# Patient Record
Sex: Male | Born: 1950 | Race: White | Hispanic: No | Marital: Married | Smoking: Never smoker
Health system: Southern US, Community
[De-identification: ages and names within clinical notes are randomized; demographics above are authoritative.]

## PROBLEM LIST (undated history)

## (undated) DIAGNOSIS — M109 Gout, unspecified: Secondary | ICD-10-CM

## (undated) DIAGNOSIS — G4730 Sleep apnea, unspecified: Secondary | ICD-10-CM

## (undated) DIAGNOSIS — M1990 Unspecified osteoarthritis, unspecified site: Secondary | ICD-10-CM

## (undated) DIAGNOSIS — I10 Essential (primary) hypertension: Secondary | ICD-10-CM

## (undated) DIAGNOSIS — J45909 Unspecified asthma, uncomplicated: Secondary | ICD-10-CM

## (undated) DIAGNOSIS — I499 Cardiac arrhythmia, unspecified: Secondary | ICD-10-CM

## (undated) DIAGNOSIS — E7800 Pure hypercholesterolemia, unspecified: Secondary | ICD-10-CM

## (undated) DIAGNOSIS — K5730 Diverticulosis of large intestine without perforation or abscess without bleeding: Secondary | ICD-10-CM

## (undated) DIAGNOSIS — E119 Type 2 diabetes mellitus without complications: Secondary | ICD-10-CM

## (undated) DIAGNOSIS — A809 Acute poliomyelitis, unspecified: Secondary | ICD-10-CM

## (undated) DIAGNOSIS — Z87442 Personal history of urinary calculi: Secondary | ICD-10-CM

## (undated) DIAGNOSIS — I4891 Unspecified atrial fibrillation: Secondary | ICD-10-CM

## (undated) DIAGNOSIS — R569 Unspecified convulsions: Secondary | ICD-10-CM

## (undated) DIAGNOSIS — G4733 Obstructive sleep apnea (adult) (pediatric): Secondary | ICD-10-CM

## (undated) HISTORY — DX: Cardiac arrhythmia, unspecified: I49.9

## (undated) HISTORY — DX: Unspecified atrial fibrillation: I48.91

## (undated) HISTORY — PX: OTHER SURGICAL HISTORY: SHX169

## (undated) HISTORY — DX: Type 2 diabetes mellitus without complications: E11.9

## (undated) HISTORY — DX: Essential (primary) hypertension: I10

## (undated) HISTORY — DX: Obstructive sleep apnea (adult) (pediatric): G47.33

---

## 2006-09-10 ENCOUNTER — Ambulatory Visit (HOSPITAL_COMMUNITY): Admission: RE | Admit: 2006-09-10 | Discharge: 2006-09-11 | Payer: Self-pay | Admitting: Specialist

## 2009-01-29 HISTORY — PX: SHOULDER ARTHROSCOPY: SHX128

## 2009-01-29 HISTORY — PX: TOTAL KNEE ARTHROPLASTY: SHX125

## 2009-09-12 ENCOUNTER — Ambulatory Visit
Admission: RE | Admit: 2009-09-12 | Discharge: 2009-09-12 | Payer: Self-pay | Source: Home / Self Care | Admitting: Specialist

## 2010-02-20 LAB — URINALYSIS, ROUTINE W REFLEX MICROSCOPIC
Bilirubin Urine: NEGATIVE
Hgb urine dipstick: NEGATIVE
Ketones, ur: NEGATIVE mg/dL
Nitrite: NEGATIVE
Specific Gravity, Urine: 1.018 (ref 1.005–1.030)
Urobilinogen, UA: 1 mg/dL (ref 0.0–1.0)
pH: 5.5 (ref 5.0–8.0)

## 2010-02-20 LAB — CBC
HCT: 40.4 % (ref 39.0–52.0)
Hemoglobin: 14.3 g/dL (ref 13.0–17.0)
MCH: 31.3 pg (ref 26.0–34.0)
MCV: 88.4 fL (ref 78.0–100.0)
RBC: 4.57 MIL/uL (ref 4.22–5.81)
WBC: 7.3 10*3/uL (ref 4.0–10.5)

## 2010-02-20 LAB — PROTIME-INR: INR: 1.02 (ref 0.00–1.49)

## 2010-02-20 LAB — COMPREHENSIVE METABOLIC PANEL
Albumin: 4 g/dL (ref 3.5–5.2)
Alkaline Phosphatase: 57 U/L (ref 39–117)
BUN: 20 mg/dL (ref 6–23)
CO2: 28 mEq/L (ref 19–32)
Chloride: 101 mEq/L (ref 96–112)
Creatinine, Ser: 1.02 mg/dL (ref 0.4–1.5)
GFR calc non Af Amer: >60 mL/min (ref >60)
Potassium: 3.9 mEq/L (ref 3.5–5.1)
Total Bilirubin: 0.3 mg/dL (ref 0.3–1.2)

## 2010-02-20 LAB — DIFFERENTIAL
Basophils Relative: 0 % (ref 0–1)
Eosinophils Relative: 3 % (ref 0–5)
Lymphocytes Relative: 30 % (ref 12–46)
Monocytes Relative: 6 % (ref 3–12)
Neutro Abs: 4.5 10*3/uL (ref 1.7–7.7)
Neutrophils Relative %: 61 % (ref 43–77)

## 2010-02-20 LAB — SURGICAL PCR SCREEN: MRSA, PCR: NEGATIVE

## 2010-02-20 LAB — APTT: aPTT: 24 seconds (ref 24–37)

## 2010-02-23 ENCOUNTER — Inpatient Hospital Stay (HOSPITAL_COMMUNITY)
Admission: RE | Admit: 2010-02-23 | Discharge: 2010-02-26 | Payer: Self-pay | Source: Home / Self Care | Attending: Specialist | Admitting: Specialist

## 2010-02-23 LAB — TYPE AND SCREEN: ABO/RH(D): A NEG

## 2010-02-23 LAB — GLUCOSE, CAPILLARY
Glucose-Capillary: 165 mg/dL — ABNORMAL HIGH (ref 70–99)
Glucose-Capillary: 156 mg/dL — ABNORMAL HIGH (ref 70–99)
Glucose-Capillary: 180 mg/dL — ABNORMAL HIGH (ref 70–99)

## 2010-02-23 LAB — ABO/RH: ABO/RH(D): A NEG

## 2010-02-24 LAB — GLUCOSE, CAPILLARY
Glucose-Capillary: 169 mg/dL — ABNORMAL HIGH (ref 70–99)
Glucose-Capillary: 187 mg/dL — ABNORMAL HIGH (ref 70–99)
Glucose-Capillary: 160 mg/dL — ABNORMAL HIGH (ref 70–99)

## 2010-02-24 LAB — BASIC METABOLIC PANEL
CO2: 29 mEq/L (ref 19–32)
Chloride: 97 mEq/L (ref 96–112)
GFR calc Af Amer: >60 mL/min (ref >60)
Glucose, Bld: 186 mg/dL — ABNORMAL HIGH (ref 70–99)
Potassium: 3.8 mEq/L (ref 3.5–5.1)
Sodium: 135 mEq/L (ref 135–145)

## 2010-02-24 LAB — CBC
HCT: 38 % — ABNORMAL LOW (ref 39.0–52.0)
Hemoglobin: 13.1 g/dL (ref 13.0–17.0)
RBC: 4.27 MIL/uL (ref 4.22–5.81)

## 2010-02-24 LAB — PROTIME-INR: INR: 1.1 (ref 0.00–1.49)

## 2010-02-25 LAB — CBC
HCT: 36.4 % — ABNORMAL LOW (ref 39.0–52.0)
Hemoglobin: 12.8 g/dL — ABNORMAL LOW (ref 13.0–17.0)
MCHC: 35.2 g/dL (ref 30.0–36.0)
MCV: 88.3 fL (ref 78.0–100.0)

## 2010-02-25 LAB — GLUCOSE, CAPILLARY
Glucose-Capillary: 353 mg/dL — ABNORMAL HIGH (ref 70–99)
Glucose-Capillary: 164 mg/dL — ABNORMAL HIGH (ref 70–99)
Glucose-Capillary: 152 mg/dL — ABNORMAL HIGH (ref 70–99)

## 2010-02-25 LAB — BASIC METABOLIC PANEL
GFR calc Af Amer: >60 mL/min (ref >60)
GFR calc non Af Amer: >60 mL/min (ref >60)
Glucose, Bld: 178 mg/dL — ABNORMAL HIGH (ref 70–99)
Potassium: 3.3 mEq/L — ABNORMAL LOW (ref 3.5–5.1)
Sodium: 132 mEq/L — ABNORMAL LOW (ref 135–145)

## 2010-02-25 LAB — PROTIME-INR: Prothrombin Time: 17.1 seconds — ABNORMAL HIGH (ref 11.6–15.2)

## 2010-02-26 LAB — CBC
MCH: 30.7 pg (ref 26.0–34.0)
MCHC: 35.1 g/dL (ref 30.0–36.0)
Platelets: 152 10*3/uL (ref 150–400)

## 2010-02-26 LAB — PROTIME-INR: Prothrombin Time: 18.4 seconds — ABNORMAL HIGH (ref 11.6–15.2)

## 2010-02-26 LAB — BASIC METABOLIC PANEL
BUN: 17 mg/dL (ref 6–23)
CO2: 30 mEq/L (ref 19–32)
Calcium: 8.5 mg/dL (ref 8.4–10.5)
GFR calc non Af Amer: 53 mL/min — ABNORMAL LOW (ref >60)
Glucose, Bld: 170 mg/dL — ABNORMAL HIGH (ref 70–99)

## 2010-03-06 NOTE — Op Note (Signed)
NAME:  Miguel Cox, Miguel Cox NO.:  [id number]  MEDICAL RECORD NO.:  [id number]          PATIENT TYPE:  INP  LOCATION:  0003                         FACILITY:  [facility]  PHYSICIAN:  Jene Every, M.D.    DATE OF BIRTH:  12/16/1950  DATE OF PROCEDURE:  02/23/2010 DATE OF DISCHARGE:                              OPERATIVE REPORT   PREOPERATIVE DIAGNOSIS:  Degenerative disk disease, left knee.  POSTOPERATIVE DIAGNOSIS:  Degenerative disk disease, left knee.  PROCEDURE PERFORMED:  Left total knee arthroplasty.  ANESTHESIA:  General.  ASSISTANT:  Roma Schanz, P.A.  COMPONENTS:  Dupuy rotating platform for femur 5 tibia revision long stem tray, 10-mm insert, 38 patella.  BRIEF HISTORY:  DJD end-stage, varus deformity at 59, failing conservative treatment, indicated for total knee.  Risk and benefits were discussed including bleeding, infection, DVT, PE, suboptimal range of motion, need for revision, anesthetic complications, etc.  DESCRIPTION OF PROCEDURE:  The patient in supine position after induction of adequate anesthesia, 1 g vancomycin.  He had received 2 g of Kefzol.  Left lower extremity prepped and draped and exsanguinated in usual sterile fashion.  Thigh tourniquet inflated to 325 mmHg.  Knee was flexed.  Midline incision made, full thickness flaps, medial parapatellar arthrotomy.  Patella everted.  Elevated soft tissues medially preserving the medial collateral ligament attachment.  Flexed the knee.  Tricompartmental osteoarthrosis is noted.  Medial lateral menisci remnants removed as was the ACL.  Step drill utilized to enter the femoral canal, irrigated, 5-degree left was placed, 10 mm off the distal femur.  Oscillating saw utilized to perform a cut.  Soft tissue protected at all times.  Then measured off the anterior cortex to a 4. This was then marked, and the distal femoral cutting block was then applied.  Anterior-posterior chamfer cuts  performed with soft tissues well protected.  He is a large individual, although we protected the soft tissues at all times.  Attention turned towards the tibia.  Tibia was flexed.  Mchale was utilized.  External alignment guide was utilized bisecting the ankle 0-degree slope, 4 off the medial tibial plateau which was the defect on the medial third of tibial tubercle.  Pan oscillating saw utilized, performed a tibial cut.  We checked flexion/extension gaps, they were equivalent at 10 mm.  Checked posteriorly and removed the remnants of medial lateral meniscus cauterizing the geniculates.  Attention turned towards completing the tibia, it was subluxed.  Knee was flexed.  Placed a 4 and then a 5, which optimally covered the tibia just medial to the tibial tubercle. Center drill was utilized, punch guide applied as well for the long stem.  Next, attention turned towards completing the femur.  Box cut on the distal femur bisecting the notch and the condyles, pinned, box cut performed, rasped.  Used a trial femur and then a 10 mm insert.  We had full extension, full flexion, good stability to varus and valgus stress at 0 and 30 degrees.  Good patellofemoral tracking.  Then turned our attention towards the patella, 25 mm in thickness, selected a 38, removed 9, 15 mm remaining.  Utilizing  a planer saw, the peg holes were drilled.  Trial patella placed.  Good patellofemoral tracking.  All instrumentation was then removed.  Pulsatile lavage in the joint.  Knee was then flexed, dried all surfaces.  Cement mixed in the back table. It was injected under pressure in the tibial canal and digitally pressurized, 5 tray long stem impacted in the tibia, 4 of femur.  Cement impacted on the femur, 10 mm trial insert.  Knee was held in extension. Axial load applied.  Redundant cement removed.  The patella was cemented.  Held the axial load throughout the curing of the cement. After curing of the cement, good  flexion, good extension, good stability to varus and valgus stress at 0 and 30 degrees.  Selected a 10 insert. We flexed it with the trial removed and meticulously removed all redundant cement.  The patient was fairly large, somewhat tight, and this was performed.  Wound copiously irrigated with antibiotic irrigation and regular irrigation, low-pressure pulsatile lavage. Inserted a 10-mm permanent insert.  Good flexion, good extension and good stability to varus valgus stressing at 0 and 30 degrees.  Negative anterior drawer.  Stable device.  Next, Hemovac was placed and brought out through a lateral stab wound in the skin.  Marcaine with epinephrine was placed in the wound, injected in the periosteum.  Repaired the patellar arthrotomy with #1 Vicryl interrupted figure-of-8 sutures, subcu with 2-0 Vicryl simple sutures.  Skin was reapproximated with staples.  Working without difficulty, good flexion to 90 degrees against gravity.  Wound was dressed sterilely.  Tourniquet was deflated at 2 hours on left lower extremity.  Good revascularization noted.  The patient tolerated procedure well with no complications.  Tourniquet time was 2 hours.     Jene Every, M.D.     Cordelia Pen  D:  02/23/2010  T:  02/23/2010  Job:  [id number]  Electronically Signed by Jene Every M.D. on 03/06/2010 02:15:18 PM

## 2010-03-06 NOTE — H&P (Signed)
NAME:  Miguel Cox, Miguel Cox NO.:  [id number]  MEDICAL RECORD NO.:  [id number]          PATIENT TYPE:  INP  LOCATION:  NA                           FACILITY:  [facility]  PHYSICIAN:  Jene Every, M.D.    DATE OF BIRTH:  Aug 10, 1950  DATE OF ADMISSION:  02/23/2010 DATE OF DISCHARGE:                             HISTORY & PHYSICAL   CHIEF COMPLAINT:  Left knee pain.  HISTORY:  Mr. Reeder is a pleasant 60 year old gentleman who has had fairly symptomatic knee pain since early in the fall.  He underwent a knee arthroscopy which showed grade 3-4 changes most notable along the medial compartment as well as patella.  The patient noted persistent pain following the arthroscopy and actually underwent EFLEX series with no relief of the symptoms.  He describes his pain pattern as disabling, again with grade 3-4 changes noted on arthroscopy, so this time the patient would benefit from total knee arthroplasty.  The risks and benefits of this were discussed with the patient, and he would like to proceed.  PAST MEDICAL HISTORY:  Significant for: 1. Hypertension. 2. Hypercholesteremia. 3. Asthma. 4. Diet-controlled diabetes. 5. Osteoarthritis. 6. Gout.  CURRENT MEDICATIONS:  Include: 1. Norco 7.5. 2. Amlodipine 10 mg daily. 3. Colchicine p.r.n. 4. Losartan and HCTZ 100/25 daily. 5. Metoprolol 100 mg b.i.d. 6. Diclofenac p.r.n. 7. Simvastatin 80 mg daily. 8. Diovan HCT 160/25 daily. 9. Aspirin.  ALLERGIES:  PENICILLIN.  The patient gets no relief with morphine.  PAST SURGICAL HISTORY: 1. Kidney stone ablation in 2000. 2. Right rotator cuff repair in 2008. 3. Left knee arthroscopy.  SOCIAL HISTORY:  The patient is married.  History of no tobacco or alcohol.  Wife will be caregiver following surgery.  Dr. Abigail Miyamoto is his primary care.  He did give medical clearance.  FAMILY HISTORY:  Father and brother deceased in an MVA.  Mother with history of cancer.  Daughter has  MS.  REVIEW OF SYSTEMS:  GENERAL:  The patient denies any fever, chills, night sweats or bleeding tendencies.  CNS:  No blurred or double vision, seizure, headache or paralysis.  RESPIRATORY:  No shortness of breath, productive cough or hemoptysis.  CARDIOVASCULAR:  Chest pain.  No history orthopnea.  GU:  No dysuria, hematuria or discharge.  GI:  No nausea, vomiting or diarrhea except with taking colchicine.  No melena. MUSCULOSKELETAL:  As per HPI.  PHYSICAL EXAMINATION:  VITAL SIGNS:  Pulse 66, respiratory rate 10, BP 146/86. GENERAL:  This is a an overweight gentleman sitting upright in no acute distress.  He does walk with slight antalgic gait utilizing a cane. HEENT:  Atraumatic, normocephalic.  Pupils equal, round and reactive to light.  EOMs intact. NECK:  Supple.  No lymphadenopathy. CHEST:  Clear to auscultation bilaterally.  No rhonchi, wheezes or rales. HEART:  Regular rate and rhythm without murmurs, gallops or rubs. ABDOMEN:  Protuberant, soft and nontender.  Bowel sounds x4. SKIN:  No rashes or lesions noted. EXTREMITIES:  In regard to the knee, the patient does have varus deformity.  He does note loss of range of motion, especially flexion. He is  tender along the medial joint line, there is mild diffusion.  IMPRESSION:  Degenerative joint disease, left knee.  PLAN:  The patient will be admitted to undergo a left total knee arthroplasty.     Roma Schanz, P.A.   ______________________________ Jene Every, M.D.    CS/MEDQ  D:  02/17/2010  T:  02/17/2010  Job:  [id number]  Electronically Signed by Roma Schanz P.A. on 02/27/2010 12:41:10 PM Electronically Signed by Jene Every M.D. on 03/06/2010 02:15:16 PM

## 2010-03-20 NOTE — Discharge Summary (Signed)
NAMESTRUMMER, CANIPE NO.:  [id number]  MEDICAL RECORD NO.:  [id number]          PATIENT TYPE:  INP  LOCATION:  1615                         FACILITY:  [facility]  PHYSICIAN:  Jene Every, M.D.    DATE OF BIRTH:  [birth year]  DATE OF ADMISSION:  02/23/2010 DATE OF DISCHARGE:  02/26/2010                              DISCHARGE SUMMARY   ADMISSION DIAGNOSES: 1. Degenerative joint disease, left knee. 2. Hypertension. 3. Hypercholesterolemia. 4. Asthma. 5. Diet-controlled diabetes. 6. Osteoarthritis. 7. Gout.  DISCHARGE DIAGNOSES: 1. Degenerative joint disease, left knee, status post left total knee     arthroplasty. 2. Hypertension. 3. Hypercholesterolemia. 4. Asthma. 5. Diet-controlled diabetes. 6. Osteoarthritis. 7. Gout. 8. Resolved pain control issues.  HISTORY:  Mr. Miguel Cox is a pleasant 60 year old gentleman who is well known to our practice.  Unfortunately, he noted onset of pain in early fall, status post fall, underwent knee arthroscopy which showed grade 3- 4 changes, most notably along the medial compartment.  He had persistent pain and he underwent EFLEX series without significant relief of symptoms.  Due to the fact that his pain pattern is disabling and failure to progress with conservative treatment, it was recommend that he proceed with a total knee arthroplasty.  The risks and benefits of the surgery were discussed with the patient.  He does wish to proceed.  PROCEDURE NOTE:  The patient was taken to the OR, underwent left total knee arthroplasty.  SURGEON:  Jene Every, MD  ASSISTANT:  Roma Schanz, PA-C  ANESTHESIA:  General.  COMPLICATIONS:  None.  LABORATORY DATA:  Preoperative CBC showed white cell count of 7.3, hemoglobin 14.3, hematocrit 40.4.  This was monitored throughout the hospital course.  He did have a slight spike in his white cell count to 11.4; however, this decreased at the time of discharge to  10.6. Hemoglobin remained stable at discharge to 11.8 with hematocrit of 33.6. Routine coagulation studies done preoperatively showed PT of 13.6, INR of 1.02.  At the time of discharge, he had PT of 18.4 with INR of 1.51. The patient was bridged with Lovenox.  Platelet function assay was 87. Routine chemistries showed sodium 138, potassium 3.9, with a normal BUN and creatinine, slightly elevated glucose at 113 throughout hospital course.  These labs were followed.  At the time of discharge, sodium 133, potassium 3.2, with a glucose of 170, BUN and creatinine within normal range.  GFR is greater than 60 with slight decrease to 53 at the time of discharge.  Routine liver function tests were stable. Preoperative urinalysis was negative.  Blood type is A negative.  MRSA screen was negative.  However, staphylococcus screen showed positive. Preop chest x-ray showed no active disease.  HOSPITAL COURSE:  The patient was admitted, taken to the OR and underwent the above-stated procedure without difficulty.  He was then transferred to PACU and then to the orthopedic floor for continued postoperative care.  One Hemovac drain was placed intraoperatively.  The patient was placed on PCA analgesics for postoperative pain relief.  On postoperative day #1, the patient was having significant amount of pain, minimal relief with  PCA as well as p.o. medication without any chest pain.  At time of rounds, found the knee to be below the level of the heart with fairly significant swelling.  Vital signs were stable. Slight elevation in his blood pressure to 174/103 with pulse of 97. Incision was clean and dry.  Hemovac was discontinued later that day. Motor and neurovascular function was intact.  At that point, we did adjust his medication and placed him on a long-acting OxyContin as well as OxyIR 10 mg, also Ativan p.r.n.  We discussed strict elevation. Coumadin per Pharmacy.  Discharge planning was initiated as  well as PT/OT was started.  On postoperative day #2, the patient was doing significantly better as far as pain control.  Vital signs were stable. His labs were within normal range.  Incision was clean, dry and intact. No evidence of infection, decreased edema.  Calves soft, nontender without evidence of DVT.  The patient was continued on Coumadin and started on Lovenox to bridge.  The patient did well with physical therapy.  It was felt on postoperative day #3, he was stable to go home. Pain was well controlled with p.o. medications.  He unfortunately was having flare-up of his gout.  He was afebrile, vital signs were stable. Per the discharge note done by DO, the patient did have some erythema surrounding the incision as well as warmth around the knee.  Therefore, he was discharged on doxycycline for preventative measures and resumed on his gout medication.  DISPOSITION:  The patient is stable to be discharged to home with home health PT/OT.  He is to follow up with Dr. Shelle Iron in approximately 10-14 days for suture removal and x-ray.  ACTIVITY:  Strict elevation above the heart level 6 times a day for 20 minutes at a time. DISCHARGE MEDICATIONS:  As per medication reconciliation sheet.  In addition to doxycycline as well as Lovenox, colchicine for his gouty flare.  DIET:  As tolerated.  CONDITION ON DISCHARGE:  Stable.  FINAL DIAGNOSIS:  Status post left total knee arthroplasty.     Roma Schanz, P.A.   ______________________________ Jene Every, M.D.    CS/MEDQ  D:  03/10/2010  T:  03/10/2010  Job:  [id number]  Electronically Signed by Roma Schanz P.A. on 03/17/2010 11:20:49 AM Electronically Signed by Jene Every M.D. on 03/20/2010 07:52:51 AM

## 2010-04-14 LAB — POCT I-STAT 4, (NA,K, GLUC, HGB,HCT)
HCT: 43 % (ref 39.0–52.0)
Hemoglobin: 14.6 g/dL (ref 13.0–17.0)
Potassium: 4.2 mEq/L (ref 3.5–5.1)

## 2010-06-13 NOTE — Op Note (Signed)
NAMEANTWAUN, BUTH NO.:  [id number]   MEDICAL RECORD NO.:  [id number]          PATIENT TYPE:  OIB   LOCATION:  1614                         FACILITY:  [facility]   PHYSICIAN:  Jene Every, M.D.    DATE OF BIRTH:  03-01-1950   DATE OF PROCEDURE:  09/10/2006  DATE OF DISCHARGE:  09/11/2006                               OPERATIVE REPORT   PREOPERATIVE DIAGNOSIS:  Rotator cuff tear.   POSTOPERATIVE DIAGNOSIS:  Rotator cuff tear.   PROCEDURE PERFORMED:  1. Open acromioplasty.  2. Subacromial decompression and bursectomy.  3. Rotator cuff repair utilizing two Mitek suture anchors.   SURGEON:  Jene Every, M.D.   ASSISTANT:  Roma Schanz, P.A.-C.   FINDINGS:  Retracted tear of the supraspinatus, 1 cm.   BRIEF HISTORY AND INDICATIONS:  The patient is a 60 year old who  presented with pain, initially nontraumatic, though on the day of  surgical presentation he had recalled a fall three months ago that might  have hurt his shoulder.  Radiographs showed a large anterior acromial  spur and MRI indicating a tear of the supraspinatus, full thickness  retracted 1 cm, it is indicated for repair and subacromial  decompression.  The risks and benefits were discussed including  bleeding, infection, suboptimal range of motion, need for revision,  anesthetic complications, persistent pain, prolonged postoperative  immobilization, etc.   TECHNIQUE:  With the patient in the beach chair supine position, after  induction of adequate anesthesia and 1 gram of vancomycin, the right  shoulder and upper extremity was prepped and draped in the usual sterile  fashion.  An incision was made over the anterior aspect of the shoulder.  The subcutaneous tissue was dissected.  Electrocautery was utilized to  achieve hemostasis. The raphe between the anterolateral heads was  identified and divided approximately 3 cm on the anterolateral aspect of  the acromion up over the acromion,  subperiosteally elevated the  anterolateral aspect and the anteromedial aspect of the cuff of the  tendinous portion subperiosteally.  A large anterior acromial spur was  noted.  With the elements with the deltoid well protected, using an  oscillating saw and a rongeur to perform a acromioplasty to a type 1  acromion, excising the CA ligament.  The bursa was excised.  I digitally  lysed multiple adhesions in the subacromial space and the subdeltoid  space.  This was contributing to his pathology.  Inspection revealed a  full thickness tear of the supraspinatus retracted 1 cm.  I used the  [place] rongeur to perform a trough lateral to the articular surface near  the greater tuberosity and removed part of the greater tuberosity which  was felt to be impinging.  I mobilized the cuff digitally and advanced  it laterally 1 cm into the trough and used two Mitek suture anchors to  bone over approximately 2 cm away from each other.  Good excellent  purchase was noted.  Pullout then advanced it and held it and secured it  with the Ethibond sutures to the trough with a good surgical knot.  Full  coverage was  noted.  I ranged the shoulder without retraction on the  tear.  I then oversewed the lateral leaflet of the tendon to the  periosteum and residual cuff laterally with 0 Vicryl interrupted  sutures.  The wound was then copiously irrigated.  Inspection revealed  no impingement, good range, full coverage.  I then repaired the raphe  with #1 Vicryl figure-of-eight sutures, the subcutaneous tissue was  reapproximated with 2-0 simple sutures, the skin was reapproximated with  4-0 subcuticular Prolene.  The wound was reinforced with Steri-Strips.  A sterile dressing was applied.  The patient was placed supine on the  hospital bed, extubated without difficulty, and transported to the  recovery room in satisfactory condition.  He was placed in an abduction  pillow prior to that.  The patient tolerated the  procedure well with no  complications.  Minimal blood loss.      Jene Every, M.D.  Electronically Signed     JB/MEDQ  D:  09/10/2006  T:  09/11/2006  Job:  [id number]

## 2010-11-13 LAB — PROTIME-INR
INR: 0.9
Prothrombin Time: 11.8

## 2010-11-13 LAB — BASIC METABOLIC PANEL
BUN: 19
CO2: 25
GFR calc non Af Amer: >60
Glucose, Bld: 178 — ABNORMAL HIGH
Potassium: 3.7

## 2010-11-13 LAB — HEMOGLOBIN AND HEMATOCRIT, BLOOD: Hemoglobin: 14.6

## 2011-08-12 IMAGING — CR DG KNEE 1-2V PORT*L*
2 series · 2 of 2 positions shown · non-contrast
Comparison: 02/16/2010.

CLINICAL DATA: 59-year-old male status post total knee
arthroplasty.

PORTABLE LEFT KNEE - 1-2 VIEW

[view not recorded (1 of 2)]
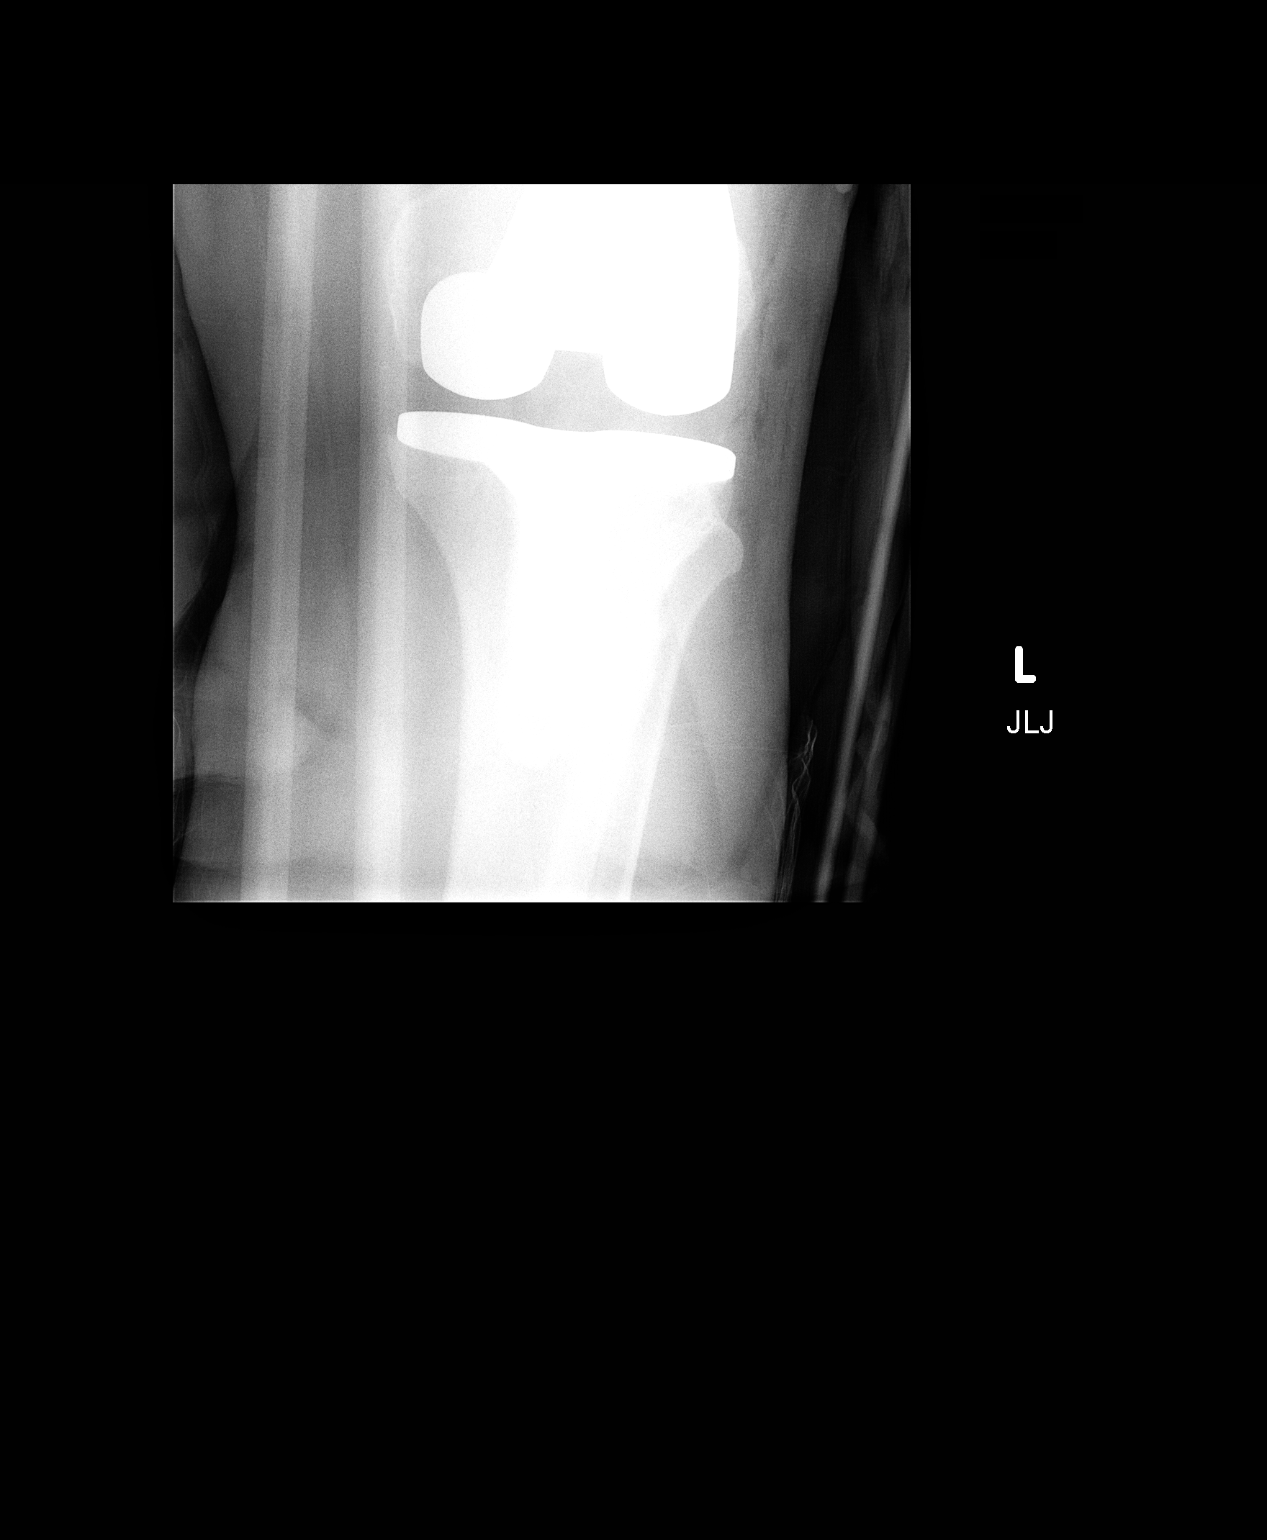

[view not recorded (2 of 2)]
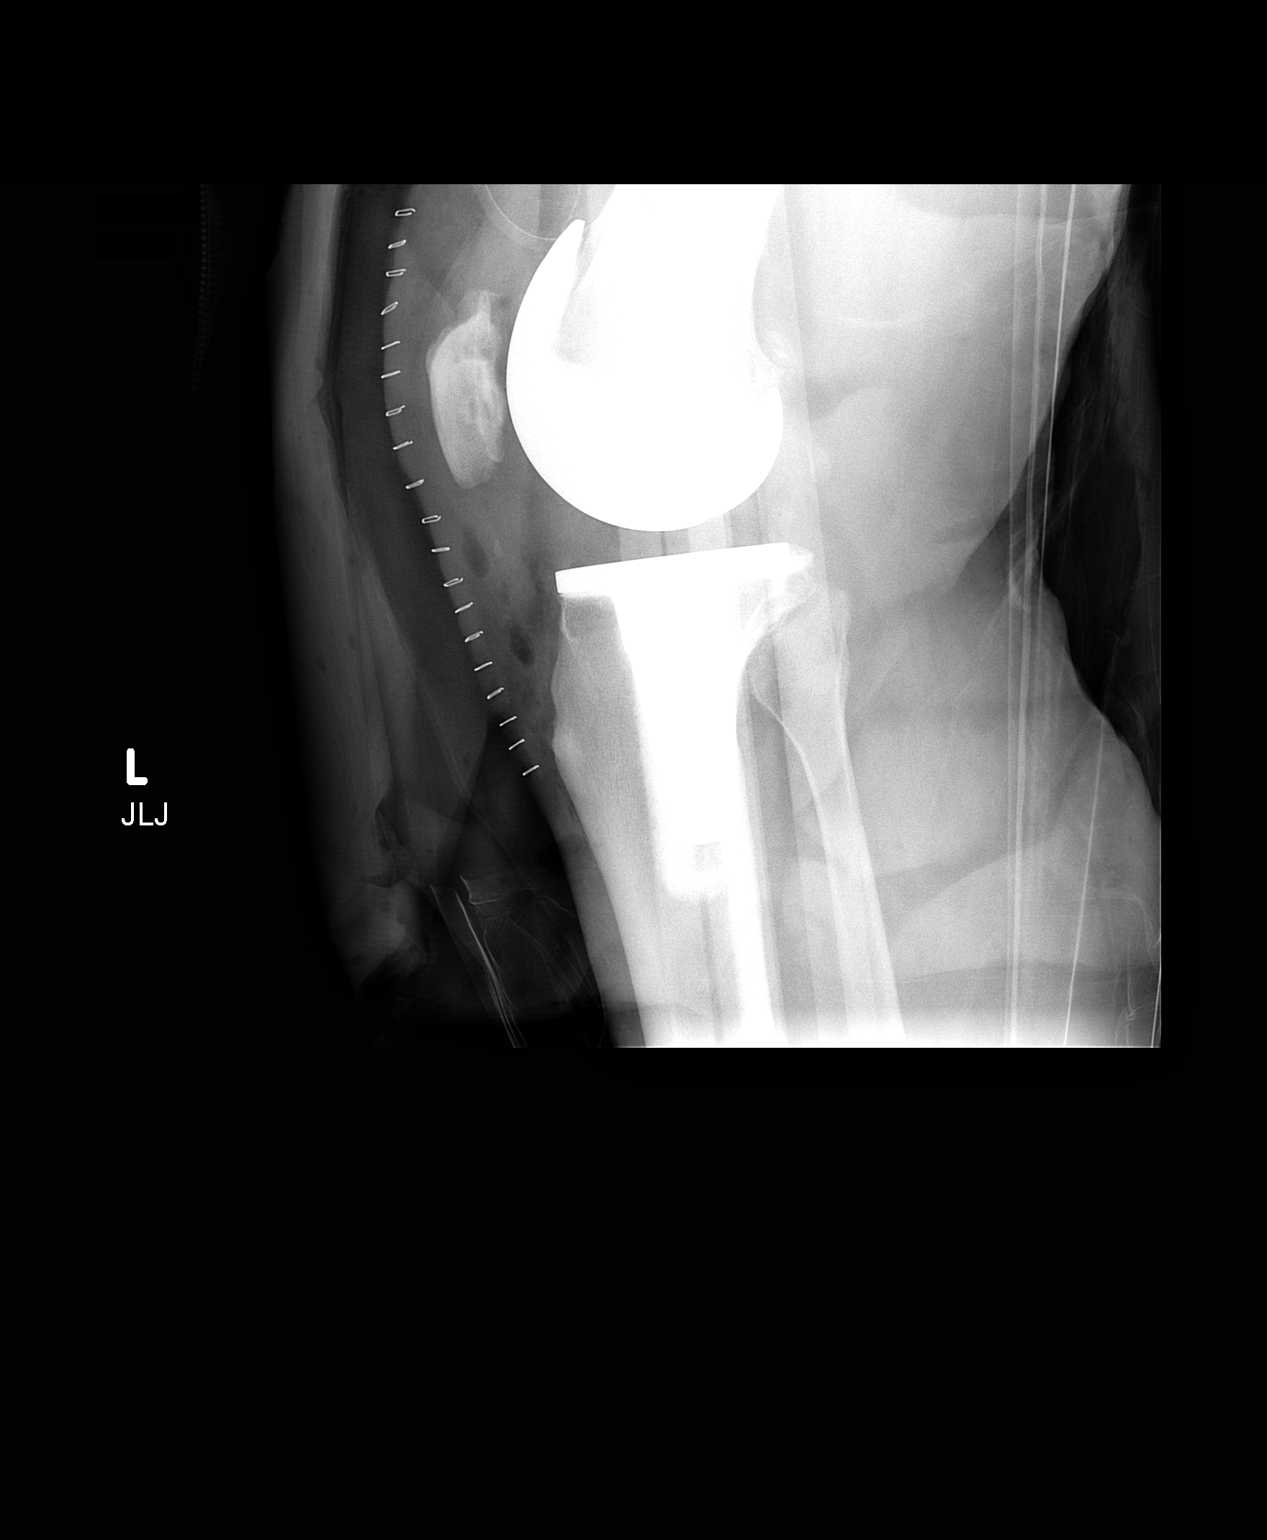

[2 of 2 positions shown; findings below may reference images not displayed]

FINDINGS: Portable AP and cross-table lateral views at 6678 hours.
Sequelae of total knee arthroplasty.  Midline anterior skin
staples.  Percutaneous postoperative drain in the suprapatellar
region.  Hardware appears intact and normally aligned.
Subcutaneous gas.
IMPRESSION: No adverse features status post left total knee arthroplasty.

## 2014-04-26 ENCOUNTER — Emergency Department (HOSPITAL_COMMUNITY)
Admission: EM | Admit: 2014-04-26 | Discharge: 2014-04-26 | Disposition: A | Discharge status: Home / Self Care | Payer: Medicare Other | Attending: Emergency Medicine | Admitting: Emergency Medicine

## 2014-04-26 ENCOUNTER — Encounter (HOSPITAL_COMMUNITY): Payer: Self-pay | Admitting: Emergency Medicine

## 2014-04-26 DIAGNOSIS — Z880 Allergy status to penicillin: Secondary | ICD-10-CM | POA: Diagnosis not present

## 2014-04-26 DIAGNOSIS — X58XXXA Exposure to other specified factors, initial encounter: Secondary | ICD-10-CM | POA: Insufficient documentation

## 2014-04-26 DIAGNOSIS — M109 Gout, unspecified: Secondary | ICD-10-CM | POA: Diagnosis not present

## 2014-04-26 DIAGNOSIS — E119 Type 2 diabetes mellitus without complications: Secondary | ICD-10-CM | POA: Diagnosis not present

## 2014-04-26 DIAGNOSIS — Z7982 Long term (current) use of aspirin: Secondary | ICD-10-CM | POA: Insufficient documentation

## 2014-04-26 DIAGNOSIS — T781XXA Other adverse food reactions, not elsewhere classified, initial encounter: Secondary | ICD-10-CM | POA: Insufficient documentation

## 2014-04-26 DIAGNOSIS — Z8669 Personal history of other diseases of the nervous system and sense organs: Secondary | ICD-10-CM | POA: Diagnosis not present

## 2014-04-26 DIAGNOSIS — R05 Cough: Secondary | ICD-10-CM | POA: Diagnosis not present

## 2014-04-26 DIAGNOSIS — E780 Pure hypercholesterolemia: Secondary | ICD-10-CM | POA: Insufficient documentation

## 2014-04-26 DIAGNOSIS — T7840XA Allergy, unspecified, initial encounter: Secondary | ICD-10-CM

## 2014-04-26 DIAGNOSIS — M1990 Unspecified osteoarthritis, unspecified site: Secondary | ICD-10-CM | POA: Diagnosis not present

## 2014-04-26 DIAGNOSIS — Z8612 Personal history of poliomyelitis: Secondary | ICD-10-CM | POA: Diagnosis not present

## 2014-04-26 DIAGNOSIS — Z8719 Personal history of other diseases of the digestive system: Secondary | ICD-10-CM | POA: Diagnosis not present

## 2014-04-26 DIAGNOSIS — Z79899 Other long term (current) drug therapy: Secondary | ICD-10-CM | POA: Diagnosis not present

## 2014-04-26 DIAGNOSIS — J45901 Unspecified asthma with (acute) exacerbation: Secondary | ICD-10-CM | POA: Insufficient documentation

## 2014-04-26 DIAGNOSIS — Y998 Other external cause status: Secondary | ICD-10-CM | POA: Insufficient documentation

## 2014-04-26 DIAGNOSIS — R21 Rash and other nonspecific skin eruption: Secondary | ICD-10-CM | POA: Diagnosis present

## 2014-04-26 DIAGNOSIS — I10 Essential (primary) hypertension: Secondary | ICD-10-CM | POA: Insufficient documentation

## 2014-04-26 DIAGNOSIS — Y9389 Activity, other specified: Secondary | ICD-10-CM | POA: Diagnosis not present

## 2014-04-26 DIAGNOSIS — Y9289 Other specified places as the place of occurrence of the external cause: Secondary | ICD-10-CM | POA: Insufficient documentation

## 2014-04-26 HISTORY — DX: Sleep apnea, unspecified: G47.30

## 2014-04-26 HISTORY — DX: Type 2 diabetes mellitus without complications: E11.9

## 2014-04-26 HISTORY — DX: Unspecified osteoarthritis, unspecified site: M19.90

## 2014-04-26 HISTORY — DX: Unspecified asthma, uncomplicated: J45.909

## 2014-04-26 HISTORY — DX: Unspecified convulsions: R56.9

## 2014-04-26 HISTORY — DX: Gout, unspecified: M10.9

## 2014-04-26 HISTORY — DX: Essential (primary) hypertension: I10

## 2014-04-26 HISTORY — DX: Pure hypercholesterolemia, unspecified: E78.00

## 2014-04-26 HISTORY — DX: Diverticulosis of large intestine without perforation or abscess without bleeding: K57.30

## 2014-04-26 HISTORY — DX: Acute poliomyelitis, unspecified: A80.9

## 2014-04-26 MED ORDER — DIPHENHYDRAMINE HCL 50 MG/ML IJ SOLN
50.0000 mg | Freq: Once | INTRAMUSCULAR | Status: AC
  Administered 2014-04-26: 50 mg via INTRAVENOUS

## 2014-04-26 MED ORDER — DIPHENHYDRAMINE HCL 50 MG/ML IJ SOLN
INTRAMUSCULAR | Status: AC
  Filled 2014-04-26: qty 1

## 2014-04-26 MED ORDER — FAMOTIDINE IN NACL 20-0.9 MG/50ML-% IV SOLN
20.0000 mg | Freq: Once | INTRAVENOUS | Status: AC
  Administered 2014-04-26: 20 mg via INTRAVENOUS

## 2014-04-26 MED ORDER — EPINEPHRINE 0.3 MG/0.3ML IJ SOAJ
INTRAMUSCULAR | Status: AC
  Filled 2014-04-26: qty 0.3

## 2014-04-26 MED ORDER — IPRATROPIUM-ALBUTEROL 0.5-2.5 (3) MG/3ML IN SOLN
3.0000 mL | Freq: Once | RESPIRATORY_TRACT | Status: AC
  Administered 2014-04-26: 3 mL via RESPIRATORY_TRACT

## 2014-04-26 MED ORDER — METHYLPREDNISOLONE SODIUM SUCC 125 MG IJ SOLR
INTRAMUSCULAR | Status: AC
  Filled 2014-04-26: qty 2

## 2014-04-26 MED ORDER — METHYLPREDNISOLONE SODIUM SUCC 125 MG IJ SOLR
125.0000 mg | Freq: Once | INTRAMUSCULAR | Status: AC
  Administered 2014-04-26: 125 mg via INTRAVENOUS

## 2014-04-26 MED ORDER — PREDNISONE 20 MG PO TABS: 40.0000 mg | ORAL_TABLET | Freq: Every day | ORAL | Status: DC

## 2014-04-26 MED ORDER — ONDANSETRON HCL 4 MG/2ML IJ SOLN
INTRAMUSCULAR | Status: AC
  Administered 2014-04-26: 4 mg
  Filled 2014-04-26: qty 2

## 2014-04-26 MED ORDER — IPRATROPIUM BROMIDE 0.02 % IN SOLN
RESPIRATORY_TRACT | Status: AC
  Administered 2014-04-26: 0.5 mg
  Filled 2014-04-26: qty 2.5

## 2014-04-26 MED ORDER — FAMOTIDINE IN NACL 20-0.9 MG/50ML-% IV SOLN
INTRAVENOUS | Status: AC
  Filled 2014-04-26: qty 50

## 2014-04-26 MED ORDER — ALBUTEROL SULFATE (2.5 MG/3ML) 0.083% IN NEBU
INHALATION_SOLUTION | RESPIRATORY_TRACT | Status: AC
  Administered 2014-04-26: 2.5 mg
  Filled 2014-04-26: qty 3

## 2014-04-26 MED ORDER — EPINEPHRINE 0.3 MG/0.3ML IJ SOAJ: 0.3000 mg | Freq: Once | INTRAMUSCULAR | Status: AC

## 2014-04-26 NOTE — ED Notes (Signed)
Pt asleep and desats periodically in the 80's while he is sleeping.  His wife states he has undiagnosed sleep apnea.

## 2014-04-26 NOTE — ED Notes (Signed)
Unable to obtain temperature. Patient in respiratory distress and vomiting.

## 2014-04-26 NOTE — ED Notes (Signed)
Resting quietly with eyes shut.

## 2014-04-26 NOTE — ED Notes (Signed)
Pt states he feels better.  Resting with eyes shut.  No respiratory distress.   Lungs clear.

## 2014-04-26 NOTE — Discharge Instructions (Signed)
°Emergency Department Resource Guide °1) Find a Doctor and Pay Out of Pocket °Although you won't have to find out who is covered by your insurance plan, it is a good idea to ask around and get recommendations. You will then need to call the office and see if the doctor you have chosen will accept you as a new patient and what types of options they offer for patients who are self-pay. Some doctors offer discounts or will set up payment plans for their patients who do not have insurance, but you will need to ask so you aren't surprised when you get to your appointment. ° °2) Contact Your Local Health Department °Not all health departments have doctors that can see patients for sick visits, but many do, so it is worth a call to see if yours does. If you don't know where your local health department is, you can check in your phone book. The CDC also has a tool to help you locate your state's health department, and many state websites also have listings of all of their local health departments. ° °3) Find a Walk-in Clinic °If your illness is not likely to be very severe or complicated, you may want to try a walk in clinic. These are popping up all over the country in pharmacies, drugstores, and shopping centers. They're usually staffed by nurse practitioners or physician assistants that have been trained to treat common illnesses and complaints. They're usually fairly quick and inexpensive. However, if you have serious medical issues or chronic medical problems, these are probably not your best option. ° °No Primary Care Doctor: °- Call [facility] at  832-8000 - they can help you locate a primary care doctor that  accepts your insurance, provides certain services, etc. °- Physician Referral Service- 1-800-533-3463 ° °Chronic Pain Problems: °Organization         Address  Phone   Notes  °[facility]  (336) 297-2271 Patients need to be referred by their primary care doctor.  ° °Medication  Assistance: °Organization         Address  Phone   Notes  °[place] [facility] [address] °[place], [place] [address] (336) 641-8030 --Must be a resident of [place] °-- Must have NO insurance coverage whatsoever (no Medicaid/ Medicare, etc.) °-- The pt. MUST have a primary care doctor that directs their care regularly and follows them in the community °  °[facility]  (866) 331-1348   °[employer]  (888) 892-1162   ° °Agencies that provide inexpensive medical care: °Organization         Address  Phone   Notes  °[facility]  (336) 832-8035   °[facility]    (336) 832-7272   °[facility] [address] °[place], [place] [address] (336) 832-4777   °[facility] [address], °[place] (336) 271-4999   °[facility]    (336) 373-0678   °[facility]    (336) 272-1050   °[facility] ° [address], [place] Phone:  (336) 832-4444, Fax:  (336) 832-4440 Hours of Operation:  9 am - 6 pm, M-F.  Also accepts Medicaid/Medicare and self-pay.  °[facility] ° [address], [place] Phone: (336) 832-3150, Fax: (336) 832-3151. Hours of Operation:  8:30 am - 5:30 pm, M-F.  Also accepts Medicaid and self-pay.  °[facility] [place] [address], [place] Phone: (336) 878-6027   °[facility] [address], [place], [place] (336)723-1848, Ext. 123 Mondays & Thursdays: 7-9 AM.  First 15 patients are seen on a first come, first serve basis. °  ° °Medicaid-accepting [place] Providers: ° °Organization         Address  Phone   Notes  °[facility] [address], [place] (336) 641-2100 Also accepts self-pay patients.  °[facility] [address], [place] ° (336) 856-9996   °[facility] [address], [place]  (336) 288-8857   °[facility] [address] [place] [address], [place] (336) 299-7000   °Veita Bland [address], [place]  ° (336) 373-1557 Only accepts [place] Access Medicaid patients after they have their name applied to their card.  ° °Self-Pay (no insurance) in [place]: ° °Organization         Address  Phone   Notes  °Sickle Cell Patients, [facility] [address], [place] (336) 832-1970   °[facility] [address], [place] (336) 832-4400   °[facility] ° [address], [place] (336) 992-4800   °[facility]/Dr. Osei-Bonsu ° [address] [place] [address], [place] or [address], [place] (336) 841-8500 Phone number for both [place] and [place] locations is the same.  °[facility] [address], [place] (336) 299-0000   °[facility] [address] [place] [address], [place] or [address] (336) 852-7530 °(336) 878-2260   °[facility] [address], [place] (336) 350-1642, phone; (336) 294-5005, fax Sees patients 1st and 3rd Saturday of every month.  Must not qualify for public or private insurance (i.e. Medicaid, Medicare, [place] Health Choice, Veterans' Benefits) • Household income should be no more than 200% of the poverty level •The clinic cannot treat you if you are pregnant or think you are pregnant • Sexually transmitted diseases are not treated at the clinic.  ° ° °Dental Care: °Organization         Address  Phone  Notes  °[place] [facility] [address], [place] (336) 641-6152 Accepts children up to age 21 who are enrolled in Medicaid or [place] Health Choice; pregnant women with a Medicaid card; and children who have applied for Medicaid or [place] Health Choice, but were declined, whose parents can pay a reduced fee at time of service.  °[place]  [facility] [place]  [address], [place] (336) 641-7733 Accepts children up to age 21 who are enrolled in Medicaid or [place] Health Choice; pregnant women with a Medicaid card; and children who have applied for Medicaid or [place] Health Choice, but were declined, whose parents can pay a reduced fee at time of service.  °[facility] ° [address], [place] (336) 641-4533 Patients are seen by appointment only. Walk-ins are not accepted. [facility] will see patients 18 years of age and older. °Monday - Tuesday (8am-5pm) °Most Wednesdays (8:30-5pm) °$30 per visit, cash only  °[facility] ° [address], [place] (336) 641-4533 Patients are seen by appointment only. Walk-ins are not accepted. [facility] will see patients 18 years of age and older. °One   Wednesday Evening (Monthly: Volunteer Based).  $30 per visit, cash only  °[facility]  (919) 537-3737 for adults; Children under age 4, call Graduate Pediatric Dentistry at ([phone]) [phone]. Children aged 4-14, please call ([phone]) [phone] to request a pediatric application. ° Dental services are provided in all areas of dental care including fillings, crowns and bridges, complete and partial dentures, implants, gum treatment, root canals, and extractions. Preventive care is also provided. Treatment is provided to both adults and children. °Patients are selected via a lottery and there is often a waiting list. °  °[facility] [address], °[place] ° (336) 763-8833 [email or web address] °  °[facility] [address], [place], [place] (336)723-1848, Ext. 123 Second and Fourth Thursday of each month, opens at 6:30 AM; Clinic ends at 9 AM.  Patients are seen on a first-come first-served basis, and a limited number are seen during each clinic.  ° °[facility] ° [address], [place], [place] (336) 723-7904    Eligibility Requirements °You must have lived in [place], [place], or [place] counties for at least the last three months. °  You cannot be eligible for state or federal sponsored healthcare insurance, including Veterans Administration, Medicaid, or Medicare. °  You generally cannot be eligible for healthcare insurance through your employer.  °  How to apply: °Eligibility screenings are held every Tuesday and Wednesday afternoon from 1:00 pm until 4:00 pm. You do not need an appointment for the interview!  °[facility] [address], [place], [place] 336-631-2330   °[facility]  336-342-8273   °[facility]  336-703-3100   °[facility]  336-570-6415   ° °Behavioral Health Resources in the Community: °Intensive Outpatient Programs °Organization         Address  Phone  Notes  °[facility] [address], [place], [place] 336-878-6098   °[facility] [address], [place], [place] 336-832-9800   °[facility] [address], [place], [place] ° 336-882-2125   °[facility] [address],  °[place], [place] 1-800-853-5163 or 336-641-4981   °Substance Abuse Resources °Organization         Address  Phone  Notes  °[facility]  336-882-2125   °[facility]  336-784-9470   °[facility]  336-285-9073   °[facility]  336-845-3988   °Residential & Outpatient Substance Abuse Program  1-800-659-3381   °Psychological Services °Organization         Address  Phone  Notes  °[facility]  336- 832-9600   °[facility]  336- 378-7881   °[facility] [address], [place] 1-800-853-5163 or 336-641-4981   ° °Mobile Crisis Teams °Organization         Address  Phone  Notes  °[facility], Mobile Crisis Care Unit  1-877-626-1772   °Assertive °[facility] ° [address]  [place], [place] 336-834-9664   °Sharon DeEsch [address] °[place] 336-554-5454   ° °Self-Help/Support Groups °Organization         Address  Phone             Notes  °[facility]  - variety of support groups  336- 373-1402 Call for more information  °Narcotics Anonymous (NA), [facility] [address], °[place]  2 meetings at this location  ° °  Residential Treatment Programs Organization         Address  Phone  Notes  [facility] [address],    [place]  [phone]   [facility]  [address], [place] [phone], [place], [place] [phone]   [facility] [address], [place] [phone] Admissions: 8am-3pm M-F  [facility] [address]    [place], [place] [phone]   [facility] [address], [place], [place] [phone]   [facility] [address]  [place], [place] [phone]   [facility] [address], [place], [place] [phone]   [facility] [address]  [place], [place] [phone])[phone] or (980) [phone]   [facility] [address] [place], [place] [phone] Accepts Medicaid  [facility] [address]  [place] [phone] Substance Abuse/Addiction Treatment   [facility] Resources Organization         Address  Phone  Notes  [facility]  740-033-9682(888) [phone]   Angie FavaJulie Brannon, PhD [address], [address] [place], [place]   [phone](336) ([phone]) [phone] or ([phone](336) [phone]   [facility]   [address] [place], [place] [phone](336) [phone]   [facility] [address], [place], [place] [phone](336) [phone] Insurance/Medicaid/sponsorship through [facility] [address]                                    [place], [place] [phone](336) [phone] Therapy/tele-psych/case    [facility] [address]   [place], [place] [phone](336) [phone]    Dr. Lolly MustacheArfeen  [phone](336) [phone]   [facility] [place]  [employer] [facility] 1) [address], [place] 2) [address], [place] 3)  [address], [place] [phone](336) [phone](336) [phone]  [phone](336) [phone]   [facility] [place] Child Abuse Hotline [phone](336) [phone] or 7121566441(336) [phone] (After Hours)      Take the prescriptions as directed.  Take over the counter benadryl, as directed on packaging, as needed for itching.  If the benadryl is too sedating, take an over the counter non-sedating antihistamine such as claritin, allegra or zyrtec, as directed on packaging.  Call your regular medical doctor tomorrow to schedule a follow up appointment within the next 2 days. Call the Dermatologist tomorrow to schedule a follow up appointment within the next week.  Return to the Emergency Department immediately sooner if worsening.

## 2014-04-26 NOTE — ED Notes (Signed)
Was eating at [employer], starting coughing.  Was eating hush puppies and small amount of chicken, green beans and potatoes and corn.  This is the three allergic reaction following eating within last 4 months.
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
## 2014-04-26 NOTE — ED Notes (Signed)
Pt sitting up in bed ,  Drinking water.  No distress.

## 2014-04-26 NOTE — ED Notes (Signed)
Pt ambulated around ER without difficulty.  sats stayed 96-98% while ambulating.

## 2018-11-24 ENCOUNTER — Ambulatory Visit: Payer: Self-pay | Admitting: Orthopedic Surgery

## 2018-11-25 ENCOUNTER — Ambulatory Visit: Payer: Medicare Other | Admitting: Cardiovascular Disease
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
## 2018-11-26 ENCOUNTER — Encounter: Payer: Self-pay | Admitting: Cardiovascular Disease

## 2018-11-26 ENCOUNTER — Ambulatory Visit (INDEPENDENT_AMBULATORY_CARE_PROVIDER_SITE_OTHER): Payer: Medicare Other | Admitting: Cardiovascular Disease

## 2018-11-26 ENCOUNTER — Other Ambulatory Visit: Payer: Self-pay

## 2018-11-26 VITALS — BP 136/72 | HR 65 | Ht 72.0 in | Wt 283.2 lb

## 2018-11-26 DIAGNOSIS — I10 Essential (primary) hypertension: Secondary | ICD-10-CM | POA: Diagnosis not present

## 2018-11-26 DIAGNOSIS — E782 Mixed hyperlipidemia: Secondary | ICD-10-CM

## 2018-11-26 DIAGNOSIS — Z01810 Encounter for preprocedural cardiovascular examination: Secondary | ICD-10-CM | POA: Diagnosis not present

## 2018-11-26 DIAGNOSIS — I4819 Other persistent atrial fibrillation: Secondary | ICD-10-CM

## 2018-11-26 LAB — TSH: TSH: 1.44 u[IU]/mL (ref 0.450–4.500)

## 2018-11-26 NOTE — Patient Instructions (Signed)
Medication Instructions:  Your physician recommends that you continue on your current medications as directed. Please refer to the Current Medication list given to you today.   *If you need a refill on your cardiac medications before your next appointment, please call your pharmacy*  Lab Work: Your physician recommends that you return for lab work today: TSH  If you have labs (blood work) drawn today and your tests are completely normal, you will receive your results only by: Marland Kitchen MyChart Message (if you have MyChart) OR . A paper copy in the mail If you have any lab test that is abnormal or we need to change your treatment, we will call you to review the results.  Testing/Procedures: Your physician has requested that you have an echocardiogram. Echocardiography is a painless test that uses sound waves to create images of your heart. It provides your doctor with information about the size and shape of your heart and how well your heart's chambers and valves are working. This procedure takes approximately one hour. There are no restrictions for this procedure.  Follow-Up: At [facility], you and your health needs are our priority.  As part of our continuing mission to provide you with exceptional heart care, we have created designated Provider Care Teams.  These Care Teams include your primary Cardiologist (physician) and Advanced Practice Providers (APPs -  Physician Assistants and Nurse Practitioners) who all work together to provide you with the care you need, when you need it.  Your next appointment:   1 month  The format for your next appointment:   In Person  Provider:   You may see Evalina Field, MD or one of the following Advanced Practice Providers on your designated Care Team:    Almyra Deforest, PA-C  Fabian Sharp, PA-C or   Roby Lofts, [place]

## 2018-11-28 ENCOUNTER — Ambulatory Visit: Payer: Self-pay | Admitting: Orthopedic Surgery
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
## 2018-11-28 NOTE — Pre-Procedure Instructions (Signed)
[id number] [place], [place] [address] [place] [address] Phone: [phone] Fax: [phone]      Your procedure is scheduled on Wednesday, November 4th.  Report to [facility] Main Entrance "A" at 8:00 A.M., and check in at the Admitting office.  Call this number if you have problems the morning of surgery:  [phone]  Call [phone] if you have any questions prior to your surgery date Monday-Friday 8am-4pm    Remember:  Do not eat or drink after midnight the night before your surgery    Take these medicines the morning of surgery with A SIP OF WATER  amLODipine (NORVASC)  Colchicine gabapentin (NEURONTIN) metoprolol (LOPRESSOR) predniSONE (DELTASONE)  Follow your surgeon's instructions on when to stop Aspirin.  If no instructions were given by your surgeon then you will need to call the office to get those instructions.    As of today, STOP taking any Aspirin (unless otherwise instructed by your surgeon), diclofenac sodium (VOLTAREN) 1 % GEL, Aleve, Naproxen, Ibuprofen, Motrin, Advil, Goody's, BC's, all herbal medications, fish oil, and all vitamins.   WHAT DO I DO ABOUT MY DIABETES MEDICATION?   Marland Kitchen Do not take metFORMIN (GLUCOPHAGE-XR) or glipiZIDE (GLUCOTROL XL) the morning of surgery.   How to Manage Your Diabetes Before and After Surgery  Why is it important to control my blood sugar before and after surgery? . Improving blood sugar levels before and after surgery helps healing and can limit problems. . A way of improving blood sugar control is eating a healthy diet by: o  Eating less sugar and carbohydrates o  Increasing activity/exercise o  Talking with your doctor about reaching your blood sugar goals . High blood sugars (greater than 180 mg/dL) can raise your risk of infections and slow your recovery, so you will need to focus on controlling your diabetes during the weeks before  surgery. . Make sure that the doctor who takes care of your diabetes knows about your planned surgery including the date and location.  How do I manage my blood sugar before surgery? . Check your blood sugar at least 4 times a day, starting 2 days before surgery, to make sure that the level is not too high or low. o Check your blood sugar the morning of your surgery when you wake up and every 2 hours until you get to the Short Stay unit. . If your blood sugar is less than 70 mg/dL, you will need to treat for low blood sugar: o Do not take insulin. o Treat a low blood sugar (less than 70 mg/dL) with  cup of clear juice (cranberry or apple), 4 glucose tablets, OR glucose gel. o Recheck blood sugar in 15 minutes after treatment (to make sure it is greater than 70 mg/dL). If your blood sugar is not greater than 70 mg/dL on recheck, call [phone] for further instructions. . Report your blood sugar to the short stay nurse when you get to Short Stay.  . If you are admitted to the hospital after surgery: o Your blood sugar will be checked by the staff and you will probably be given insulin after surgery (instead of oral diabetes medicines) to make sure you have good blood sugar levels. o The goal for blood sugar control after surgery is 80-180 mg/dL.     The Morning of Surgery  Do not wear jewelry.  Do not wear lotions, powders, or colognes,  or deodorant  Men may shave face and neck.  Do not bring valuables to the hospital.  [facility] is not responsible for any belongings or valuables.  If you are a smoker, DO NOT Smoke 24 hours prior to surgery IF you wear a CPAP at night please bring your mask, tubing, and machine the morning of surgery   Remember that you must have someone to transport you home after your surgery, and remain with you for 24 hours if you are discharged the same day.   Contacts, glasses, hearing aids, dentures or bridgework may not be worn into surgery.    Leave  your suitcase in the car.  After surgery it may be brought to your room.  For patients admitted to the hospital, discharge time will be determined by your treatment team.  Patients discharged the day of surgery will not be allowed to drive home.    Special instructions:   [facility]- Preparing For Surgery  Before surgery, you can play an important role. Because skin is not sterile, your skin needs to be as free of germs as possible. You can reduce the number of germs on your skin by washing with CHG (chlorahexidine gluconate) Soap before surgery.  CHG is an antiseptic cleaner which kills germs and bonds with the skin to continue killing germs even after washing.    Oral Hygiene is also important to reduce your risk of infection.  Remember - BRUSH YOUR TEETH THE MORNING OF SURGERY WITH YOUR REGULAR TOOTHPASTE  Please do not use if you have an allergy to CHG or antibacterial soaps. If your skin becomes reddened/irritated stop using the CHG.  Do not shave (including legs and underarms) for at least 48 hours prior to first CHG shower. It is OK to shave your face.  Please follow these instructions carefully.   1. Shower the NIGHT BEFORE SURGERY and the MORNING OF SURGERY with CHG Soap.   2. If you chose to wash your hair, wash your hair first as usual with your normal shampoo.  3. After you shampoo, rinse your hair and body thoroughly to remove the shampoo.  4. Use CHG as you would any other liquid soap. You can apply CHG directly to the skin and wash gently with a scrungie or a clean washcloth.   5. Apply the CHG Soap to your body ONLY FROM THE NECK DOWN.  Do not use on open wounds or open sores. Avoid contact with your eyes, ears, mouth and genitals (private parts). Wash Face and genitals (private parts)  with your normal soap.   6. Wash thoroughly, paying special attention to the area where your surgery will be performed.  7. Thoroughly rinse your body with warm water from the neck  down.  8. DO NOT shower/wash with your normal soap after using and rinsing off the CHG Soap.  9. Pat yourself dry with a CLEAN TOWEL.  10. Wear CLEAN PAJAMAS to bed the night before surgery, wear comfortable clothes the morning of surgery  11. Place CLEAN SHEETS on your bed the night of your first shower and DO NOT SLEEP WITH PETS.    Day of Surgery:  Do not apply any deodorants/lotions. Please shower the morning of surgery with the CHG soap  Please wear clean clothes to the hospital/surgery center.   Remember to brush your teeth WITH YOUR REGULAR TOOTHPASTE.   Please read over the following fact sheets that you were given.

## 2018-12-01 ENCOUNTER — Other Ambulatory Visit: Payer: Self-pay

## 2018-12-01 ENCOUNTER — Encounter (HOSPITAL_COMMUNITY)
Admission: RE | Admit: 2018-12-01 | Discharge: 2018-12-01 | Disposition: A | Discharge status: Home / Self Care | Payer: Medicare Other | Source: Ambulatory Visit | Attending: Orthopedic Surgery | Admitting: Orthopedic Surgery

## 2018-12-01 ENCOUNTER — Encounter (HOSPITAL_COMMUNITY): Payer: Self-pay

## 2018-12-01 ENCOUNTER — Ambulatory Visit (HOSPITAL_COMMUNITY)
Admission: RE | Admit: 2018-12-01 | Discharge: 2018-12-01 | Disposition: A | Discharge status: Home / Self Care | Payer: Medicare Other | Source: Ambulatory Visit | Attending: Orthopedic Surgery | Admitting: Orthopedic Surgery

## 2018-12-01 ENCOUNTER — Other Ambulatory Visit (HOSPITAL_COMMUNITY)
Admission: RE | Admit: 2018-12-01 | Discharge: 2018-12-01 | Disposition: A | Discharge status: Home / Self Care | Payer: Medicare Other | Source: Ambulatory Visit | Attending: Orthopedic Surgery | Admitting: Orthopedic Surgery

## 2018-12-01 DIAGNOSIS — Z20828 Contact with and (suspected) exposure to other viral communicable diseases: Secondary | ICD-10-CM | POA: Insufficient documentation

## 2018-12-01 DIAGNOSIS — Z01818 Encounter for other preprocedural examination: Secondary | ICD-10-CM | POA: Diagnosis not present

## 2018-12-01 DIAGNOSIS — I4891 Unspecified atrial fibrillation: Secondary | ICD-10-CM | POA: Diagnosis not present

## 2018-12-01 LAB — BASIC METABOLIC PANEL
Anion gap: 11 (ref 5–15)
BUN: 17 mg/dL (ref 8–23)
CO2: 25 mmol/L (ref 22–32)
Calcium: 9.3 mg/dL (ref 8.9–10.3)
Chloride: 103 mmol/L (ref 98–111)
Creatinine, Ser: 1.07 mg/dL (ref 0.61–1.24)
GFR calc Af Amer: >60 mL/min (ref >60)
GFR calc non Af Amer: >60 mL/min (ref >60)
Glucose, Bld: 198 mg/dL — ABNORMAL HIGH (ref 70–99)
Potassium: 3.8 mmol/L (ref 3.5–5.1)
Sodium: 139 mmol/L (ref 135–145)

## 2018-12-01 LAB — CBC
HCT: 44.9 % (ref 39.0–52.0)
Hemoglobin: 15 g/dL (ref 13.0–17.0)
MCH: 30.5 pg (ref 26.0–34.0)
MCHC: 33.4 g/dL (ref 30.0–36.0)
MCV: 91.4 fL (ref 80.0–100.0)
Platelets: 173 10*3/uL (ref 150–400)
RBC: 4.91 MIL/uL (ref 4.22–5.81)
RDW: 13.6 % (ref 11.5–15.5)
WBC: 9.9 10*3/uL (ref 4.0–10.5)
nRBC: 0 % (ref 0.0–0.2)

## 2018-12-01 LAB — URINALYSIS, ROUTINE W REFLEX MICROSCOPIC
Bilirubin Urine: NEGATIVE
Glucose, UA: NEGATIVE mg/dL
Hgb urine dipstick: NEGATIVE
Ketones, ur: NEGATIVE mg/dL
Leukocytes,Ua: NEGATIVE
Nitrite: NEGATIVE
Protein, ur: NEGATIVE mg/dL
Specific Gravity, Urine: 1.023 (ref 1.005–1.030)
pH: 5 (ref 5.0–8.0)

## 2018-12-01 LAB — GLUCOSE, CAPILLARY: Glucose-Capillary: 173 mg/dL — ABNORMAL HIGH (ref 70–99)

## 2018-12-01 LAB — PROTIME-INR
INR: 0.9 (ref 0.8–1.2)
Prothrombin Time: 12.4 seconds (ref 11.4–15.2)

## 2018-12-01 LAB — SARS CORONAVIRUS 2 (TAT 6-24 HRS): SARS Coronavirus 2: NEGATIVE

## 2018-12-01 LAB — SURGICAL PCR SCREEN
MRSA, PCR: NEGATIVE
Staphylococcus aureus: NEGATIVE

## 2018-12-01 LAB — APTT: aPTT: 24 seconds (ref 24–36)

## 2018-12-01 NOTE — Progress Notes (Signed)
PCP - DR Cherly Beach Cardiologist - DR ONEAL-----CLEARANCE DONE  s -    Chest x-ray - TODAY EKG - 10/20 Stress Test - NA ECHO - NA Cardiac Cath - NA  Sleep Study - YES CPAP - NO  Fasting Blood Sugar - 178 Checks Blood Sugar _1____ times a day   Aspirin Instructions:STOP   urCOVID TEST- TODAY   Anesthesia review: HEART HX,   MD REQ.  Patient denies shortness of breath, fever, cough and chest pain at PAT appointment   All instructions explained to the patient, with a verbal understanding of the material. Patient agrees to go over the instructions while at home for a better understanding. Patient also instructed to self quarantine after being tested for COVID-19. The opportunity to ask questions was provided.

## 2018-12-02 ENCOUNTER — Encounter (HOSPITAL_COMMUNITY): Payer: Self-pay | Admitting: Physician Assistant

## 2018-12-02 MED ORDER — VANCOMYCIN HCL 10 G IV SOLR
1500.0000 mg | INTRAVENOUS | Status: DC
  Filled 2018-12-02 (×2): qty 1500

## 2018-12-02 NOTE — Progress Notes (Signed)
Anesthesia Chart Review: Recent diagnosis of afib. Seen by cardiologist Dr. Audie Box for preop clearance. Per OV note 11/26/18 the pt is at low risk of periop CV complications but needs echo prior to surgery due to new afib. Unfortunately this has not been completed yet. I asked Dr. Audie Box if okay to proceed without, he advised he would prefer pt have echo prior to surgery. Dr. Rolena Infante' office made aware pt will need echo.  Miguel Cox [facility] Short Stay Center/Anesthesiology Phone [phone] 12/02/2018 12:35 PM

## 2018-12-03 ENCOUNTER — Encounter (HOSPITAL_COMMUNITY): Admission: RE | Payer: Self-pay | Source: Home / Self Care

## 2018-12-03 ENCOUNTER — Ambulatory Visit (HOSPITAL_COMMUNITY): Admission: RE | Admit: 2018-12-03 | Payer: Medicare Other | Source: Home / Self Care | Admitting: Orthopedic Surgery

## 2018-12-03 SURGERY — LUMBAR LAMINECTOMY/DECOMPRESSION MICRODISCECTOMY 1 LEVEL
Anesthesia: General | Laterality: Left

## 2018-12-15 ENCOUNTER — Other Ambulatory Visit (HOSPITAL_COMMUNITY): Payer: PRIVATE HEALTH INSURANCE
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
## 2019-01-09 ENCOUNTER — Ambulatory Visit: Payer: PRIVATE HEALTH INSURANCE | Admitting: Cardiovascular Disease

## 2019-02-26 ENCOUNTER — Other Ambulatory Visit: Payer: Self-pay

## 2019-02-26 DIAGNOSIS — I4819 Other persistent atrial fibrillation: Secondary | ICD-10-CM
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
## 2019-08-25 ENCOUNTER — Ambulatory Visit (INDEPENDENT_AMBULATORY_CARE_PROVIDER_SITE_OTHER): Payer: Medicare Other | Admitting: Cardiovascular Disease

## 2019-08-25 ENCOUNTER — Other Ambulatory Visit: Payer: Self-pay

## 2019-08-25 ENCOUNTER — Encounter: Payer: Self-pay | Admitting: Cardiovascular Disease

## 2019-08-25 VITALS — BP 128/70 | HR 61 | Ht 72.0 in | Wt 279.4 lb

## 2019-08-25 DIAGNOSIS — I10 Essential (primary) hypertension: Secondary | ICD-10-CM | POA: Diagnosis not present

## 2019-08-25 DIAGNOSIS — E782 Mixed hyperlipidemia: Secondary | ICD-10-CM

## 2019-08-25 DIAGNOSIS — I4819 Other persistent atrial fibrillation: Secondary | ICD-10-CM | POA: Diagnosis not present

## 2019-08-25 MED ORDER — APIXABAN 5 MG PO TABS: 5.0000 mg | ORAL_TABLET | Freq: Two times a day (BID) | ORAL | 3 refills | Status: DC

## 2019-08-25 MED ORDER — RIVAROXABAN 20 MG PO TABS: 20.0000 mg | ORAL_TABLET | Freq: Every day | ORAL | 3 refills | Status: DC

## 2019-08-25 NOTE — Patient Instructions (Addendum)
Medication Instructions:  Start Xarelto 20 mg daily   *If you need a refill on your cardiac medications before your next appointment, please call your pharmacy*  Testing/Procedures:  Echocardiogram - Your physician has requested that you have an echocardiogram. Echocardiography is a painless test that uses sound waves to create images of your heart. It provides your doctor with information about the size and shape of your heart and how well your hearts chambers and valves are working. This procedure takes approximately one hour. There are no restrictions for this procedure. This will be performed at [facility] in [place], [place]     Follow-Up: At [facility], you and your health needs are our priority.  As part of our continuing mission to provide you with exceptional heart care, we have created designated Provider Care Teams.  These Care Teams include your primary Cardiologist (physician) and Advanced Practice Providers (APPs -  Physician Assistants and Nurse Practitioners) who all work together to provide you with the care you need, when you need it.  We recommend signing up for the patient portal called "MyChart".  Sign up information is provided on this After Visit Summary.  MyChart is used to connect with patients for Virtual Visits (Telemedicine).  Patients are able to view lab/test results, encounter notes, upcoming appointments, etc.  Non-urgent messages can be sent to your provider as well.   To learn more about what you can do with MyChart, go to [email or web address].    Your next appointment:   6 month(s)  The format for your next appointment:   In Person  Provider:   Lennie Odor, MD

## 2019-08-28 ENCOUNTER — Telehealth: Payer: Self-pay | Admitting: Cardiovascular Disease

## 2019-08-28 NOTE — Telephone Encounter (Signed)
Spoke with patient and advised he contact [employer] patient assistance program for Xarelto to see if he may qualify. He was advised to call back if so, so we can help coordinate getting him an application  [email or web address]

## 2019-08-28 NOTE — Telephone Encounter (Signed)
Pt c/o medication issue:  1. Name of Medication: rivaroxaban (XARELTO) 20 MG TABS tablet  2. How are you currently taking this medication (dosage and times per day)? As directed  3. Are you having a reaction (difficulty breathing--STAT)? No  4. What is your medication issue? Patient states he is unable to afford the medication and he would like to discuss whether or not he is eligible for assistance with purchasing or an alternative. Please call to discuss.

## 2019-08-31 ENCOUNTER — Ambulatory Visit (HOSPITAL_COMMUNITY)
Admission: RE | Admit: 2019-08-31 | Discharge: 2019-08-31 | Disposition: A | Discharge status: Home / Self Care | Payer: Medicare Other | Source: Ambulatory Visit | Attending: Cardiovascular Disease | Admitting: Cardiovascular Disease

## 2019-08-31 ENCOUNTER — Other Ambulatory Visit: Payer: Self-pay

## 2019-08-31 DIAGNOSIS — I4819 Other persistent atrial fibrillation: Secondary | ICD-10-CM | POA: Diagnosis present

## 2019-08-31 LAB — ECHOCARDIOGRAM COMPLETE
AR max vel: 2.33 cm2
AV Area VTI: 2.5 cm2
AV Area mean vel: 2.57 cm2
AV Mean grad: 5.7 mmHg
AV Peak grad: 13 mmHg
Ao pk vel: 1.8 m/s
Area-P 1/2: 4.39 cm2
S' Lateral: 3.57 cm

## 2019-08-31 NOTE — Progress Notes (Signed)
*  PRELIMINARY RESULTS* Echocardiogram 2D Echocardiogram has been performed.  Stacey Drain 08/31/2019, 11:06 AM

## 2019-09-09 NOTE — Telephone Encounter (Signed)
Patient is bringing his paperwork for me to sign and fax back.

## 2019-11-18 ENCOUNTER — Other Ambulatory Visit: Payer: Self-pay | Admitting: Cardiovascular Disease
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
## 2020-02-23 ENCOUNTER — Ambulatory Visit: Payer: Medicare Other | Admitting: Cardiovascular Disease

## 2020-02-23 DIAGNOSIS — I10 Essential (primary) hypertension: Secondary | ICD-10-CM

## 2020-02-23 DIAGNOSIS — E782 Mixed hyperlipidemia: Secondary | ICD-10-CM

## 2020-02-23 DIAGNOSIS — I4819 Other persistent atrial fibrillation: Secondary | ICD-10-CM

## 2021-05-18 NOTE — Patient Instructions (Signed)
2  VISITORS  ARE ALLOWED TO COME WITH YOU AND STAY IN THE WAITING ROOM ONLY DURING PRE OP AND PROCEDURE.   ? ?**NO VISITORS ARE ALLOWED IN THE SHORT STAY AREA OR RECOVERY ROOM!!** ? ?IF YOU WILL BE ADMITTED INTO THE HOSPITAL YOU ARE ALLOWED ONLY 4 SUPPORT PEOPLE DURING VISITATION HOURS ONLY (7 AM -8PM)   ?The support person(s) must pass our screening, gel in and out, and wear a mask at all times, including in the patient?s room. ?Patients must also wear a mask when staff or their support person are in the room. ?Visitors GUEST BADGE MUST BE WORN VISIBLY  ?One adult visitor may remain with you overnight and MUST be in the room by 8 P.M. ?  ? ? Your procedure is scheduled on: 06/01/21 ? ? [facility] ? ?  Report to admitting at 7:15 AM ? ? Call this number if you have problems the morning of surgery [phone] ? ? Do not eat food :After Midnight. ? ? After Midnight you may have the following liquids until _7:00_____ AM/ DAY OF SURGERY ? ?Water ?Black Coffee (sugar ok, NO MILK/CREAM OR CREAMERS)  ?Tea (sugar ok, NO MILK/CREAM OR CREAMERS) regular and decaf                             ?Plain Jell-O (NO RED)                                           ?Fruit ices (not with fruit pulp, NO RED)                                     ?Popsicles (NO RED)                                                                  ?Juice: apple, WHITE grape, WHITE cranberry ?Sports drinks like Gatorade (NO RED) ?Clear broth(vegetable,chicken,beef) ? ?             ?  ?  ?The day of surgery:  ?Drink ONE (1)  G2 at 6:45 AM the morning of surgery. Drink in one sitting. Do not sip.  ?This drink was given to you during your hospital  ?pre-op appointment visit. ?Nothing else to drink after completing the  ? G2.at 7:00 ?  ?       If you have questions, please contact your surgeon?s office. ? ? ?  ?  ?Oral Hygiene is also important to reduce your risk of infection.                                    ?Remember - BRUSH  YOUR TEETH THE MORNING OF SURGERY WITH YOUR REGULAR TOOTHPASTE ? ? Do NOT smoke after Midnight ? ? Take these medicines the morning of surgery with A SIP OF WATER: Amlodipine, Metoprolol ? ?DO NOT TAKE ANY ORAL DIABETIC MEDICATIONS DAY OF YOUR SURGERY ? ?  Bring CPAP mask and tubing day of surgery. ?                  ?           You may not have any metal on your body including  jewelry, and body piercing ? ?           Do not wear  lotions, powders, perfumes/cologne, or deodorant ? ? ?            Men may shave face and neck. ? ? Do not bring valuables to the hospital. [facility] IS NOT ?            RESPONSIBLE   FOR VALUABLES. ? ? Contacts, dentures or bridgework may not be worn into surgery. ? ? Bring small overnight bag day of surgery. ?  ? Patients discharged on the day of surgery will not be allowed to drive home.  Someone NEEDS to stay with you for the first 24 hours after anesthesia. ? ? Special Instructions: Bring a copy of your healthcare power of attorney and living will documents  the day of surgery if you haven't scanned them before. ? ?            Please read over the following fact sheets you were given: IF YOU HAVE QUESTIONS ABOUT YOUR PRE-OP INSTRUCTIONS PLEASE CALL [phone] ? ?   [facility] - Preparing for Surgery ?Before surgery, you can play an important role.  Because skin is not sterile, your skin needs to be as free of germs as possible.  You can reduce the number of germs on your skin by washing with CHG (chlorahexidine gluconate) soap before surgery.  CHG is an antiseptic cleaner which kills germs and bonds with the skin to continue killing germs even after washing. ?Please DO NOT use if you have an allergy to CHG or antibacterial soaps.  If your skin becomes reddened/irritated stop using the CHG and inform your nurse when you arrive at Short Stay. You may shave your face/neck. ?Please follow these instructions carefully: ? 1.  Shower with CHG Soap the night before surgery and the  morning  of Surgery. ? 2.  If you choose to wash your hair, wash your hair first as usual with your  normal  shampoo. ? 3.  After you shampoo, rinse your hair and body thoroughly to remove the  shampoo.                        ?    4.  Use CHG as you would any other liquid soap.  You can apply chg directly  to the skin and wash  ?                     Gently with a scrungie or clean washcloth. ? 5.  Apply the CHG Soap to your body ONLY FROM THE NECK DOWN.   Do not use on face/ open      ?                     Wound or open sores. Avoid contact with eyes, ears mouth and genitals (private parts).  ?                     Engineering geologist,  Genitals (private parts) with your normal soap. ?  6.  Wash thoroughly, paying special attention to the area where your surgery  will be performed. ? 7.  Thoroughly rinse your body with warm water from the neck down. ? 8.  DO NOT shower/wash with your normal soap after using and rinsing off  the CHG Soap. ?               9.  Pat yourself dry with a clean towel. ?           10.  Wear clean pajamas. ?           11.  Place clean sheets on your bed the night of your first shower and do not  sleep with pets. ?Day of Surgery : ?Do not apply any lotions/deodorants the morning of surgery.  Please wear clean clothes to the hospital/surgery center. ? ?FAILURE TO FOLLOW THESE INSTRUCTIONS MAY RESULT IN THE CANCELLATION OF YOUR SURGERY ?PATIENT SIGNATURE_________________________________ ? ?NURSE SIGNATURE__________________________________ ? ?________________________________________________________________________  ? ?Incentive Spirometer ? ?An incentive spirometer is a tool that can help keep your lungs clear and active. This tool measures how well you are filling your lungs with each breath. Taking long deep breaths may help reverse or decrease the chance of developing breathing (pulmonary) problems (especially infection) following: ?A long period of time when you are unable to move or be active. ?BEFORE  THE PROCEDURE  ?If the spirometer includes an indicator to show your best effort, your nurse or respiratory therapist will set it to a desired goal. ?If possible, sit up straight or lean slightly forward. Try not to slouch. ?Hold the incentive spirometer in an upright position. ?INSTRUCTIONS FOR USE  ?Sit on the edge of your bed if possible, or sit up as far as you can in bed or on a chair. ?Hold the incentive spirometer in an upright position. ?Breathe out normally. ?Place the mouthpiece in your mouth and seal your lips tightly around it. ?Breathe in slowly and as deeply as possible, raising the piston or the ball toward the top of the column. ?Hold your breath for 3-5 seconds or for as long as possible. Allow the piston or ball to fall to the bottom of the column. ?Remove the mouthpiece from your mouth and breathe out normally. ?Rest for a few seconds and repeat Steps 1 through 7 at least 10 times every 1-2 hours when you are awake. Take your time and take a few normal breaths between deep breaths. ?The spirometer may include an indicator to show your best effort. Use the indicator as a goal to work toward during each repetition. ?After each set of 10 deep breaths, practice coughing to be sure your lungs are clear. If you have an incision (the cut made at the time of surgery), support your incision when coughing by placing a pillow or rolled up towels firmly against it. ?Once you are able to get out of bed, walk around indoors and cough well. You may stop using the incentive spirometer when instructed by your caregiver.  ?RISKS AND COMPLICATIONS ?Take your time so you do not get dizzy or light-headed. ?If you are in pain, you may need to take or ask for pain medication before doing incentive spirometry. It is harder to take a deep breath if you are having pain. ?AFTER USE ?Rest and breathe slowly and easily. ?It can be helpful to keep track of a log of your progress. Your caregiver can provide you with a simple  table to help with this. ?If you are  using the spirometer at home, follow these instructions: ?SEEK MEDICAL CARE IF:  ?You are having difficultly using the spirometer. ?You have trouble using the spiro

## 2021-05-19 ENCOUNTER — Other Ambulatory Visit: Payer: Self-pay

## 2021-05-19 ENCOUNTER — Encounter (HOSPITAL_COMMUNITY)
Admission: RE | Admit: 2021-05-19 | Discharge: 2021-05-19 | Disposition: A | Discharge status: Home / Self Care | Payer: Medicare Other | Source: Ambulatory Visit | Attending: Orthopedic Surgery | Admitting: Orthopedic Surgery

## 2021-05-19 ENCOUNTER — Encounter (HOSPITAL_COMMUNITY): Payer: Self-pay

## 2021-05-19 VITALS — BP 161/80 | HR 65 | Temp 97.7°F | Resp 18 | Ht 72.0 in | Wt 262.2 lb

## 2021-05-19 DIAGNOSIS — M1611 Unilateral primary osteoarthritis, right hip: Secondary | ICD-10-CM

## 2021-05-19 DIAGNOSIS — Z01818 Encounter for other preprocedural examination: Secondary | ICD-10-CM

## 2021-05-19 DIAGNOSIS — Z131 Encounter for screening for diabetes mellitus: Secondary | ICD-10-CM | POA: Diagnosis not present

## 2021-05-19 LAB — COMPREHENSIVE METABOLIC PANEL
ALT: 19 U/L (ref 0–44)
AST: 19 U/L (ref 15–41)
Albumin: 3.7 g/dL (ref 3.5–5.0)
Alkaline Phosphatase: 67 U/L (ref 38–126)
Anion gap: 10 (ref 5–15)
BUN: 28 mg/dL — ABNORMAL HIGH (ref 8–23)
CO2: 27 mmol/L (ref 22–32)
Calcium: 9.3 mg/dL (ref 8.9–10.3)
Chloride: 103 mmol/L (ref 98–111)
Creatinine, Ser: 1.14 mg/dL (ref 0.61–1.24)
GFR, Estimated: >60 mL/min (ref >60)
Glucose, Bld: 175 mg/dL — ABNORMAL HIGH (ref 70–99)
Potassium: 4 mmol/L (ref 3.5–5.1)
Sodium: 140 mmol/L (ref 135–145)
Total Bilirubin: 1 mg/dL (ref 0.3–1.2)
Total Protein: 7.3 g/dL (ref 6.5–8.1)

## 2021-05-19 LAB — TYPE AND SCREEN
ABO/RH(D): A NEG
Antibody Screen: NEGATIVE

## 2021-05-19 LAB — CBC
HCT: 45.2 % (ref 39.0–52.0)
Hemoglobin: 15 g/dL (ref 13.0–17.0)
MCH: 30.2 pg (ref 26.0–34.0)
MCHC: 33.2 g/dL (ref 30.0–36.0)
MCV: 90.9 fL (ref 80.0–100.0)
Platelets: 236 10*3/uL (ref 150–400)
RBC: 4.97 MIL/uL (ref 4.22–5.81)
RDW: 15 % (ref 11.5–15.5)
WBC: 9.3 10*3/uL (ref 4.0–10.5)
nRBC: 0 % (ref 0.0–0.2)

## 2021-05-19 LAB — SURGICAL PCR SCREEN
MRSA, PCR: NEGATIVE
Staphylococcus aureus: NEGATIVE

## 2021-05-19 LAB — GLUCOSE, CAPILLARY: Glucose-Capillary: 180 mg/dL — ABNORMAL HIGH (ref 70–99)

## 2021-05-19 NOTE — Progress Notes (Signed)
Anesthesia note: ? ?Bowel prep reminder:NA ? ?PCP - Dr. Samuella Cota ?Cardiologist -Dr. Cleophus Molt ?Other-  ? ?Chest x-ray - 2020-epic ?EKG - 05/19/21-chart ?Stress Test - no ?ECHO - 08/31/19-epic ?Cardiac Cath - no ? ?Pacemaker/ICD device last checked:NA ? ?Sleep Study - Yes ?CPAP - no ? ? ?Fasting Blood Sugar - 112-116 ?Checks Blood Sugar  weekly____ ? ?Blood Thinner:ASA 81 ?Blood Thinner Instructions: ?Aspirin Instructions:none ?Last Dose: ? ?Anesthesia review: yes ? ?Patient denies shortness of breath, fever, cough and chest pain at PAT appointment ?No SOB with any activities. Pt is using a cane. Asthma is seasonal and not a problem. ? ?Patient verbalized understanding of instructions that were given to them at the PAT appointment. Patient was also instructed that they will need to review over the PAT instructions again at home before surgery. yes ?

## 2021-05-24 ENCOUNTER — Encounter (HOSPITAL_COMMUNITY): Payer: Self-pay | Admitting: Physician Assistant

## 2021-05-31 ENCOUNTER — Ambulatory Visit (INDEPENDENT_AMBULATORY_CARE_PROVIDER_SITE_OTHER): Payer: Medicare Other | Admitting: Cardiology

## 2021-05-31 ENCOUNTER — Encounter: Payer: Self-pay | Admitting: Cardiology

## 2021-05-31 VITALS — BP 138/80 | HR 58 | Ht 72.0 in | Wt 268.0 lb

## 2021-05-31 DIAGNOSIS — I4819 Other persistent atrial fibrillation: Secondary | ICD-10-CM | POA: Diagnosis not present

## 2021-05-31 DIAGNOSIS — I10 Essential (primary) hypertension: Secondary | ICD-10-CM

## 2021-05-31 DIAGNOSIS — G4733 Obstructive sleep apnea (adult) (pediatric): Secondary | ICD-10-CM

## 2021-05-31 DIAGNOSIS — Z01810 Encounter for preprocedural cardiovascular examination: Secondary | ICD-10-CM

## 2021-05-31 MED ORDER — METOPROLOL TARTRATE 50 MG PO TABS: 50.0000 mg | ORAL_TABLET | Freq: Two times a day (BID) | ORAL | 1 refills | Status: DC

## 2021-05-31 NOTE — Progress Notes (Signed)
? ? ?Cardiology Office Note ? ?Date: 05/31/2021  ? ?IDROHNAN Cox, DOB 05-25-1950, MRN [id number] ? ?PCP:  Leeanne Rio, MD  ?Cardiologist:  Evalina Field, MD ?Electrophysiologist:  None  ? ?Chief Complaint  ?Patient presents with  ? Preoperative cardiac evaluation  ? ? ?History of Present Illness: ?Miguel Cox is a 71 y.o. male patient of Dr. Audie Box who walked in to the office today in need of a preoperative cardiac evaluation, was referred by Dr. Huel Cote.  He was placed on my schedule.  I reviewed his records and updated the chart.  He was last seen in July 2021 with diagnosis of persistent atrial fibrillation and CHA2DS2-VASc score of 3.  He was on beta-blocker for heart rate control at that time and it was recommended that he start Xarelto for stroke prophylaxis. Chart indicates that Dr. Huel Cote evaluated the patient on April 25 for preoperative assessment and was referred to cardiology.  Her note indicates that he was not taking any anticoagulation.  He was scheduled to undergo right total hip arthroplasty with Dr. Alvan Dame tomorrow under general anesthesia, however appears that this was canceled. ? ?He reports chronic right hip and knee pain, using a cane.  He still describes activities meeting or exceeding 4 METS without unusual breathlessness, no chest pain, no palpitations.  He has had no sudden dizziness or syncope. ? ?I personally reviewed his ECG today which shows atrial fibrillation at 66 bpm, rightward axis. ? ?History includes untreated OSA, he states that he cannot tolerate CPAP.  We discussed his medications, he states that he tried Xarelto initially but felt "terrible" reporting coldness, shakiness, and fatigue.  He does not want to take anticoagulation at this point and has been on aspirin instead.  He is on Lopressor 100 mg twice daily. ? ?Past Medical History:  ?Diagnosis Date  ? Atrial fibrillation ([employer] [facility])   ? Diagnosed 2020  ? Diverticula of intestine   ? DJD (degenerative joint  disease)   ? Essential hypertension   ? Gout   ? OSA (obstructive sleep apnea)   ? Intolerant of CPAP  ? Osteoarthritis   ? Polio   ? Type 2 diabetes mellitus ([place])   ? ? ?Past Surgical History:  ?Procedure Laterality Date  ? removal of toe nail    ? age 29 for fungus  ? SHOULDER ARTHROSCOPY Right 2011  ? right  ? TOTAL KNEE ARTHROPLASTY Left 01/29/2009  ? Dr. Tonita Cong  ? ? ?Current Outpatient Medications  ?Medication Sig Dispense Refill  ? amLODipine (NORVASC) 10 MG tablet Take 10 mg by mouth daily.  3  ? aspirin EC 81 MG tablet Take 81 mg by mouth daily.    ? atorvastatin (LIPITOR) 40 MG tablet Take 40 mg by mouth at bedtime.  3  ? Cholecalciferol (VITAMIN D3 PO) Take 1 tablet by mouth daily.    ? colchicine 0.6 MG tablet Take 0.6 mg by mouth daily as needed (gout).     ? Cyanocobalamin (B-12 PO) Take 1 tablet by mouth daily.    ? EPINEPHrine (EPIPEN 2-PAK) 0.3 mg/0.3 mL IJ SOAJ injection Inject 0.3 mLs (0.3 mg total) into the muscle once. 1 Device 1  ? glipiZIDE (GLUCOTROL XL) 10 MG 24 hr tablet Take 10 mg by mouth daily.    ? ibuprofen (ADVIL) 800 MG tablet Take 800 mg by mouth daily as needed for moderate pain.    ? Krill Oil 1000 MG CAPS Take 1,000 mg by mouth daily.     ?  losartan (COZAAR) 25 MG tablet Take 25 mg by mouth daily.  3  ? metFORMIN (GLUCOPHAGE-XR) 500 MG 24 hr tablet Take 1,000 mg by mouth 2 (two) times daily.    ? metoprolol tartrate (LOPRESSOR) 50 MG tablet Take 1 tablet (50 mg total) by mouth 2 (two) times daily. 180 tablet 1  ? Multiple Vitamin (MULTIVITAMIN WITH MINERALS) TABS tablet Take 1 tablet by mouth daily.    ? ?No current facility-administered medications for this visit.  ? ?Allergies:  Penicillins and Cefazolin  ? ?Social History: The patient  reports that he has never smoked. He has never used smokeless tobacco. He reports that he does not currently use alcohol. He reports that he does not use drugs.  ? ?Family History: The patient's family history includes Cancer in his mother; Heart  attack (age of onset: 47) in his brother.  ? ?ROS: No orthopnea or PND. ? ?Physical Exam: ?VS:  BP 138/80   Pulse (!) 58   Ht 6' (1.829 m)   Wt 268 lb (121.6 kg)   SpO2 94%   BMI 36.35 kg/m? , BMI Body mass index is 36.35 kg/m?. ? ?Wt Readings from Last 3 Encounters:  ?05/31/21 268 lb (121.6 kg)  ?05/19/21 262 lb 4 oz (119 kg)  ?08/25/19 (!) 279 lb 6.4 oz (126.7 kg)  ?  ?General: Patient appears comfortable at rest.  Using a cane. ?HEENT: Conjunctiva and lids normal, oropharynx clear. ?Neck: Supple, no elevated JVP or carotid bruits, no thyromegaly. ?Lungs: Clear to auscultation, nonlabored breathing at rest. ?Cardiac: Irregularly irregular, no S3 or significant systolic murmur, no pericardial rub. ?Abdomen: Soft, nontender, bowel sounds present. ?Extremities: Trace ankle edema, distal pulses 2+. ?Skin: Warm and dry. ?Musculoskeletal: No kyphosis. ?Neuropsychiatric: Alert and oriented x3, affect grossly appropriate. ? ?ECG:  An ECG dated 05/19/2021 was personally reviewed today and demonstrated:  Atrial fibrillation at 57 bpm. ? ?Recent Labwork: ?05/19/2021: ALT 19; AST 19; BUN 28; Creatinine, Ser 1.14; Hemoglobin 15.0; Platelets 236; Potassium 4.0; Sodium 140  ?March 2023: Hemoglobin A1c 6.5% ?September 2022: Cholesterol 171, glycerides 135, HDL 50, LDL 97 ? ?Other Studies Reviewed Today: ? ?Echocardiogram 08/31/2019: ? 1. Left ventricular ejection fraction, by estimation, is 60 to 65%. The  ?left ventricle has normal function. The left ventricle has no regional  ?wall motion abnormalities. There is moderate left ventricular hypertrophy.  ?Left ventricular diastolic  ?parameters are indeterminate.  ? 2. Right ventricular systolic function is normal. The right ventricular  ?size is normal.  ? 3. Left atrial size was severely dilated.  ? 4. Right atrial size was mild to moderately dilated.  ? 5. The mitral valve is normal in structure. Trivial mitral valve  ?regurgitation. No evidence of mitral stenosis.  ? 6. The  aortic valve is tricuspid. Aortic valve regurgitation is not  ?visualized. No aortic stenosis is present.  ? 7. Aortic dilatation noted. 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  ? 8. The inferior vena cava is normal in size with greater than 50%  ?respiratory variability, suggesting right atrial pressure of 3 mmHg.  ? ?Assessment and Plan: ? ?1.  Preoperative cardiac evaluation in a 71 year old male with permanent atrial fibrillation and CHA2DS2-VASc score of 3, untreated OSA due to intolerance of CPAP, essential hypertension, and type 2 diabetes mellitus.  Blood pressure control is reasonable today.  Recent hemoglobin A1c was 6.5%.  He does have relatively slow atrial fibrillation on high-dose beta-blocker, I did talk with him about  reducing Lopressor to 50 mg twice daily for now.  LVEF was 60 to 65% by evaluation in 2021.  RCRI perioperative cardiac risk index indicates class I low risk, 0.4% chance of major adverse cardiac event.  He describes activities meeting or exceeding 4 METS as well.  No further cardiac testing is indicated prior to planned right hip total arthroplasty under spinal anesthesia.  Forwarding note to Dr. Alvan Dame so that procedure can be rescheduled. ? ?2.  Permanent atrial fibrillation with CHA2DS2-VASc score of 3.  Left atrium severely dilated by last echocardiogram so would focus on heart rate control strategy instead of rhythm control.  He has declined anticoagulation as discussed above.  Remains at risk for stroke, I did talk with him about this again today.  Uncertain that he would be a good candidate for Watchman device as this would still require limited course of anticoagulation, and he is very hesitant to take any further anticoagulation going forward.  I will arrange follow-up for him with Dr. Audie Box for further discussion.  Lopressor is being cut back to 50 mg twice daily. ? ?3.  Untreated OSA, possibly also contributes to bradycardia and makes likelihood of  maintaining sinus rhythm very low. ? ?4.  Essential hypertension, also on Cozaar and Norvasc. ? ?5.  Mixed hyperlipidemia, on Lipitor with last LDL 50. ? ?Medication Adjustments/Labs and Tests Ordered: ?Current medi

## 2021-05-31 NOTE — Patient Instructions (Addendum)
Medication Instructions:  ?Your physician has recommended you make the following change in your medication:  ?Decrease metoprolol tartrate to 50 mg twice daily ?Continue other medications the same ? ?Labwork: ?none ? ?Testing/Procedures: ?none ? ?Follow-Up: ?Your physician recommends that you schedule a follow-up appointment in: 3 months with Dr. Flora Lipps ? ?Any Other Special Instructions Will Be Listed Below (If Applicable). ? ?If you need a refill on your cardiac medications before your next appointment, please call your pharmacy. ?

## 2021-06-01 ENCOUNTER — Ambulatory Visit (HOSPITAL_COMMUNITY): Admission: RE | Admit: 2021-06-01 | Payer: Medicare Other | Source: Ambulatory Visit | Admitting: Orthopedic Surgery

## 2021-06-01 ENCOUNTER — Encounter (HOSPITAL_COMMUNITY): Admission: RE | Payer: Self-pay | Source: Ambulatory Visit

## 2021-06-01 DIAGNOSIS — M1611 Unilateral primary osteoarthritis, right hip: Secondary | ICD-10-CM

## 2021-06-01 SURGERY — ARTHROPLASTY, HIP, TOTAL, ANTERIOR APPROACH
Anesthesia: Spinal | Site: Hip | Laterality: Right

## 2021-07-10 ENCOUNTER — Encounter (HOSPITAL_COMMUNITY): Admission: RE | Admit: 2021-07-10 | Payer: Medicare Other | Source: Ambulatory Visit

## 2021-07-19 NOTE — Patient Instructions (Addendum)
DUE TO COVID-19 ONLY TWO VISITORS  (aged 73 and older)  ARE ALLOWED TO COME WITH YOU AND STAY IN THE WAITING ROOM ONLY DURING PRE OP AND PROCEDURE.   **NO VISITORS ARE ALLOWED IN THE SHORT STAY AREA OR RECOVERY ROOM!!**  IF YOU WILL BE ADMITTED INTO THE HOSPITAL YOU ARE ALLOWED ONLY FOUR SUPPORT PEOPLE DURING VISITATION HOURS ONLY (7 AM -8PM)   The support person(s) must pass our screening, gel in and out, and wear a mask at all times, including in the patient's room. Patients must also wear a mask when staff or their support person are in the room. Visitors GUEST BADGE MUST BE WORN VISIBLY  One adult visitor may remain with you overnight and MUST be in the room by 8 P.M.     Your procedure is scheduled on: 07/07/21   Report to [facility] Main Entrance    Report to admitting at : 8:45 AM   Call this number if you have problems the morning of surgery [phone]   Do not eat food :After Midnight.   After Midnight you may have the following liquids until : 8:00 AM DAY OF SURGERY  Water Black Coffee (sugar ok, NO MILK/CREAM OR CREAMERS)  Tea (sugar ok, NO MILK/CREAM OR CREAMERS) regular and decaf                             Plain Jell-O (NO RED)                                           Fruit ices (not with fruit pulp, NO RED)                                     Popsicles (NO RED)                                                                  Juice: apple, WHITE grape, WHITE cranberry Sports drinks like Gatorade (NO RED) Clear broth(vegetable,chicken,beef)       Oral Hygiene is also important to reduce your risk of infection.                                    Remember - BRUSH YOUR TEETH THE MORNING OF SURGERY WITH YOUR REGULAR TOOTHPASTE   Do NOT smoke after Midnight   Take these medicines the morning of surgery with A SIP OF WATER: metoprolol,amlodipine.  DO NOT TAKE ANY ORAL DIABETIC MEDICATIONS DAY OF YOUR SURGERY  Bring CPAP mask and tubing day of surgery.                               You may not have any metal on your body including hair pins, jewelry, and body piercing             Do not wear lotions, powders, perfumes/cologne, or deodorant  Men may shave face and neck.   Do not bring valuables to the hospital. [place].   Contacts, dentures or bridgework may not be worn into surgery.   Bring small overnight bag day of surgery.   DO NOT [place]. PHARMACY WILL DISPENSE MEDICATIONS LISTED ON YOUR MEDICATION LIST TO YOU DURING YOUR ADMISSION [place]!    Patients discharged on the day of surgery will not be allowed to drive home.  Someone NEEDS to stay with you for the first 24 hours after anesthesia.   Special Instructions: Bring a copy of your healthcare power of attorney and living will documents         the day of surgery if you haven't scanned them before.              Please read over the following fact sheets you were given: IF YOU HAVE QUESTIONS ABOUT YOUR PRE-OP INSTRUCTIONS PLEASE CALL [phone]     [facility] - Preparing for Surgery Before surgery, you can play an important role.  Because skin is not sterile, your skin needs to be as free of germs as possible.  You can reduce the number of germs on your skin by washing with CHG (chlorahexidine gluconate) soap before surgery.  CHG is an antiseptic cleaner which kills germs and bonds with the skin to continue killing germs even after washing. Please DO NOT use if you have an allergy to CHG or antibacterial soaps.  If your skin becomes reddened/irritated stop using the CHG and inform your nurse when you arrive at Short Stay. Do not shave (including legs and underarms) for at least 48 hours prior to the first CHG shower.  You may shave your face/neck. Please follow these instructions carefully:  1.  Shower with CHG Soap the night before surgery and the  morning of Surgery.  2.  If  you choose to wash your hair, wash your hair first as usual with your  normal  shampoo.  3.  After you shampoo, rinse your hair and body thoroughly to remove the  shampoo.                           4.  Use CHG as you would any other liquid soap.  You can apply chg directly  to the skin and wash                       Gently with a scrungie or clean washcloth.  5.  Apply the CHG Soap to your body ONLY FROM THE NECK DOWN.   Do not use on face/ open                           Wound or open sores. Avoid contact with eyes, ears mouth and genitals (private parts).                       Wash face,  Genitals (private parts) with your normal soap.             6.  Wash thoroughly, paying special attention to the area where your surgery  will be performed.  7.  Thoroughly rinse your body with warm water from the neck down.  8.  DO NOT shower/wash with your normal soap after using and rinsing off  the CHG Soap.                9.  Pat yourself dry with a clean towel.            10.  Wear clean pajamas.            11.  Place clean sheets on your bed the night of your first shower and do not  sleep with pets. Day of Surgery : Do not apply any lotions/deodorants the morning of surgery.  Please wear clean clothes to the hospital/surgery center.  FAILURE TO FOLLOW THESE INSTRUCTIONS MAY RESULT IN THE CANCELLATION OF YOUR SURGERY PATIENT SIGNATURE_________________________________  NURSE SIGNATURE__________________________________  ________________________________________________________________________

## 2021-07-19 NOTE — Progress Notes (Signed)
Pt. Needs orders for upcoming surgery. ?

## 2021-07-20 ENCOUNTER — Encounter (HOSPITAL_COMMUNITY): Payer: Self-pay

## 2021-07-20 ENCOUNTER — Other Ambulatory Visit: Payer: Self-pay

## 2021-07-20 ENCOUNTER — Encounter (HOSPITAL_COMMUNITY)
Admission: RE | Admit: 2021-07-20 | Discharge: 2021-07-20 | Disposition: A | Discharge status: Home / Self Care | Payer: Medicare Other | Source: Ambulatory Visit | Attending: Orthopedic Surgery | Admitting: Orthopedic Surgery

## 2021-07-20 VITALS — BP 143/72 | HR 65 | Temp 97.8°F | Resp 18 | Ht 72.0 in | Wt 267.1 lb

## 2021-07-20 DIAGNOSIS — I2510 Atherosclerotic heart disease of native coronary artery without angina pectoris: Secondary | ICD-10-CM | POA: Insufficient documentation

## 2021-07-20 DIAGNOSIS — Z01818 Encounter for other preprocedural examination: Secondary | ICD-10-CM

## 2021-07-20 DIAGNOSIS — E119 Type 2 diabetes mellitus without complications: Secondary | ICD-10-CM | POA: Diagnosis not present

## 2021-07-20 DIAGNOSIS — Z01812 Encounter for preprocedural laboratory examination: Secondary | ICD-10-CM | POA: Insufficient documentation

## 2021-07-20 HISTORY — DX: Cardiac arrhythmia, unspecified: I49.9

## 2021-07-20 LAB — BASIC METABOLIC PANEL
Anion gap: 7 (ref 5–15)
BUN: 18 mg/dL (ref 8–23)
CO2: 26 mmol/L (ref 22–32)
Calcium: 8.8 mg/dL — ABNORMAL LOW (ref 8.9–10.3)
Chloride: 105 mmol/L (ref 98–111)
Creatinine, Ser: 1.01 mg/dL (ref 0.61–1.24)
GFR, Estimated: >60 mL/min (ref >60)
Glucose, Bld: 254 mg/dL — ABNORMAL HIGH (ref 70–99)
Potassium: 3.4 mmol/L — ABNORMAL LOW (ref 3.5–5.1)
Sodium: 138 mmol/L (ref 135–145)

## 2021-07-20 LAB — SURGICAL PCR SCREEN
MRSA, PCR: NEGATIVE
Staphylococcus aureus: NEGATIVE

## 2021-07-20 LAB — CBC
HCT: 42.9 % (ref 39.0–52.0)
Hemoglobin: 14.3 g/dL (ref 13.0–17.0)
MCH: 30.6 pg (ref 26.0–34.0)
MCHC: 33.3 g/dL (ref 30.0–36.0)
MCV: 91.9 fL (ref 80.0–100.0)
Platelets: 201 10*3/uL (ref 150–400)
RBC: 4.67 MIL/uL (ref 4.22–5.81)
RDW: 15.2 % (ref 11.5–15.5)
WBC: 9.1 10*3/uL (ref 4.0–10.5)
nRBC: 0 % (ref 0.0–0.2)

## 2021-07-20 LAB — HEMOGLOBIN A1C
Hgb A1c MFr Bld: 6.4 % — ABNORMAL HIGH (ref 4.8–5.6)
Mean Plasma Glucose: 136.98 mg/dL

## 2021-07-20 LAB — GLUCOSE, CAPILLARY: Glucose-Capillary: 238 mg/dL — ABNORMAL HIGH (ref 70–99)

## 2021-07-27 ENCOUNTER — Ambulatory Visit (HOSPITAL_COMMUNITY): Payer: Medicare Other

## 2021-07-27 ENCOUNTER — Observation Stay (HOSPITAL_COMMUNITY): Payer: Medicare Other

## 2021-07-27 ENCOUNTER — Ambulatory Visit (HOSPITAL_BASED_OUTPATIENT_CLINIC_OR_DEPARTMENT_OTHER): Payer: Medicare Other | Admitting: Certified Registered Nurse Anesthetist

## 2021-07-27 ENCOUNTER — Ambulatory Visit (HOSPITAL_COMMUNITY): Payer: Medicare Other | Admitting: Physician Assistant

## 2021-07-27 ENCOUNTER — Other Ambulatory Visit: Payer: Self-pay

## 2021-07-27 ENCOUNTER — Encounter (HOSPITAL_COMMUNITY): Payer: Self-pay | Admitting: Orthopedic Surgery

## 2021-07-27 ENCOUNTER — Encounter (HOSPITAL_COMMUNITY)
Admission: RE | Disposition: A | Discharge status: Home / Self Care | Payer: Self-pay | Source: Home / Self Care | Attending: Orthopedic Surgery

## 2021-07-27 ENCOUNTER — Observation Stay (HOSPITAL_COMMUNITY)
Admission: RE | Admit: 2021-07-27 | Discharge: 2021-07-28 | Disposition: A | Discharge status: Home / Self Care | Payer: Medicare Other | Attending: Orthopedic Surgery | Admitting: Orthopedic Surgery

## 2021-07-27 DIAGNOSIS — E119 Type 2 diabetes mellitus without complications: Secondary | ICD-10-CM | POA: Insufficient documentation

## 2021-07-27 DIAGNOSIS — Z79899 Other long term (current) drug therapy: Secondary | ICD-10-CM | POA: Insufficient documentation

## 2021-07-27 DIAGNOSIS — M1611 Unilateral primary osteoarthritis, right hip: Secondary | ICD-10-CM

## 2021-07-27 DIAGNOSIS — Z96652 Presence of left artificial knee joint: Secondary | ICD-10-CM | POA: Diagnosis not present

## 2021-07-27 DIAGNOSIS — Z7984 Long term (current) use of oral hypoglycemic drugs: Secondary | ICD-10-CM | POA: Diagnosis not present

## 2021-07-27 DIAGNOSIS — I10 Essential (primary) hypertension: Secondary | ICD-10-CM | POA: Diagnosis not present

## 2021-07-27 DIAGNOSIS — Z96641 Presence of right artificial hip joint: Secondary | ICD-10-CM

## 2021-07-27 DIAGNOSIS — Z7982 Long term (current) use of aspirin: Secondary | ICD-10-CM | POA: Insufficient documentation

## 2021-07-27 DIAGNOSIS — Z96611 Presence of right artificial shoulder joint: Secondary | ICD-10-CM | POA: Insufficient documentation

## 2021-07-27 HISTORY — PX: TOTAL HIP ARTHROPLASTY: SHX124

## 2021-07-27 LAB — TYPE AND SCREEN
ABO/RH(D): A NEG
Antibody Screen: NEGATIVE

## 2021-07-27 LAB — GLUCOSE, CAPILLARY
Glucose-Capillary: 136 mg/dL — ABNORMAL HIGH (ref 70–99)
Glucose-Capillary: 124 mg/dL — ABNORMAL HIGH (ref 70–99)
Glucose-Capillary: 232 mg/dL — ABNORMAL HIGH (ref 70–99)
Glucose-Capillary: 281 mg/dL — ABNORMAL HIGH (ref 70–99)

## 2021-07-27 SURGERY — ARTHROPLASTY, HIP, TOTAL, ANTERIOR APPROACH
Anesthesia: Monitor Anesthesia Care | Site: Hip | Laterality: Right

## 2021-07-27 MED ORDER — SODIUM CHLORIDE 0.9 % IV SOLN
INTRAVENOUS | Status: DC
Start: 2021-07-27 — End: 2021-07-28

## 2021-07-27 MED ORDER — ONDANSETRON HCL 4 MG/2ML IJ SOLN
INTRAMUSCULAR | Status: AC
  Filled 2021-07-27: qty 2

## 2021-07-27 MED ORDER — PHENYLEPHRINE HCL-NACL 20-0.9 MG/250ML-% IV SOLN
INTRAVENOUS | Status: DC | PRN
  Administered 2021-07-27: 20 ug/min via INTRAVENOUS

## 2021-07-27 MED ORDER — METHOCARBAMOL 500 MG PO TABS
500.0000 mg | ORAL_TABLET | Freq: Four times a day (QID) | ORAL | Status: DC | PRN
  Administered 2021-07-27 – 2021-07-28 (×4): 500 mg via ORAL
  Filled 2021-07-27 (×4): qty 1

## 2021-07-27 MED ORDER — POVIDONE-IODINE 10 % EX SWAB
2.0000 | Freq: Once | CUTANEOUS | Status: AC
  Administered 2021-07-27: 2 via TOPICAL

## 2021-07-27 MED ORDER — CHLORHEXIDINE GLUCONATE 0.12 % MT SOLN
15.0000 mL | Freq: Once | OROMUCOSAL | Status: AC
  Administered 2021-07-27: 15 mL via OROMUCOSAL

## 2021-07-27 MED ORDER — METOPROLOL TARTRATE 50 MG PO TABS
50.0000 mg | ORAL_TABLET | Freq: Two times a day (BID) | ORAL | Status: DC
  Administered 2021-07-27 – 2021-07-28 (×2): 50 mg via ORAL
  Filled 2021-07-27 (×2): qty 1

## 2021-07-27 MED ORDER — MORPHINE SULFATE (PF) 2 MG/ML IV SOLN
0.5000 mg | INTRAVENOUS | Status: DC | PRN
  Administered 2021-07-27 (×2): 1 mg via INTRAVENOUS
  Filled 2021-07-27 (×2): qty 1

## 2021-07-27 MED ORDER — VANCOMYCIN HCL 1500 MG/300ML IV SOLN
1500.0000 mg | INTRAVENOUS | Status: DC
  Filled 2021-07-27: qty 300

## 2021-07-27 MED ORDER — GLIPIZIDE ER 5 MG PO TB24
10.0000 mg | ORAL_TABLET | Freq: Every day | ORAL | Status: DC
  Administered 2021-07-28: 10 mg via ORAL
  Filled 2021-07-27: qty 2

## 2021-07-27 MED ORDER — LACTATED RINGERS IV SOLN: INTRAVENOUS | Status: DC

## 2021-07-27 MED ORDER — ONDANSETRON HCL 4 MG/2ML IJ SOLN
INTRAMUSCULAR | Status: DC | PRN
  Administered 2021-07-27: 4 mg via INTRAVENOUS

## 2021-07-27 MED ORDER — ORAL CARE MOUTH RINSE: 15.0000 mL | Freq: Once | OROMUCOSAL | Status: AC

## 2021-07-27 MED ORDER — POLYETHYLENE GLYCOL 3350 17 G PO PACK: 17.0000 g | PACK | Freq: Every day | ORAL | Status: DC | PRN

## 2021-07-27 MED ORDER — METOCLOPRAMIDE HCL 5 MG/ML IJ SOLN: 5.0000 mg | Freq: Three times a day (TID) | INTRAMUSCULAR | Status: DC | PRN

## 2021-07-27 MED ORDER — HYDROCODONE-ACETAMINOPHEN 5-325 MG PO TABS
1.0000 | ORAL_TABLET | ORAL | Status: DC | PRN
  Administered 2021-07-28: 2 via ORAL
  Filled 2021-07-27: qty 2

## 2021-07-27 MED ORDER — ASPIRIN 81 MG PO CHEW
81.0000 mg | CHEWABLE_TABLET | Freq: Two times a day (BID) | ORAL | Status: DC
  Administered 2021-07-27 – 2021-07-28 (×2): 81 mg via ORAL
  Filled 2021-07-27 (×2): qty 1

## 2021-07-27 MED ORDER — INSULIN ASPART 100 UNIT/ML IJ SOLN
0.0000 [IU] | Freq: Three times a day (TID) | INTRAMUSCULAR | Status: DC
  Administered 2021-07-27: 5 [IU] via SUBCUTANEOUS
  Administered 2021-07-28: 2 [IU] via SUBCUTANEOUS
  Administered 2021-07-28: 5 [IU] via SUBCUTANEOUS

## 2021-07-27 MED ORDER — PROPOFOL 10 MG/ML IV BOLUS
INTRAVENOUS | Status: DC | PRN
  Administered 2021-07-27: 30 mg via INTRAVENOUS

## 2021-07-27 MED ORDER — OXYCODONE HCL 5 MG/5ML PO SOLN: 5.0000 mg | Freq: Once | ORAL | Status: DC | PRN

## 2021-07-27 MED ORDER — DEXAMETHASONE SODIUM PHOSPHATE 10 MG/ML IJ SOLN
INTRAMUSCULAR | Status: AC
  Filled 2021-07-27: qty 1

## 2021-07-27 MED ORDER — DIPHENHYDRAMINE HCL 12.5 MG/5ML PO ELIX: 12.5000 mg | ORAL_SOLUTION | ORAL | Status: DC | PRN

## 2021-07-27 MED ORDER — ONDANSETRON HCL 4 MG PO TABS: 4.0000 mg | ORAL_TABLET | Freq: Four times a day (QID) | ORAL | Status: DC | PRN

## 2021-07-27 MED ORDER — METHOCARBAMOL 500 MG IVPB - SIMPLE MED
500.0000 mg | Freq: Four times a day (QID) | INTRAVENOUS | Status: DC | PRN
  Filled 2021-07-27: qty 55

## 2021-07-27 MED ORDER — TRANEXAMIC ACID-NACL 1000-0.7 MG/100ML-% IV SOLN
1000.0000 mg | Freq: Once | INTRAVENOUS | Status: AC
  Administered 2021-07-27: 1000 mg via INTRAVENOUS
  Filled 2021-07-27: qty 100

## 2021-07-27 MED ORDER — DOCUSATE SODIUM 100 MG PO CAPS
100.0000 mg | ORAL_CAPSULE | Freq: Two times a day (BID) | ORAL | Status: DC
  Administered 2021-07-27 – 2021-07-28 (×2): 100 mg via ORAL
  Filled 2021-07-27 (×2): qty 1

## 2021-07-27 MED ORDER — PROPOFOL 500 MG/50ML IV EMUL
INTRAVENOUS | Status: DC | PRN
  Administered 2021-07-27: 100 ug/kg/min via INTRAVENOUS

## 2021-07-27 MED ORDER — AMLODIPINE BESYLATE 10 MG PO TABS
10.0000 mg | ORAL_TABLET | Freq: Every day | ORAL | Status: DC
  Administered 2021-07-28: 10 mg via ORAL
  Filled 2021-07-27: qty 1

## 2021-07-27 MED ORDER — METFORMIN HCL ER 500 MG PO TB24
1000.0000 mg | ORAL_TABLET | Freq: Two times a day (BID) | ORAL | Status: DC
  Administered 2021-07-27 – 2021-07-28 (×2): 1000 mg via ORAL
  Filled 2021-07-27 (×2): qty 2

## 2021-07-27 MED ORDER — CEFAZOLIN SODIUM-DEXTROSE 2-4 GM/100ML-% IV SOLN
INTRAVENOUS | Status: AC
  Filled 2021-07-27: qty 100

## 2021-07-27 MED ORDER — BISACODYL 10 MG RE SUPP: 10.0000 mg | Freq: Every day | RECTAL | Status: DC | PRN

## 2021-07-27 MED ORDER — LOSARTAN POTASSIUM 25 MG PO TABS
25.0000 mg | ORAL_TABLET | Freq: Every day | ORAL | Status: DC
  Administered 2021-07-28: 25 mg via ORAL
  Filled 2021-07-27: qty 1

## 2021-07-27 MED ORDER — TRANEXAMIC ACID-NACL 1000-0.7 MG/100ML-% IV SOLN
1000.0000 mg | INTRAVENOUS | Status: AC
  Administered 2021-07-27: 1000 mg via INTRAVENOUS
  Filled 2021-07-27: qty 100

## 2021-07-27 MED ORDER — ATORVASTATIN CALCIUM 40 MG PO TABS
40.0000 mg | ORAL_TABLET | Freq: Every day | ORAL | Status: DC
  Administered 2021-07-27: 40 mg via ORAL
  Filled 2021-07-27: qty 1

## 2021-07-27 MED ORDER — CEFAZOLIN SODIUM-DEXTROSE 2-3 GM-%(50ML) IV SOLR
INTRAVENOUS | Status: DC | PRN
  Administered 2021-07-27: 2 g via INTRAVENOUS

## 2021-07-27 MED ORDER — ACETAMINOPHEN 325 MG PO TABS: 325.0000 mg | ORAL_TABLET | Freq: Four times a day (QID) | ORAL | Status: DC | PRN

## 2021-07-27 MED ORDER — DEXAMETHASONE SODIUM PHOSPHATE 10 MG/ML IJ SOLN
10.0000 mg | Freq: Once | INTRAMUSCULAR | Status: AC
  Administered 2021-07-28: 10 mg via INTRAVENOUS
  Filled 2021-07-27: qty 1

## 2021-07-27 MED ORDER — AMISULPRIDE (ANTIEMETIC) 5 MG/2ML IV SOLN: 10.0000 mg | Freq: Once | INTRAVENOUS | Status: DC | PRN

## 2021-07-27 MED ORDER — MENTHOL 3 MG MT LOZG
1.0000 | LOZENGE | OROMUCOSAL | Status: DC | PRN
Start: 2021-07-27 — End: 2021-07-28

## 2021-07-27 MED ORDER — FERROUS SULFATE 325 (65 FE) MG PO TABS
325.0000 mg | ORAL_TABLET | Freq: Three times a day (TID) | ORAL | Status: DC
  Administered 2021-07-27 – 2021-07-28 (×2): 325 mg via ORAL
  Filled 2021-07-27 (×2): qty 1

## 2021-07-27 MED ORDER — PHENOL 1.4 % MT LIQD: 1.0000 | OROMUCOSAL | Status: DC | PRN

## 2021-07-27 MED ORDER — PROPOFOL 1000 MG/100ML IV EMUL
INTRAVENOUS | Status: AC
  Filled 2021-07-27: qty 100

## 2021-07-27 MED ORDER — CEFAZOLIN SODIUM-DEXTROSE 2-4 GM/100ML-% IV SOLN
2.0000 g | Freq: Four times a day (QID) | INTRAVENOUS | Status: AC
  Administered 2021-07-27 (×2): 2 g via INTRAVENOUS
  Filled 2021-07-27 (×2): qty 100

## 2021-07-27 MED ORDER — SODIUM CHLORIDE 0.9 % IR SOLN
Status: DC | PRN
  Administered 2021-07-27: 1000 mL

## 2021-07-27 MED ORDER — DEXAMETHASONE SODIUM PHOSPHATE 10 MG/ML IJ SOLN
8.0000 mg | Freq: Once | INTRAMUSCULAR | Status: AC
  Administered 2021-07-27: 8 mg via INTRAVENOUS

## 2021-07-27 MED ORDER — ONDANSETRON HCL 4 MG/2ML IJ SOLN
4.0000 mg | Freq: Four times a day (QID) | INTRAMUSCULAR | Status: DC | PRN
  Administered 2021-07-27: 4 mg via INTRAVENOUS
  Filled 2021-07-27: qty 2

## 2021-07-27 MED ORDER — FENTANYL CITRATE PF 50 MCG/ML IJ SOSY: 25.0000 ug | PREFILLED_SYRINGE | INTRAMUSCULAR | Status: DC | PRN

## 2021-07-27 MED ORDER — OXYCODONE HCL 5 MG PO TABS: 5.0000 mg | ORAL_TABLET | Freq: Once | ORAL | Status: DC | PRN

## 2021-07-27 MED ORDER — METOCLOPRAMIDE HCL 5 MG PO TABS: 5.0000 mg | ORAL_TABLET | Freq: Three times a day (TID) | ORAL | Status: DC | PRN

## 2021-07-27 MED ORDER — PROPOFOL 500 MG/50ML IV EMUL
INTRAVENOUS | Status: AC
  Filled 2021-07-27: qty 50

## 2021-07-27 MED ORDER — ACETAMINOPHEN 500 MG PO TABS
1000.0000 mg | ORAL_TABLET | Freq: Once | ORAL | Status: AC
Start: 2021-07-27 — End: 2021-07-27
  Administered 2021-07-27: 1000 mg via ORAL
  Filled 2021-07-27: qty 2

## 2021-07-27 MED ORDER — BUPIVACAINE IN DEXTROSE 0.75-8.25 % IT SOLN
INTRATHECAL | Status: DC | PRN
  Administered 2021-07-27: 2 mL via INTRATHECAL

## 2021-07-27 MED ORDER — ONDANSETRON HCL 4 MG/2ML IJ SOLN: 4.0000 mg | Freq: Once | INTRAMUSCULAR | Status: DC | PRN

## 2021-07-27 MED ORDER — STERILE WATER FOR IRRIGATION IR SOLN
Status: DC | PRN
  Administered 2021-07-27: 2000 mL

## 2021-07-27 MED ORDER — HYDROCODONE-ACETAMINOPHEN 7.5-325 MG PO TABS
1.0000 | ORAL_TABLET | ORAL | Status: DC | PRN
  Administered 2021-07-27 – 2021-07-28 (×5): 2 via ORAL
  Filled 2021-07-27 (×5): qty 2

## 2021-07-27 SURGICAL SUPPLY — 42 items
BAG COUNTER SPONGE SURGICOUNT (BAG) IMPLANT
BAG DECANTER FOR FLEXI CONT (MISCELLANEOUS) IMPLANT
BAG ZIPLOCK 12X15 (MISCELLANEOUS) IMPLANT
BLADE SAG 18X100X1.27 (BLADE) ×2 IMPLANT
COVER PERINEAL POST (MISCELLANEOUS) ×2 IMPLANT
COVER SURGICAL LIGHT HANDLE (MISCELLANEOUS) ×2 IMPLANT
CUP ACETBLR 54 OD PINNACLE (Hips) ×1 IMPLANT
DERMABOND ADVANCED (GAUZE/BANDAGES/DRESSINGS) ×1
DERMABOND ADVANCED .7 DNX12 (GAUZE/BANDAGES/DRESSINGS) ×1 IMPLANT
DRAPE FOOT SWITCH (DRAPES) ×2 IMPLANT
DRAPE STERI IOBAN 125X83 (DRAPES) ×2 IMPLANT
DRAPE U-SHAPE 47X51 STRL (DRAPES) ×4 IMPLANT
DRESSING AQUACEL AG SP 3.5X10 (GAUZE/BANDAGES/DRESSINGS) ×1 IMPLANT
DRSG AQUACEL AG SP 3.5X10 (GAUZE/BANDAGES/DRESSINGS) ×2
DURAPREP 26ML APPLICATOR (WOUND CARE) ×2 IMPLANT
ELECT REM PT RETURN 15FT ADLT (MISCELLANEOUS) ×2 IMPLANT
ELIMINATOR HOLE APEX DEPUY (Hips) ×1 IMPLANT
GLOVE BIO SURGEON STRL SZ 6 (GLOVE) ×2 IMPLANT
GLOVE BIOGEL PI IND STRL 6.5 (GLOVE) ×1 IMPLANT
GLOVE BIOGEL PI IND STRL 7.5 (GLOVE) ×1 IMPLANT
GLOVE BIOGEL PI INDICATOR 6.5 (GLOVE) ×1
GLOVE BIOGEL PI INDICATOR 7.5 (GLOVE) ×1
GLOVE ORTHO TXT STRL SZ7.5 (GLOVE) ×4 IMPLANT
GOWN STRL REUS W/ TWL LRG LVL3 (GOWN DISPOSABLE) ×3 IMPLANT
GOWN STRL REUS W/TWL LRG LVL3 (GOWN DISPOSABLE) ×3
HEAD CERAMIC 36 PLUS5 (Hips) ×1 IMPLANT
HOLDER FOLEY CATH W/STRAP (MISCELLANEOUS) ×2 IMPLANT
KIT TURNOVER KIT A (KITS) IMPLANT
LINER NEUTRAL 54X36MM PLUS 4 (Hips) ×1 IMPLANT
MAT HALF PREVALON HALF STRYKER (MISCELLANEOUS) ×1 IMPLANT
PACK ANTERIOR HIP CUSTOM (KITS) ×2 IMPLANT
SCREW 6.5MMX30MM (Screw) ×1 IMPLANT
STEM FEMORAL SZ6 HIGH ACTIS (Stem) ×1 IMPLANT
SUT MNCRL AB 4-0 PS2 18 (SUTURE) ×2 IMPLANT
SUT STRATAFIX 0 PDS 27 VIOLET (SUTURE) ×2
SUT VIC AB 1 CT1 36 (SUTURE) ×6 IMPLANT
SUT VIC AB 2-0 CT1 27 (SUTURE) ×3
SUT VIC AB 2-0 CT1 TAPERPNT 27 (SUTURE) ×2 IMPLANT
SUTURE STRATFX 0 PDS 27 VIOLET (SUTURE) ×1 IMPLANT
TRAY FOLEY MTR SLVR 16FR STAT (SET/KITS/TRAYS/PACK) ×1 IMPLANT
TUBE SUCTION HIGH CAP CLEAR NV (SUCTIONS) ×2 IMPLANT
WATER STERILE IRR 1000ML POUR (IV SOLUTION) ×2 IMPLANT

## 2021-07-27 NOTE — Anesthesia Postprocedure Evaluation (Signed)
Anesthesia Post Note  Patient: Miguel Cox  Procedure(s) Performed: TOTAL HIP ARTHROPLASTY ANTERIOR APPROACH (Right: Hip)     Patient location during evaluation: PACU Anesthesia Type: MAC Level of consciousness: awake and alert Pain management: pain level controlled Vital Signs Assessment: post-procedure vital signs reviewed and stable Respiratory status: spontaneous breathing and respiratory function stable Cardiovascular status: blood pressure returned to baseline and stable Postop Assessment: spinal receding Anesthetic complications: no   No notable events documented.  Last Vitals:  Vitals:   07/27/21 1330 07/27/21 1355  BP: (!) 156/85 139/78  Pulse: 67 61  Resp: 11   Temp:  36.9 C  SpO2: 98% 95%    Last Pain:  Vitals:   07/27/21 1413  TempSrc:   PainSc: 7                  Aundre Hietala DANIEL

## 2021-07-27 NOTE — Anesthesia Procedure Notes (Signed)
Spinal  Patient location during procedure: OR Start time: 07/27/2021 10:12 AM End time: 07/27/2021 10:15 AM Reason for block: surgical anesthesia Staffing Performed: resident/CRNA  Resident/CRNA: Claudia Desanctis, CRNA Performed by: Claudia Desanctis, CRNA Authorized by: Merlinda Frederick, MD   Preanesthetic Checklist Completed: patient identified, IV checked, site marked, risks and benefits discussed, surgical consent, monitors and equipment checked, pre-op evaluation and timeout performed Spinal Block Patient position: sitting Prep: DuraPrep Patient monitoring: heart rate, cardiac monitor, continuous pulse ox and blood pressure Approach: midline Location: L3-4 Injection technique: single-shot Needle Needle type: Pencan  Needle gauge: 24 G Needle length: 10 cm Needle insertion depth: 9 cm Assessment Sensory level: T4 Events: CSF return Additional Notes IV functioning, monitors applied to pt. Expiration date of kit checked and confirmed to be in date. Sterile prep and drape, hand hygiene and sterile gloved used. Pt was positioned and spine was prepped in sterile fashion. Skin was anesthetized with lidocaine. Free flow of clear CSF obtained prior to injecting local anesthetic into CSF x 1 attempt. Spinal needle aspirated freely following injection. Needle was carefully withdrawn, and pt tolerated procedure well. Loss of motor and sensory on exam post injection.

## 2021-07-27 NOTE — Plan of Care (Signed)
  Problem: Skin Integrity: Goal: Risk for impaired skin integrity will decrease Outcome: Progressing   Problem: Nutritional: Goal: Maintenance of adequate nutrition will improve Outcome: Progressing   Problem: Education: Goal: Ability to describe self-care measures that may prevent or decrease complications (Diabetes Survival Skills Education) will improve Outcome: Progressing

## 2021-07-27 NOTE — Interval H&P Note (Signed)
History and Physical Interval Note:  07/27/2021 8:42 AM  Miguel Cox  has presented today for surgery, with the diagnosis of Right hip osteoarthritis.  The various methods of treatment have been discussed with the patient and family. After consideration of risks, benefits and other options for treatment, the patient has consented to  Procedure(s): TOTAL HIP ARTHROPLASTY ANTERIOR APPROACH (Right) as a surgical intervention.  The patient's history has been reviewed, patient examined, no change in status, stable for surgery.  I have reviewed the patient's chart and labs.  Questions were answered to the patient's satisfaction.     Shelda Pal

## 2021-07-27 NOTE — Transfer of Care (Signed)
Immediate Anesthesia Transfer of Care Note  Patient: Miguel Cox  Procedure(s) Performed: TOTAL HIP ARTHROPLASTY ANTERIOR APPROACH (Right: Hip)  Patient Location: PACU  Anesthesia Type:Spinal  Level of Consciousness: drowsy  Airway & Oxygen Therapy: Patient Spontanous Breathing and Patient connected to face mask  Post-op Assessment: Report given to RN and Post -op Vital signs reviewed and stable  Post vital signs: Reviewed and stable  Last Vitals:  Vitals Value Taken Time  BP 157/79 07/27/21 1238  Temp 36.4 C 07/27/21 1239  Pulse 62 07/27/21 1240  Resp 15 07/27/21 1240  SpO2 95 % 07/27/21 1240  Vitals shown include unvalidated device data.  Last Pain:  Vitals:   07/27/21 0751  PainSc: 7       Patients Stated Pain Goal: 5 (07/27/21 0751)  Complications: No notable events documented.

## 2021-07-27 NOTE — Discharge Instructions (Signed)

## 2021-07-27 NOTE — Op Note (Signed)
NAME:  Miguel Cox                ACCOUNT NO.: [id number]      MEDICAL RECORD NO.: [id number]      FACILITY:  [facility]      PHYSICIAN:  Shelda Pal  DATE OF BIRTH:  March 10, 1950     DATE OF PROCEDURE:  07/27/2021                                 OPERATIVE REPORT         PREOPERATIVE DIAGNOSIS: Right  hip osteoarthritis.      POSTOPERATIVE DIAGNOSIS:  Right hip osteoarthritis.      PROCEDURE:  Right total hip replacement through an anterior approach   utilizing DePuy THR system, component size 54 mm pinnacle cup, a size 36+4 neutral   Altrex liner, a size 6 Hi Actis stem with a 36+5 delta ceramic   ball.      SURGEON:  Madlyn Frankel. Charlann Boxer, M.D.      ASSISTANT:  Rosalene Billings, PA-C     ANESTHESIA:  Spinal.      SPECIMENS:  None.      COMPLICATIONS:  None.      BLOOD LOSS:  400 cc     DRAINS:  None.      INDICATION OF THE PROCEDURE:  Miguel Cox is a 71 y.o. male who had   presented to office for evaluation of right hip pain.  Radiographs revealed   progressive degenerative changes with bone-on-bone   articulation of the  hip joint, including subchondral cystic changes and osteophytes.  The patient had painful limited range of   motion significantly affecting their overall quality of life and function.  The patient was failing to    respond to conservative measures including medications and/or injections and activity modification and at this point was ready   to proceed with more definitive measures.  Consent was obtained for   benefit of pain relief.  Specific risks of infection, DVT, component   failure, dislocation, neurovascular injury, and need for revision surgery were reviewed in the office.     PROCEDURE IN DETAIL:  The patient was brought to operative theater.   Once adequate anesthesia, preoperative antibiotics, 2 gm of Ancef, 1 gm of Tranexamic Acid, and 10 mg of Decadron were administered, the patient was positioned supine on the [employer] table.  Once the patient was safely positioned with adequate padding of boney prominences we predraped out the hip, and used fluoroscopy to confirm orientation of the pelvis.      The right hip was then prepped and draped from proximal iliac crest to   mid thigh with a shower curtain technique.      Time-out was performed identifying the patient, planned procedure, and the appropriate extremity.     An incision was then made 2 cm lateral to the   anterior superior iliac spine extending over the orientation of the   tensor fascia lata muscle and sharp dissection was carried down to the   fascia of the muscle.      The fascia was then incised.  The muscle belly was identified and swept   laterally and retractor placed along the superior neck.  Following   cauterization of the circumflex vessels and removing some pericapsular   fat, a second cobra retractor was placed on the inferior  neck.  A T-capsulotomy was made along the line of the   superior neck to the trochanteric fossa, then extended proximally and   distally.  Tag sutures were placed and the retractors were then placed   intracapsular.  We then identified the trochanteric fossa and   orientation of my neck cut and then made a neck osteotomy with the femur on traction.  The femoral   head was removed without difficulty or complication.  Traction was let   off and retractors were placed posterior and anterior around the   acetabulum.      The labrum and foveal tissue were debrided.  I began reaming with a 47 mm   reamer and reamed up to 53 mm reamer with good bony bed preparation and a 54 mm  cup was chosen.  The final 54 mm Pinnacle cup was then impacted under fluoroscopy to confirm the depth of penetration and orientation with respect to   Abduction and forward flexion.  A screw was placed into the ilium followed by the hole eliminator.  The final   36+4 neutral Altrex liner was impacted with good visualized rim fit.  The cup  was positioned anatomically within the acetabular portion of the pelvis.      At this point, the femur was rolled to 100 degrees.  Further capsule was   released off the inferior aspect of the femoral neck.  I then   released the superior capsule proximally.  With the leg in a neutral position the hook was placed laterally   along the femur under the vastus lateralis origin and elevated manually and then held in position using the hook attachment on the bed.  The leg was then extended and adducted with the leg rolled to 100   degrees of external rotation.  Retractors were placed along the medial calcar and posteriorly over the greater trochanter.  Once the proximal femur was fully   exposed, I used a box osteotome to set orientation.  I then began   broaching with the starting chili pepper broach and passed this by hand and then broached up to 6.  With the 6 broach in place I chose a high offset neck and did several trial reductions.  The offset was appropriate, leg lengths   appeared to be equal best matched with the +5 head ball trial confirmed radiographically.   Given these findings, I went ahead and dislocated the hip, repositioned all   retractors and positioned the right hip in the extended and abducted position.  The final 6 Hi Actis stem was   chosen and it was impacted down to the level of neck cut.  Based on this   and the trial reductions, a final 36+5 delta ceramic ball was chosen and   impacted onto a clean and dry trunnion, and the hip was reduced.  The   hip had been irrigated throughout the case again at this point.  I did   reapproximate the superior capsular leaflet to the anterior leaflet   using #1 Vicryl.  The fascia of the   tensor fascia lata muscle was then reapproximated using #1 Vicryl and #0 Stratafix sutures.  The   remaining wound was closed with 2-0 Vicryl and running 4-0 Monocryl.   The hip was cleaned, dried, and dressed sterilely using Dermabond and   Aquacel  dressing.  The patient was then brought   to recovery room in stable condition tolerating the procedure well.    Morrie Sheldon  Domenic Schwab, PA-C was present for the entirety of the case involved from   preoperative positioning, perioperative retractor management, general   facilitation of the case, as well as primary wound closure as assistant.            Madlyn Frankel Charlann Boxer, M.D.        07/27/2021 10:28 AM

## 2021-07-27 NOTE — Progress Notes (Signed)
Pt has arrived to 1331 from PACU s/p Right THA, anterior approach.  Report accepted from Lauren,RN.  Pt is alert and oriented.  Daughter at bedside.  Room orientation completed with call bell placed at bedside.  Will continue to monitor pt.

## 2021-07-27 NOTE — Anesthesia Preprocedure Evaluation (Addendum)
Anesthesia Evaluation  Patient identified by MRN, date of birth, ID band Patient awake    Reviewed: Allergy & Precautions, NPO status , Patient's Chart, lab work & pertinent test results  Airway Mallampati: III  TM Distance: >3 FB Neck ROM: Full    Dental  (+)    Pulmonary sleep apnea ,    Pulmonary exam normal breath sounds clear to auscultation       Cardiovascular hypertension, Pt. on medications and Pt. on home beta blockers + dysrhythmias  Rhythm:Irregular Rate:Normal     Neuro/Psych negative neurological ROS  negative psych ROS   GI/Hepatic negative GI ROS, Neg liver ROS,   Endo/Other  diabetes, Type 2, Oral Hypoglycemic Agents  Renal/GU negative Renal ROS  negative genitourinary   Musculoskeletal  (+) Arthritis , Osteoarthritis,    Abdominal   Peds negative pediatric ROS (+)  Hematology negative hematology ROS (+)   Anesthesia Other Findings   Reproductive/Obstetrics negative OB ROS                           Anesthesia Physical Anesthesia Plan  ASA: 3  Anesthesia Plan: Spinal and MAC   Post-op Pain Management: Minimal or no pain anticipated   Induction: Intravenous  PONV Risk Score and Plan: 2 and Treatment may vary due to age or medical condition  Airway Management Planned: Natural Airway and Simple Face Mask  Additional Equipment: None  Intra-op Plan:   Post-operative Plan: Extubation in OR  Informed Consent: I have reviewed the patients History and Physical, chart, labs and discussed the procedure including the risks, benefits and alternatives for the proposed anesthesia with the patient or authorized representative who has indicated his/her understanding and acceptance.     Dental advisory given  Plan Discussed with: CRNA, Anesthesiologist and Surgeon  Anesthesia Plan Comments:         Anesthesia Quick Evaluation

## 2021-07-27 NOTE — Evaluation (Signed)
Physical Therapy Evaluation Patient Details Name: Miguel Cox MRN: [id number] DOB: August 14, 1950 Today's Date: 07/27/2021  History of Present Illness  71 yo male S/P R THA, direct anterior approach on 07/27/21. PMH: Polio( no residual deficits), gout, afib, DM, LTKA  Clinical Impression  The patient reporting right hip pain 10/10. Has had IV medication and requested PO. Patient agreeable to sitting up in hopes pain will decrease. Patient reported burning and no decrease in pain with mobility.  Patient  mod assist to move to sitting, Stood and took 4 sidesteps along bed. Complained of nausea and dizziness so assisted back into bed. BP 135/83.   Patient plans DC home with daughter assisting. Pt admitted with above diagnosis.  Pt currently with functional limitations due to the deficits listed below (see PT Problem List). Pt will benefit from skilled PT to increase their independence and safety with mobility to allow discharge to the venue listed below.        Recommendations for follow up therapy are one component of a multi-disciplinary discharge planning process, led by the attending physician.  Recommendations may be updated based on patient status, additional functional criteria and insurance authorization.  Follow Up Recommendations Follow physician's recommendations for discharge plan and follow up therapies      Assistance Recommended at Discharge Intermittent Supervision/Assistance  Patient can return home with the following  A little help with walking and/or transfers;Assistance with cooking/housework;Assist for transportation;A little help with bathing/dressing/bathroom;Help with stairs or ramp for entrance    Equipment Recommendations    Recommendations for Other Services       Functional Status Assessment Patient has had a recent decline in their functional status and demonstrates the ability to make significant improvements in function in a reasonable and predictable amount of  time.     Precautions / Restrictions Precautions Precautions: Fall Restrictions Weight Bearing Restrictions: Yes RLE Weight Bearing: Weight bearing as tolerated      Mobility  Bed Mobility Overal bed mobility: Needs Assistance Bed Mobility: Supine to Sit, Sit to Supine     Supine to sit: Mod assist, +2 for safety/equipment, +2 for physical assistance Sit to supine: Max assist, +2 for safety/equipment, +2 for physical assistance   General bed mobility comments: assist with right leg and trunk to sit up. Felt fainty, mod assist for legs back onto bed    Transfers Overall transfer level: Needs assistance Equipment used: Rolling walker (2 wheels) Transfers: Sit to/from Stand Sit to Stand: Min assist, +2 safety/equipment, From elevated surface           General transfer comment: cues for hand and right leg placement.  Side steps along bed,limited by dizziness.    Ambulation/Gait                  Stairs            Wheelchair Mobility    Modified Rankin (Stroke Patients Only)       Balance Overall balance assessment: Needs assistance Sitting-balance support: Bilateral upper extremity supported, Feet supported Sitting balance-Leahy Scale: Fair     Standing balance support: During functional activity, Bilateral upper extremity supported, Reliant on assistive device for balance Standing balance-Leahy Scale: Poor                               Pertinent Vitals/Pain Pain Assessment Pain Assessment: 0-10 Pain Score: 10-Worst pain ever Pain Location: right hip and ant. thigh Pain Descriptors /  Indicators: Aching, Burning, Guarding, Discomfort, Grimacing, Penetrating Pain Intervention(s): Repositioned, Monitored during session, Limited activity within patient's tolerance, Patient requesting pain meds-RN notified    Home Living Family/patient expects to be discharged to:: Private residence Living Arrangements: Alone Available Help at  Discharge: Available 24 hours/day;Family Type of Home: House Home Access: Stairs to enter   [employer] of Steps: 1   Home Layout: One level Home Equipment: Agricultural consultant (2 wheels);Cane - single point;Toilet riser;Shower seat      Prior Function Prior Level of Function : Independent/Modified Independent                     Hand Dominance   Dominant Hand: Right    Extremity/Trunk Assessment   Upper Extremity Assessment Upper Extremity Assessment: Overall WFL for tasks assessed    Lower Extremity Assessment Lower Extremity Assessment: RLE deficits/detail RLE Deficits / Details: able WB in standing, assisted with knee and hip flexion n supine    Cervical / Trunk Assessment Cervical / Trunk Assessment: Normal  Communication   Communication: No difficulties  Cognition Arousal/Alertness: Awake/alert Behavior During Therapy: WFL for tasks assessed/performed Overall Cognitive Status: Within Functional Limits for tasks assessed                                          General Comments      Exercises Total Joint Exercises Ankle Circles/Pumps: AROM, Both, 5 reps Heel Slides: AAROM, Right, 5 reps   Assessment/Plan    PT Assessment Patient needs continued PT services  PT Problem List Decreased strength;Decreased mobility;Decreased safety awareness;Decreased range of motion;Decreased knowledge of precautions;Decreased activity tolerance;Decreased balance;Decreased knowledge of use of DME;Pain       PT Treatment Interventions DME instruction;Therapeutic activities;Gait training;Therapeutic exercise;Patient/family education;Stair training;Functional mobility training    PT Goals (Current goals can be found in the Care Plan section)  Acute Rehab PT Goals Patient Stated Goal: walk without pain PT Goal Formulation: With patient/family Time For Goal Achievement: 08/03/21 Potential to Achieve Goals: Good    Frequency 7X/week      Co-evaluation               AM-PAC PT "6 Clicks" Mobility  Outcome Measure Help needed turning from your back to your side while in a flat bed without using bedrails?: A Lot Help needed moving from lying on your back to sitting on the side of a flat bed without using bedrails?: A Lot Help needed moving to and from a bed to a chair (including a wheelchair)?: A Lot Help needed standing up from a chair using your arms (e.g., wheelchair or bedside chair)?: A Lot Help needed to walk in hospital room?: Total Help needed climbing 3-5 steps with a railing? : Total 6 Click Score: 10    End of Session Equipment Utilized During Treatment: Gait belt Activity Tolerance: Patient limited by pain;Treatment limited secondary to medical complications (Comment) Patient left: in bed;with call bell/phone within reach;with family/visitor present Nurse Communication: Mobility status PT Visit Diagnosis: Unsteadiness on feet (R26.81);Pain;Difficulty in walking, not elsewhere classified (R26.2) Pain - Right/Left: Right Pain - part of body: Hip    Time: 0160-1093 PT Time Calculation (min) (ACUTE ONLY): 18 min   Charges:   PT Evaluation $PT Eval Low Complexity: 1 Low          Blanchard Kelch PT Acute Rehabilitation Services Office [phone] Weekend [phone]  Rada Hay 07/27/2021, 3:53 PM

## 2021-07-27 NOTE — Plan of Care (Signed)
  Problem: Education: Goal: Ability to describe self-care measures that may prevent or decrease complications (Diabetes Survival Skills Education) will improve Outcome: Progressing Goal: Individualized Educational Video(s) Outcome: Progressing   Problem: Coping: Goal: Ability to adjust to condition or change in health will improve Outcome: Progressing   Problem: Fluid Volume: Goal: Ability to maintain a balanced intake and output will improve Outcome: Progressing   Problem: Health Behavior/Discharge Planning: Goal: Ability to identify and utilize available resources and services will improve Outcome: Progressing Goal: Ability to manage health-related needs will improve Outcome: Progressing   Problem: Metabolic: Goal: Ability to maintain appropriate glucose levels will improve Outcome: Progressing   Problem: Nutritional: Goal: Maintenance of adequate nutrition will improve Outcome: Progressing Goal: Progress toward achieving an optimal weight will improve Outcome: Progressing   Problem: Skin Integrity: Goal: Risk for impaired skin integrity will decrease Outcome: Progressing   Problem: Tissue Perfusion: Goal: Adequacy of tissue perfusion will improve Outcome: Progressing   Problem: Education: Goal: Knowledge of General Education information will improve Description: Including pain rating scale, medication(s)/side effects and non-pharmacologic comfort measures Outcome: Progressing   Problem: Health Behavior/Discharge Planning: Goal: Ability to manage health-related needs will improve Outcome: Progressing   Problem: Clinical Measurements: Goal: Ability to maintain clinical measurements within normal limits will improve Outcome: Progressing Goal: Will remain free from infection Outcome: Progressing Goal: Diagnostic test results will improve Outcome: Progressing Goal: Respiratory complications will improve Outcome: Progressing Goal: Cardiovascular complication will  be avoided Outcome: Progressing   Problem: Activity: Goal: Risk for activity intolerance will decrease Outcome: Progressing   Problem: Nutrition: Goal: Adequate nutrition will be maintained Outcome: Progressing   Problem: Coping: Goal: Level of anxiety will decrease Outcome: Progressing   Problem: Elimination: Goal: Will not experience complications related to bowel motility Outcome: Progressing Goal: Will not experience complications related to urinary retention Outcome: Progressing   Problem: Pain Managment: Goal: General experience of comfort will improve Outcome: Progressing   Problem: Safety: Goal: Ability to remain free from injury will improve Outcome: Progressing   Problem: Skin Integrity: Goal: Risk for impaired skin integrity will decrease Outcome: Progressing   Problem: Education: Goal: Knowledge of the prescribed therapeutic regimen will improve Outcome: Progressing Goal: Understanding of discharge needs will improve Outcome: Progressing Goal: Individualized Educational Video(s) Outcome: Progressing   Problem: Activity: Goal: Ability to avoid complications of mobility impairment will improve Outcome: Progressing Goal: Ability to tolerate increased activity will improve Outcome: Progressing   Problem: Clinical Measurements: Goal: Postoperative complications will be avoided or minimized Outcome: Progressing   Problem: Pain Management: Goal: Pain level will decrease with appropriate interventions Outcome: Progressing   Problem: Skin Integrity: Goal: Will show signs of wound healing Outcome: Progressing   

## 2021-07-27 NOTE — Anesthesia Procedure Notes (Signed)
Procedure Name: MAC Date/Time: 07/27/2021 10:10 AM  Performed by: Claudia Desanctis, CRNAPre-anesthesia Checklist: Patient identified, Emergency Drugs available, Suction available and Patient being monitored Patient Re-evaluated:Patient Re-evaluated prior to induction Oxygen Delivery Method: Simple face mask

## 2021-07-27 NOTE — H&P (Signed)
TOTAL HIP ADMISSION H&P  Patient is admitted for right total hip arthroplasty.  Subjective:  Chief Complaint: right hip pain  HPI: Miguel Cox, 71 y.o. male, has a history of pain and functional disability in the right hip(s) due to arthritis and patient has failed non-surgical conservative treatments for greater than 12 weeks to include NSAID's and/or analgesics, corticosteriod injections, and activity modification.  Onset of symptoms was gradual starting 2 years ago with gradually worsening course since that time.The patient noted no past surgery on the right hip(s).  Patient currently rates pain in the right hip at 8 out of 10 with activity. Patient has worsening of pain with activity and weight bearing, pain that interfers with activities of daily living, and pain with passive range of motion. Patient has evidence of joint space narrowing by imaging studies. This condition presents safety issues increasing the risk of falls.  There is no current active infection.  There are no problems to display for this patient.  Past Medical History:  Diagnosis Date   Atrial fibrillation (HCC)    Diagnosed 2020   Diverticula of intestine    DJD (degenerative joint disease)    Dysrhythmia    Essential hypertension    Gout    OSA (obstructive sleep apnea)    Intolerant of CPAP   Osteoarthritis    Polio    Type 2 diabetes mellitus (HCC)     Past Surgical History:  Procedure Laterality Date   removal of toe nail     age 80 for fungus   SHOULDER ARTHROSCOPY Right 2011   right   TOTAL KNEE ARTHROPLASTY Left 01/29/2009   Dr. Shelle Iron    No current facility-administered medications for this encounter.   Current Outpatient Medications  Medication Sig Dispense Refill Last Dose   amLODipine (NORVASC) 10 MG tablet Take 10 mg by mouth daily.  3    aspirin EC 81 MG tablet Take 81 mg by mouth daily.      atorvastatin (LIPITOR) 40 MG tablet Take 40 mg by mouth at bedtime.  3    Cholecalciferol  (VITAMIN D3 PO) Take 1 tablet by mouth daily.      colchicine 0.6 MG tablet Take 0.6 mg by mouth daily as needed (gout).       Cyanocobalamin (B-12 PO) Take 1 tablet by mouth daily.      EPINEPHrine (EPIPEN 2-PAK) 0.3 mg/0.3 mL IJ SOAJ injection Inject 0.3 mLs (0.3 mg total) into the muscle once. 1 Device 1    glipiZIDE (GLUCOTROL XL) 10 MG 24 hr tablet Take 10 mg by mouth daily.      ibuprofen (ADVIL) 800 MG tablet Take 800 mg by mouth every 8 (eight) hours as needed for moderate pain.      Krill Oil 1000 MG CAPS Take 1,000 mg by mouth daily.       losartan (COZAAR) 25 MG tablet Take 25 mg by mouth daily.  3    metFORMIN (GLUCOPHAGE-XR) 500 MG 24 hr tablet Take 1,000 mg by mouth 2 (two) times daily.      metoprolol tartrate (LOPRESSOR) 50 MG tablet Take 1 tablet (50 mg total) by mouth 2 (two) times daily. 180 tablet 1    Multiple Vitamin (MULTIVITAMIN WITH MINERALS) TABS tablet Take 1 tablet by mouth daily.      Allergies  Allergen Reactions   Other Swelling    Pt can't eat anything from [facility], it causes swelling   Penicillins Hives and Itching  Did it involve swelling of the face/tongue/throat, SOB, or low BP? No Did it involve sudden or severe rash/hives, skin peeling, or any reaction on the inside of your mouth or nose? No Did you need to seek medical attention at a hospital or doctor's office? No When did it last happen?       If all above answers are "NO", may proceed with cephalosporin use.    Cefazolin Rash    Social History   Tobacco Use   Smoking status: Never   Smokeless tobacco: Never  Substance Use Topics   Alcohol use: Not Currently    Family History  Problem Relation Age of Onset   Cancer Mother    Heart attack Brother 88     Review of Systems  Constitutional:  Negative for chills and fever.  Respiratory:  Negative for cough and shortness of breath.   Cardiovascular:  Negative for chest pain.  Gastrointestinal:  Negative for nausea and vomiting.   Musculoskeletal:  Positive for arthralgias.     Objective:  Physical Exam Well nourished and well developed. General: Alert and oriented x3, cooperative and pleasant, no acute distress. Head: normocephalic, atraumatic, neck supple. Eyes: EOMI.  Musculoskeletal: Right hip exam: Painful and limited hip flexion and internal rotation to 5 degrees with pelvic tilting External rotation to 20 degrees Neurovascular intact distally without significant lower extremity edema  Calves soft and nontender. Motor function intact in LE. Strength 5/5 LE bilaterally. Neuro: Distal pulses 2+. Sensation to light touch intact in LE.  Vital signs in last 24 hours:    Labs:   Estimated body mass index is 36.23 kg/m as calculated from the following:   Height as of 07/20/21: 6' (1.829 m).   Weight as of 07/20/21: 121.2 kg.   Imaging Review Plain radiographs demonstrate severe degenerative joint disease of the right hip(s). The bone quality appears to be adequate for age and reported activity level.      Assessment/Plan:  End stage arthritis, right hip(s)  The patient history, physical examination, clinical judgement of the provider and imaging studies are consistent with end stage degenerative joint disease of the right hip(s) and total hip arthroplasty is deemed medically necessary. The treatment options including medical management, injection therapy, arthroscopy and arthroplasty were discussed at length. The risks and benefits of total hip arthroplasty were presented and reviewed. The risks due to aseptic loosening, infection, stiffness, dislocation/subluxation,  thromboembolic complications and other imponderables were discussed.  The patient acknowledged the explanation, agreed to proceed with the plan and consent was signed. Patient is being admitted for inpatient treatment for surgery, pain control, PT, OT, prophylactic antibiotics, VTE prophylaxis, progressive ambulation and ADL's and  discharge planning.The patient is planning to be discharged  home.  Therapy Plans: HEP Disposition: Home with daughter (staying with him) Planned DVT Prophylaxis: aspirin 81mg  BID DME needed: none PCP: Dr. , referred to cardio Cardiologist: Dr. Wyline Mood, clearance received TXA: IV Allergies: PCN - rash/hives Anesthesia Concerns: none BMI: 36.7 Last HgbA1c: 6.5%  Other:  - Hydrocodone, robaxin, celebrex pending kidney function - Permanent a fib - not on anticoagulation - Hx of OSA - not on CPAP  Diona Browner, PA-C Orthopedic Surgery [facility] [place] [phone]

## 2021-07-28 ENCOUNTER — Encounter (HOSPITAL_COMMUNITY): Payer: Self-pay | Admitting: Orthopedic Surgery

## 2021-07-28 DIAGNOSIS — M1611 Unilateral primary osteoarthritis, right hip: Secondary | ICD-10-CM | POA: Diagnosis not present

## 2021-07-28 LAB — BASIC METABOLIC PANEL
Anion gap: 8 (ref 5–15)
BUN: 22 mg/dL (ref 8–23)
CO2: 27 mmol/L (ref 22–32)
Calcium: 8.5 mg/dL — ABNORMAL LOW (ref 8.9–10.3)
Chloride: 104 mmol/L (ref 98–111)
Creatinine, Ser: 0.95 mg/dL (ref 0.61–1.24)
GFR, Estimated: >60 mL/min (ref >60)
Glucose, Bld: 159 mg/dL — ABNORMAL HIGH (ref 70–99)
Potassium: 4.2 mmol/L (ref 3.5–5.1)
Sodium: 139 mmol/L (ref 135–145)

## 2021-07-28 LAB — CBC
HCT: 37.7 % — ABNORMAL LOW (ref 39.0–52.0)
Hemoglobin: 12.6 g/dL — ABNORMAL LOW (ref 13.0–17.0)
MCH: 30.4 pg (ref 26.0–34.0)
MCHC: 33.4 g/dL (ref 30.0–36.0)
MCV: 90.8 fL (ref 80.0–100.0)
Platelets: 222 10*3/uL (ref 150–400)
RBC: 4.15 MIL/uL — ABNORMAL LOW (ref 4.22–5.81)
RDW: 14.3 % (ref 11.5–15.5)
WBC: 14 10*3/uL — ABNORMAL HIGH (ref 4.0–10.5)
nRBC: 0 % (ref 0.0–0.2)

## 2021-07-28 LAB — GLUCOSE, CAPILLARY
Glucose-Capillary: 148 mg/dL — ABNORMAL HIGH (ref 70–99)
Glucose-Capillary: 217 mg/dL — ABNORMAL HIGH (ref 70–99)

## 2021-07-28 MED ORDER — HYDROCODONE-ACETAMINOPHEN 5-325 MG PO TABS: 1.0000 | ORAL_TABLET | ORAL | 0 refills | Status: DC | PRN

## 2021-07-28 MED ORDER — POLYETHYLENE GLYCOL 3350 17 G PO PACK: 17.0000 g | PACK | Freq: Every day | ORAL | 0 refills | Status: DC | PRN

## 2021-07-28 MED ORDER — ASPIRIN 81 MG PO CHEW: 81.0000 mg | CHEWABLE_TABLET | Freq: Two times a day (BID) | ORAL | 0 refills | Status: AC

## 2021-07-28 MED ORDER — DOCUSATE SODIUM 100 MG PO CAPS: 100.0000 mg | ORAL_CAPSULE | Freq: Two times a day (BID) | ORAL | 0 refills | Status: DC

## 2021-07-28 MED ORDER — METHOCARBAMOL 500 MG PO TABS: 500.0000 mg | ORAL_TABLET | Freq: Four times a day (QID) | ORAL | 0 refills | Status: DC | PRN

## 2021-07-28 NOTE — Progress Notes (Signed)
Physical Therapy Treatment Patient Details Name: Miguel Cox MRN: [id number] DOB: 01/26/1951 Today's Date: 07/28/2021   History of Present Illness 71 yo male S/P R THA, direct anterior approach on 07/27/21. PMH: Polio. gout, afib, DM, LTKA    PT Comments    Patient feeling well, complains of anterior thigh burning when stands. Patient ambulated x 150' and performed HEP. Will ambulate again prior to Dc when dtr present.    Recommendations for follow up therapy are one component of a multi-disciplinary discharge planning process, led by the attending physician.  Recommendations may be updated based on patient status, additional functional criteria and insurance authorization.  Follow Up Recommendations  Follow physician's recommendations for discharge plan and follow up therapies     Assistance Recommended at Discharge Set up Supervision/Assistance  Patient can return home with the following A little help with walking and/or transfers;Assistance with cooking/housework;Assist for transportation;A little help with bathing/dressing/bathroom;Help with stairs or ramp for entrance   Equipment Recommendations  None recommended by PT    Recommendations for Other Services       Precautions / Restrictions Precautions Precautions: Fall     Mobility  Bed Mobility   Bed Mobility: Supine to Sit     Supine to sit: Supervision     General bed mobility comments: pt. did use rails, said he was sleeping in recliner    Transfers   Equipment used: Rolling walker (2 wheels) Transfers: Sit to/from Stand Sit to Stand: Min guard           General transfer comment: cues for hand and right leg placement., patient tends to flop, decreased control of descent    Ambulation/Gait Ambulation/Gait assistance: Min guard Gait Distance (Feet): 150 Feet Assistive device: Rolling walker (2 wheels) Gait Pattern/deviations: Step-through pattern Gait velocity: decr     General Gait Details:  cues for safety inside RW and sequence   Stairs             Wheelchair Mobility    Modified Rankin (Stroke Patients Only)       Balance Overall balance assessment: Mild deficits observed, not formally tested                                          Cognition Arousal/Alertness: Awake/alert Behavior During Therapy: Impulsive                                            Exercises Total Joint Exercises Ankle Circles/Pumps: AROM, Both, 10 reps Quad Sets: AROM, Both, 10 reps Short Arc Quad: AROM, Right, 10 reps Heel Slides: AAROM, Right, 10 reps Hip ABduction/ADduction: AROM, Right, AAROM, 10 reps Long Arc Quad: AROM, Right, 10 reps    General Comments        Pertinent Vitals/Pain Pain Assessment Pain Score: 2  Pain Location: right hip and ant. thigh Pain Descriptors / Indicators: Burning Pain Intervention(s): Monitored during session, Premedicated before session    Home Living                          Prior Function            PT Goals (current goals can now be found in the care plan section) Progress towards PT goals: Progressing  toward goals    Frequency    7X/week      PT Plan Current plan remains appropriate    Co-evaluation              AM-PAC PT "6 Clicks" Mobility   Outcome Measure  Help needed turning from your back to your side while in a flat bed without using bedrails?: A Little Help needed moving from lying on your back to sitting on the side of a flat bed without using bedrails?: A Little Help needed moving to and from a bed to a chair (including a wheelchair)?: A Little Help needed standing up from a chair using your arms (e.g., wheelchair or bedside chair)?: A Little Help needed to walk in hospital room?: A Little Help needed climbing 3-5 steps with a railing? : A Little 6 Click Score: 18    End of Session Equipment Utilized During Treatment: Gait belt Activity Tolerance:  Patient tolerated treatment well Patient left: in chair;with call bell/phone within reach;with chair alarm set Nurse Communication: Mobility status PT Visit Diagnosis: Unsteadiness on feet (R26.81);Pain;Difficulty in walking, not elsewhere classified (R26.2) Pain - Right/Left: Right Pain - part of body: Hip     Time: 1610-9604 PT Time Calculation (min) (ACUTE ONLY): 15 min  Charges:  $Gait Training: 8-22 mins                     Miguel Cox PT Acute Rehabilitation Services Office [phone] Weekend pager-[phone]    Rada Hay 07/28/2021, 1:46 PM

## 2021-07-28 NOTE — TOC Transition Note (Signed)
Transition of Care [facility] - CM/SW Discharge Note  Patient Details  Name: Miguel Cox MRN: [id number] Date of Birth: 12/25/1950  Transition of Care [facility]) CM/SW Contact:  Sherie Don, LCSW Phone Number: 07/28/2021, 10:26 AM  Clinical Narrative: Patient is expected to discharge home after working with PT. CSW met with patient to confirm discharge plan. Patient will go home with a home exercise program (HEP). Patient has a rolling walker and toilet riser with handles at home, so there are no DME needs at this time. TOC signing off.  Final next level of care: Home/Self Care Barriers to Discharge: No Barriers Identified  Patient Goals and CMS Choice Patient states their goals for this hospitalization and ongoing recovery are:: Discharge home with HEP Choice offered to / list presented to : NA  Discharge Plan and Services       DME Arranged: N/A DME Agency: NA  Readmission Risk Interventions     No data to display

## 2021-07-28 NOTE — Progress Notes (Signed)
   Subjective: 1 Day Post-Op Procedure(s) (LRB): TOTAL HIP ARTHROPLASTY ANTERIOR APPROACH (Right) Patient reports pain as mild.   Patient seen in rounds for Dr. Charlann Boxer. Patient is resting in bed this morning on exam. No acute events overnight. Foley catheter removed, voiding without difficulty. Ambulated a few feet with PT.  We will start therapy today.   Objective: Vital signs in last 24 hours: Temp:  [97.6 F (36.4 C)-98.4 F (36.9 C)] 97.9 F (36.6 C) (06/30 0521) Pulse Rate:  [58-81] 66 (06/30 0521) Resp:  [11-20] 18 (06/30 0521) BP: (139-169)/(78-96) 158/82 (06/30 0521) SpO2:  [94 %-100 %] 95 % (06/30 0521)  Intake/Output from previous day:  Intake/Output Summary (Last 24 hours) at 07/28/2021 0753 Last data filed at 07/28/2021 0640 Gross per 24 hour  Intake 3864.64 ml  Output 2850 ml  Net 1014.64 ml     Intake/Output this shift: No intake/output data recorded.  Labs: Recent Labs    07/28/21 0325  HGB 12.6*   Recent Labs    07/28/21 0325  WBC 14.0*  RBC 4.15*  HCT 37.7*  PLT 222   Recent Labs    07/28/21 0325  NA 139  K 4.2  CL 104  CO2 27  BUN 22  CREATININE 0.95  GLUCOSE 159*  CALCIUM 8.5*   No results for input(s): "LABPT", "INR" in the last 72 hours.  Exam: General - Patient is Alert and Oriented Extremity - Neurologically intact Sensation intact distally Intact pulses distally Dorsiflexion/Plantar flexion intact Dressing - dressing C/D/I Motor Function - intact, moving foot and toes well on exam.   Past Medical History:  Diagnosis Date   Atrial fibrillation (HCC)    Diagnosed 2020   Diverticula of intestine    DJD (degenerative joint disease)    Dysrhythmia    Essential hypertension    Gout    OSA (obstructive sleep apnea)    Intolerant of CPAP   Osteoarthritis    Polio    Type 2 diabetes mellitus (HCC)     Assessment/Plan: 1 Day Post-Op Procedure(s) (LRB): TOTAL HIP ARTHROPLASTY ANTERIOR APPROACH (Right) Principal  Problem:   S/P total right hip arthroplasty  Estimated body mass index is 36.23 kg/m as calculated from the following:   Height as of this encounter: 6' (1.829 m).   Weight as of this encounter: 121.2 kg. Advance diet Up with therapy D/C IV fluids  DVT Prophylaxis - Aspirin Weight bearing as tolerated.  Hgb stable at 12.6 this AM.   Plan is to go Home after hospital stay. Plan for discharge today after meeting goals with therapy. Follow up in the office in 2 weeks.   Dennie Bible, PA-C Orthopedic Surgery [phone] 07/28/2021, 7:53 AM

## 2021-07-28 NOTE — Progress Notes (Signed)
   07/28/21 1345  PT Visit Information  Last PT Received On 07/28/21  Assistance Needed +1  History of Present Illness 71 yo male S/P R THA, direct anterior approach on 07/27/21. PMH: Polio. gout, afib, DM, LTKA  Precautions  Precautions Fall  Pain Assessment  Pain Score 5  Pain Location right hip and ant. thigh  Pain Descriptors / Indicators Burning  Pain Intervention(s) Monitored during session  Cognition  Arousal/Alertness Awake/alert  Behavior During Therapy Impulsive  Overall Cognitive Status Within Functional Limits for tasks assessed  Bed Mobility  General bed mobility comments in recliner  Transfers  Overall transfer level Needs assistance  Equipment used Rolling walker (2 wheels)  Transfers Sit to/from Stand  Sit to Stand Min guard  General transfer comment cues for hand and right leg placement., patient tends to flop, decreased control of descent  Ambulation/Gait  Ambulation/Gait assistance Min guard  Gait Distance (Feet) 150 Feet  Assistive device Rolling walker (2 wheels)  Gait Pattern/deviations Step-through pattern  General Gait Details cues for safety inside RW and sequence  Gait velocity decr  PT - End of Session  Equipment Utilized During Treatment Gait belt  Activity Tolerance Patient tolerated treatment well  Patient left in chair;with call bell/phone within reach;with chair alarm set  Nurse Communication Mobility status   PT - Assessment/Plan  PT Plan Current plan remains appropriate  PT Visit Diagnosis Unsteadiness on feet (R26.81);Pain;Difficulty in walking, not elsewhere classified (R26.2)  Pain - Right/Left Right  Pain - part of body Hip  PT Frequency (ACUTE ONLY) 7X/week  Follow Up Recommendations Follow physician's recommendations for discharge plan and follow up therapies  Assistance recommended at discharge Set up Supervision/Assistance  Patient can return home with the following A little help with walking and/or transfers;Assistance with  cooking/housework;Assist for transportation;A little help with bathing/dressing/bathroom;Help with stairs or ramp for entrance  PT equipment None recommended by PT  AM-PAC PT "6 Clicks" Mobility Outcome Measure (Version 2)  Help needed turning from your back to your side while in a flat bed without using bedrails? 3  Help needed moving from lying on your back to sitting on the side of a flat bed without using bedrails? 3  Help needed moving to and from a bed to a chair (including a wheelchair)? 3  Help needed standing up from a chair using your arms (e.g., wheelchair or bedside chair)? 3  Help needed to walk in hospital room? 3  Help needed climbing 3-5 steps with a railing?  3  6 Click Score 18  Consider Recommendation of Discharge To: Home with [facility]  Progressive Mobility  What is the highest level of mobility based on the progressive mobility assessment? Level 5 (Walks with assist in room/hall) - Balance while stepping forward/back and can walk in room with assist - Complete  PT Goal Progression  Progress towards PT goals Progressing toward goals  PT Time Calculation  PT Start Time (ACUTE ONLY) 1216  PT Stop Time (ACUTE ONLY) 1230  PT Time Calculation (min) (ACUTE ONLY) 14 min  PT General Charges  $$ ACUTE PT VISIT 1 Visit  PT Treatments  $Gait Training 8-22 mins   Blanchard Kelch PT Acute Rehabilitation Services Office [phone] Weekend [phone]

## 2021-08-02 NOTE — Discharge Summary (Signed)
Patient ID: Miguel Cox MRN: [id number] DOB/AGE: 1950-08-21 71 y.o.  Admit date: 07/27/2021 Discharge date: 07/28/2021  Admission Diagnoses:  Right hip osteoarthritis  Discharge Diagnoses:  Principal Problem:   S/P total right hip arthroplasty   Past Medical History:  Diagnosis Date   Atrial fibrillation (HCC)    Diagnosed 2020   Diverticula of intestine    DJD (degenerative joint disease)    Dysrhythmia    Essential hypertension    Gout    OSA (obstructive sleep apnea)    Intolerant of CPAP   Osteoarthritis    Polio    Type 2 diabetes mellitus (HCC)     Surgeries: Procedure(s): TOTAL HIP ARTHROPLASTY ANTERIOR APPROACH on 07/27/2021   Consultants:   Discharged Condition: Improved  Hospital Course: Miguel Cox is an 71 y.o. male who was admitted 07/27/2021 for operative treatment ofS/P total right hip arthroplasty. Patient has severe unremitting pain that affects sleep, daily activities, and work/hobbies. After pre-op clearance the patient was taken to the operating room on 07/27/2021 and underwent  Procedure(s): TOTAL HIP ARTHROPLASTY ANTERIOR APPROACH.    Patient was given perioperative antibiotics:  Anti-infectives (From admission, onward)    Start     Dose/Rate Route Frequency Ordered Stop   07/27/21 1600  ceFAZolin (ANCEF) IVPB 2g/100 mL premix        2 g 200 mL/hr over 30 Minutes Intravenous Every 6 hours 07/27/21 1358 07/27/21 2223   07/27/21 0738  ceFAZolin (ANCEF) 2-4 GM/100ML-% IVPB       Note to Pharmacy: Vevelyn Royals D: cabinet override      07/27/21 0738 07/27/21 1944   07/27/21 0730  vancomycin (VANCOREADY) IVPB 1500 mg/300 mL  Status:  Discontinued        1,500 mg 150 mL/hr over 120 Minutes Intravenous On call to O.R. 07/27/21 8341 07/27/21 0739        Patient was given sequential compression devices, early ambulation, and chemoprophylaxis to prevent DVT. Patient worked with PT and was meeting their goals regarding safe ambulation and  transfers.  Patient benefited maximally from hospital stay and there were no complications.    Recent vital signs: No data found.   Recent laboratory studies: No results for input(s): "WBC", "HGB", "HCT", "PLT", "NA", "K", "CL", "CO2", "BUN", "CREATININE", "GLUCOSE", "INR", "CALCIUM" in the last 72 hours.  Invalid input(s): "PT", "2"   Discharge Medications:   Allergies as of 07/28/2021       Reactions   Other Swelling   Pt can't eat anything from [facility], it causes swelling   Penicillins Hives, Itching   Did it involve swelling of the face/tongue/throat, SOB, or low BP? No Did it involve sudden or severe rash/hives, skin peeling, or any reaction on the inside of your mouth or nose? No Did you need to seek medical attention at a hospital or doctor's office? No When did it last happen?       If all above answers are "NO", may proceed with cephalosporin use. Tolerated Cephalosporin Date: 07/27/21.   Cefazolin Rash        Medication List     STOP taking these medications    aspirin EC 81 MG tablet Replaced by: aspirin 81 MG chewable tablet   ibuprofen 800 MG tablet Commonly known as: ADVIL       TAKE these medications    amLODipine 10 MG tablet Commonly known as: NORVASC Take 10 mg by mouth daily.   aspirin 81 MG chewable tablet Chew 1 tablet (  81 mg total) by mouth 2 (two) times daily for 28 days. Replaces: aspirin EC 81 MG tablet   atorvastatin 40 MG tablet Commonly known as: LIPITOR Take 40 mg by mouth at bedtime.   B-12 PO Take 1 tablet by mouth daily.   colchicine 0.6 MG tablet Take 0.6 mg by mouth daily as needed (gout).   docusate sodium 100 MG capsule Commonly known as: COLACE Take 1 capsule (100 mg total) by mouth 2 (two) times daily.   EPINEPHrine 0.3 mg/0.3 mL Soaj injection Commonly known as: EpiPen 2-Pak Inject 0.3 mLs (0.3 mg total) into the muscle once.   glipiZIDE 10 MG 24 hr tablet Commonly known as: GLUCOTROL XL Take 10 mg by  mouth daily.   HYDROcodone-acetaminophen 5-325 MG tablet Commonly known as: NORCO/VICODIN Take 1 tablet by mouth every 4 (four) hours as needed for severe pain.   Krill Oil 1000 MG Caps Take 1,000 mg by mouth daily.   losartan 25 MG tablet Commonly known as: COZAAR Take 25 mg by mouth daily.   metFORMIN 500 MG 24 hr tablet Commonly known as: GLUCOPHAGE-XR Take 1,000 mg by mouth 2 (two) times daily.   methocarbamol 500 MG tablet Commonly known as: ROBAXIN Take 1 tablet (500 mg total) by mouth every 6 (six) hours as needed for muscle spasms (muscle pain).   metoprolol tartrate 50 MG tablet Commonly known as: LOPRESSOR Take 1 tablet (50 mg total) by mouth 2 (two) times daily.   multivitamin with minerals Tabs tablet Take 1 tablet by mouth daily.   polyethylene glycol 17 g packet Commonly known as: MIRALAX / GLYCOLAX Take 17 g by mouth daily as needed for mild constipation.   VITAMIN D3 PO Take 1 tablet by mouth daily.               Discharge Care Instructions  (From admission, onward)           Start     Ordered   07/28/21 0000  Change dressing       Comments: Maintain surgical dressing until follow up in the clinic. If the edges start to pull up, may reinforce with tape. If the dressing is no longer working, may remove and cover with gauze and tape, but must keep the area dry and clean.  Call with any questions or concerns.   07/28/21 0807            Diagnostic Studies: DG Pelvis Portable  Result Date: 07/27/2021 CLINICAL DATA:  Postop hip surgery. EXAM: PORTABLE PELVIS 1-2 VIEWS COMPARISON:  None Available. FINDINGS: Status post right hip arthroplasty. Acetabular component with a single fixation screw. No perihardware fracture. Moderate degenerative changes of the left hip joint. Multilevel degenerate disc disease of the lower lumbar spine. IMPRESSION: Status post right hip total arthroplasty. Electronically Signed   By: Larose Hires D.O.   On: 07/27/2021  14:01   DG HIP UNILAT WITH PELVIS 1V RIGHT  Result Date: 07/27/2021 CLINICAL DATA:  Right hip replacement. EXAM: DG HIP (WITH OR WITHOUT PELVIS) 1V RIGHT COMPARISON:  None Available. FLUOROSCOPY TIME:  Radiation Exposure Index (as provided by the fluoroscopic device): 3.18 mGy Kerma C-arm fluoroscopic images were obtained intraoperatively and submitted for post operative interpretation. FINDINGS: Intraoperative fluoroscopic images demonstrate right total hip arthroplasty. The femoral head component has not yet been placed. No acute osseous abnormality. IMPRESSION: 1. Intraoperative fluoroscopic guidance for right total hip arthroplasty. Electronically Signed   By: Obie Dredge M.D.   On: 07/27/2021 12:03  DG C-Arm 1-60 Min-No Report  Result Date: 07/27/2021 Fluoroscopy was utilized by the requesting physician.  No radiographic interpretation.   DG C-Arm 1-60 Min-No Report  Result Date: 07/27/2021 Fluoroscopy was utilized by the requesting physician.  No radiographic interpretation.    Disposition: Discharge disposition: 01-Home or Self Care       Discharge Instructions     Call MD / Call 911   Complete by: As directed    If you experience chest pain or shortness of breath, CALL 911 and be transported to the hospital emergency room.  If you develope a fever above 101 F, pus (white drainage) or increased drainage or redness at the wound, or calf pain, call your surgeon's office.   Change dressing   Complete by: As directed    Maintain surgical dressing until follow up in the clinic. If the edges start to pull up, may reinforce with tape. If the dressing is no longer working, may remove and cover with gauze and tape, but must keep the area dry and clean.  Call with any questions or concerns.   Constipation Prevention   Complete by: As directed    Drink plenty of fluids.  Prune juice may be helpful.  You may use a stool softener, such as Colace (over the counter) 100 mg twice a day.   Use MiraLax (over the counter) for constipation as needed.   Diet - low sodium heart healthy   Complete by: As directed    Increase activity slowly as tolerated   Complete by: As directed    Weight bearing as tolerated with assist device (walker, cane, etc) as directed, use it as long as suggested by your surgeon or therapist, typically at least 4-6 weeks.   Post-operative opioid taper instructions:   Complete by: As directed    POST-OPERATIVE OPIOID TAPER INSTRUCTIONS: It is important to wean off of your opioid medication as soon as possible. If you do not need pain medication after your surgery it is ok to stop day one. Opioids include: Codeine, Hydrocodone(Norco, Vicodin), Oxycodone(Percocet, oxycontin) and hydromorphone amongst others.  Long term and even short term use of opiods can cause: Increased pain response Dependence Constipation Depression Respiratory depression And more.  Withdrawal symptoms can include Flu like symptoms Nausea, vomiting And more Techniques to manage these symptoms Hydrate well Eat regular healthy meals Stay active Use relaxation techniques(deep breathing, meditating, yoga) Do Not substitute Alcohol to help with tapering If you have been on opioids for less than two weeks and do not have pain than it is ok to stop all together.  Plan to wean off of opioids This plan should start within one week post op of your joint replacement. Maintain the same interval or time between taking each dose and first decrease the dose.  Cut the total daily intake of opioids by one tablet each day Next start to increase the time between doses. The last dose that should be eliminated is the evening dose.      TED hose   Complete by: As directed    Use stockings (TED hose) for 2 weeks on both leg(s).  You may remove them at night for sleeping.        Follow-up Information     Durene Romans, MD. Schedule an appointment as soon as possible for a visit in 2  week(s).   Specialty: Orthopedic Surgery Contact information: [address] [place] [address] [phone]  Signed: Cassandria Anger 08/02/2021, 9:28 AM

## 2021-09-01 NOTE — Progress Notes (Signed)
Cardiology Office Note:   Date:  09/06/2021  NAME:  Miguel Cox    MRN: [id number] DOB:  Aug 30, 1950   PCP:  Suzan Slick, MD  Cardiologist:  Reatha Harps, MD  Electrophysiologist:  None   Referring MD: Suzan Slick, MD   Chief Complaint  Patient presents with   Follow-up   History of Present Illness:   Miguel Cox is a 71 y.o. male with a hx of persistent atrial fibrillation, diabetes, hypertension, hyperlipidemia who presents for follow-up.  Presents for routine follow-up.  Had issues on Xarelto.  Reports not feeling well.  He stopped this.  He had been on aspirin since that time point.  We discussed retrying Eliquis given his risk of stroke.  He is okay to do this.  He is without complaints.  He is unaware of his A-fib.  Had a hip replacement surgery recently.  Seems to be doing well.  Denies any cardiac symptoms.  No chest pain or trouble breathing reported.  Problem List 1. Persistent Atrial fibrillation  -dx 10/2018  -CHADSVASC = 3 (Age, HTN, DM) 2. Diabetes -A1c 7.3 -T chol 162, HDL 44, LDL 78 3. HTN 4. OSA  Past Medical History: Past Medical History:  Diagnosis Date   Atrial fibrillation (HCC)    Diagnosed 2020   Diverticula of intestine    DJD (degenerative joint disease)    Dysrhythmia    Essential hypertension    Gout    OSA (obstructive sleep apnea)    Intolerant of CPAP   Osteoarthritis    Polio    Type 2 diabetes mellitus (HCC)     Past Surgical History: Past Surgical History:  Procedure Laterality Date   removal of toe nail     age 77 for fungus   SHOULDER ARTHROSCOPY Right 2011   right   TOTAL HIP ARTHROPLASTY Right 07/27/2021   Procedure: TOTAL HIP ARTHROPLASTY ANTERIOR APPROACH;  Surgeon: Durene Romans, MD;  Location: [facility]S;  Service: Orthopedics;  Laterality: Right;   TOTAL KNEE ARTHROPLASTY Left 01/29/2009   Dr. Shelle Iron    Current Medications: Current Meds  Medication Sig   amLODipine (NORVASC) 10 MG tablet Take 10 mg  by mouth daily.   apixaban (ELIQUIS) 5 MG TABS tablet Take 1 tablet (5 mg total) by mouth 2 (two) times daily.   atorvastatin (LIPITOR) 40 MG tablet Take 40 mg by mouth at bedtime.   Cholecalciferol (VITAMIN D3 PO) Take 1 tablet by mouth daily.   colchicine 0.6 MG tablet Take 0.6 mg by mouth daily as needed (gout).    Cyanocobalamin (B-12 PO) Take 1 tablet by mouth daily.   docusate sodium (COLACE) 100 MG capsule Take 1 capsule (100 mg total) by mouth 2 (two) times daily.   EPINEPHrine (EPIPEN 2-PAK) 0.3 mg/0.3 mL IJ SOAJ injection Inject 0.3 mLs (0.3 mg total) into the muscle once.   glipiZIDE (GLUCOTROL XL) 10 MG 24 hr tablet Take 10 mg by mouth daily.   HYDROcodone-acetaminophen (NORCO/VICODIN) 5-325 MG tablet Take 1 tablet by mouth every 4 (four) hours as needed for severe pain.   Krill Oil 1000 MG CAPS Take 1,000 mg by mouth daily.    losartan (COZAAR) 25 MG tablet Take 25 mg by mouth daily.   metFORMIN (GLUCOPHAGE-XR) 500 MG 24 hr tablet Take 1,000 mg by mouth 2 (two) times daily.   methocarbamol (ROBAXIN) 500 MG tablet Take 1 tablet (500 mg total) by mouth every 6 (six) hours as needed for muscle  spasms (muscle pain).   metoprolol tartrate (LOPRESSOR) 50 MG tablet Take 1 tablet (50 mg total) by mouth 2 (two) times daily.   Multiple Vitamin (MULTIVITAMIN WITH MINERALS) TABS tablet Take 1 tablet by mouth daily.   polyethylene glycol (MIRALAX / GLYCOLAX) 17 g packet Take 17 g by mouth daily as needed for mild constipation.     Allergies:    Other, Penicillins, and Cefazolin   Social History: Social History   Socioeconomic History   Marital status: Widowed    Spouse name: Not on file   Number of children: Not on file   Years of education: Not on file   Highest education level: Not on file  Occupational History   Not on file  Tobacco Use   Smoking status: Never   Smokeless tobacco: Never  Vaping Use   Vaping Use: Never used  Substance and Sexual Activity   Alcohol use: Not  Currently   Drug use: No   Sexual activity: Not on file  Other Topics Concern   Not on file  Social History Narrative   Retired Naval architect   Social Determinants of Corporate investment banker Strain: Not on file  Food Insecurity: Not on file  Transportation Needs: Not on file  Physical Activity: Not on file  Stress: Not on file  Social Connections: Not on file     Family History: The patient's family history includes Cancer in his mother; Heart attack (age of onset: 19) in his brother.  ROS:   All other ROS reviewed and negative. Pertinent positives noted in the HPI.     EKGs/Labs/Other Studies Reviewed:   The following studies were personally reviewed by me today:  TTE 08/31/2019  1. Left ventricular ejection fraction, by estimation, is 60 to 65%. The  left ventricle has normal function. The left ventricle has no regional  wall motion abnormalities. There is moderate left ventricular hypertrophy.  Left ventricular diastolic  parameters are indeterminate.   2. Right ventricular systolic function is normal. The right ventricular  size is normal.   3. Left atrial size was severely dilated.   4. Right atrial size was mild to moderately dilated.   5. The mitral valve is normal in structure. Trivial mitral valve  regurgitation. No evidence of mitral stenosis.   6. The aortic valve is tricuspid. Aortic valve regurgitation is not  visualized. No aortic stenosis is present.   7. Aortic dilatation noted. 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   8. The inferior vena cava is normal in size with greater than 50%  respiratory variability, suggesting right atrial pressure of 3 mmHg.   Recent Labs: 05/19/2021: ALT 19 07/28/2021: BUN 22; Creatinine, Ser 0.95; Hemoglobin 12.6; Platelets 222; Potassium 4.2; Sodium 139   Recent Lipid Panel No results found for: "CHOL", "TRIG", "HDL", "CHOLHDL", "VLDL", "LDLCALC", "LDLDIRECT"  Physical Exam:   VS:  BP (!) 162/70    Pulse 86   Ht 6' (1.829 m)   Wt 271 lb (122.9 kg)   SpO2 96%   BMI 36.75 kg/m    Wt Readings from Last 3 Encounters:  09/06/21 271 lb (122.9 kg)  07/27/21 267 lb 2 oz (121.2 kg)  07/20/21 267 lb 2 oz (121.2 kg)    General: Well nourished, well developed, in no acute distress Head: Atraumatic, normal size  Eyes: PEERLA, EOMI  Neck: Supple, no JVD Endocrine: No thryomegaly Cardiac: Normal S1, S2; RRR; no murmurs, rubs, or gallops Lungs:  Clear to auscultation bilaterally, no wheezing, rhonchi or rales  Abd: Soft, nontender, no hepatomegaly  Ext: No edema, pulses 2+ Musculoskeletal: No deformities, BUE and BLE strength normal and equal Skin: Warm and dry, no rashes   Neuro: Alert and oriented to person, place, time, and situation, CNII-XII grossly intact, no focal deficits  Psych: Normal mood and affect   ASSESSMENT:   Miguel Cox is a 71 y.o. male who presents for the following: 1. Persistent atrial fibrillation (HCC)   2. Acquired thrombophilia (HCC)   3. Primary hypertension   4. Mixed hyperlipidemia     PLAN:   1. Persistent atrial fibrillation (HCC) 2. Acquired thrombophilia (HCC) -Echocardiogram normal.  Rate control strategy recommended given lack of symptoms.  Seems to be doing well.  Without any issues.  We will continue metoprolol tartrate 50 mg twice daily. -CHA2DS2-VASc equals 3.  Anticoagulation is recommended.  He had issues with Xarelto.  We discussed trying Eliquis.  He is okay to do this.  Stop aspirin.  Start Eliquis 5 mg twice daily.  3. Primary hypertension -BP slightly elevated.  Reports it is controlled at home.  No change to medications.  Continue amlodipine 10 mg daily, losartan 25 mg daily, metoprolol tartrate 50 mg twice daily.  4. Mixed hyperlipidemia -He is diabetic.  He should continue atorvastatin 40 mg daily.  LDL was close to goal at last check.  Disposition: Return in about 6 months (around 03/09/2022).  Medication Adjustments/Labs and  Tests Ordered: Current medicines are reviewed at length with the patient today.  Concerns regarding medicines are outlined above.  No orders of the defined types were placed in this encounter.  Meds ordered this encounter  Medications   apixaban (ELIQUIS) 5 MG TABS tablet    Sig: Take 1 tablet (5 mg total) by mouth 2 (two) times daily.    Dispense:  60 tablet    Refill:  11    Patient Instructions  Medication Instructions:  STOP Aspirin   START Eliquis 5 mg twice a day    30 day Free Trial card given to you    Labwork: None today  Testing/Procedures: None   Follow-Up: 6 months  Any Other Special Instructions Will Be Listed Below (If Applicable).  If you need a refill on your cardiac medications before your next appointment, please call your pharmacy.    Time Spent with Patient: I have spent a total of 25 minutes with patient reviewing hospital notes, telemetry, EKGs, labs and examining the patient as well as establishing an assessment and plan that was discussed with the patient.  > 50% of time was spent in direct patient care.  Signed, Lenna Gilford. Flora Lipps, MD, [facility]  [facility]  [address] [place], [place] [address] [phone]  09/06/2021 2:27 PM

## 2021-09-06 ENCOUNTER — Ambulatory Visit (INDEPENDENT_AMBULATORY_CARE_PROVIDER_SITE_OTHER): Payer: Medicare Other | Admitting: Cardiovascular Disease

## 2021-09-06 ENCOUNTER — Encounter: Payer: Self-pay | Admitting: Cardiovascular Disease

## 2021-09-06 ENCOUNTER — Telehealth: Payer: Self-pay | Admitting: Cardiovascular Disease

## 2021-09-06 VITALS — BP 162/70 | HR 86 | Ht 72.0 in | Wt 271.0 lb

## 2021-09-06 DIAGNOSIS — I4819 Other persistent atrial fibrillation: Secondary | ICD-10-CM

## 2021-09-06 DIAGNOSIS — D6869 Other thrombophilia: Secondary | ICD-10-CM

## 2021-09-06 DIAGNOSIS — E782 Mixed hyperlipidemia: Secondary | ICD-10-CM | POA: Diagnosis not present

## 2021-09-06 DIAGNOSIS — I10 Essential (primary) hypertension: Secondary | ICD-10-CM | POA: Diagnosis not present

## 2021-09-06 MED ORDER — APIXABAN 5 MG PO TABS: 5.0000 mg | ORAL_TABLET | Freq: Two times a day (BID) | ORAL | 11 refills | Status: DC

## 2021-09-06 NOTE — Patient Instructions (Signed)
Medication Instructions:  STOP Aspirin   START Eliquis 5 mg twice a day    30 day Free Trial card given to you    Labwork: None today  Testing/Procedures: None   Follow-Up: 6 months  Any Other Special Instructions Will Be Listed Below (If Applicable).  If you need a refill on your cardiac medications before your next appointment, please call your pharmacy.

## 2021-09-06 NOTE — Telephone Encounter (Signed)
Pt c/o medication issue:  1. Name of Medication: Eliquis  2. How are you currently taking this medication (dosage and times per day)?   3. Are you having a reaction (difficulty breathing--STAT)?   4. What is your medication issue? Too expensive, can not afford it. He says he will need something else

## 2021-09-07 NOTE — Telephone Encounter (Signed)
Left a message for patient to call office back regarding Eliquis. Will offer pt PAF application for medication

## 2022-03-05 ENCOUNTER — Ambulatory Visit: Payer: Medicare Other | Attending: Internal Medicine | Admitting: Internal Medicine

## 2022-03-05 ENCOUNTER — Encounter: Payer: Self-pay | Admitting: Internal Medicine

## 2022-03-05 VITALS — BP 158/80 | HR 64 | Ht 72.0 in | Wt 282.6 lb

## 2022-03-05 DIAGNOSIS — G4733 Obstructive sleep apnea (adult) (pediatric): Secondary | ICD-10-CM | POA: Diagnosis present

## 2022-03-05 DIAGNOSIS — I77819 Aortic ectasia, unspecified site: Secondary | ICD-10-CM

## 2022-03-05 DIAGNOSIS — M7989 Other specified soft tissue disorders: Secondary | ICD-10-CM | POA: Diagnosis present

## 2022-03-05 DIAGNOSIS — I4819 Other persistent atrial fibrillation: Secondary | ICD-10-CM | POA: Insufficient documentation

## 2022-03-05 DIAGNOSIS — E7849 Other hyperlipidemia: Secondary | ICD-10-CM | POA: Insufficient documentation

## 2022-03-05 DIAGNOSIS — I10 Essential (primary) hypertension: Secondary | ICD-10-CM | POA: Insufficient documentation

## 2022-03-05 DIAGNOSIS — E785 Hyperlipidemia, unspecified: Secondary | ICD-10-CM | POA: Insufficient documentation

## 2022-03-05 MED ORDER — LOSARTAN POTASSIUM 50 MG PO TABS: 50.0000 mg | ORAL_TABLET | Freq: Every day | ORAL | 6 refills | Status: DC

## 2022-03-05 MED ORDER — WARFARIN SODIUM 3 MG PO TABS
3.0000 mg | ORAL_TABLET | Freq: Every day | ORAL | 3 refills | Status: DC
Start: 2022-03-05 — End: 2022-03-20

## 2022-03-05 NOTE — Patient Instructions (Addendum)
Medication Instructions:  Your physician has recommended you make the following change in your medication:  Stop aspirin Start warfarin 3 mg daily Stop amlodipine Increase losartan to 50 mg daily Continue other medications the same  Labwork: none  Testing/Procedures: none  Follow-Up: Your physician recommends that you schedule a follow-up appointment in: 10 months  Any Other Special Instructions Will Be Listed Below (If Applicable). You have been referred to Anticoagulation Clinic  If you need a refill on your cardiac medications before your next appointment, please call your pharmacy.

## 2022-03-05 NOTE — Progress Notes (Signed)
Cardiology Office Note  Date: 03/05/2022   ID: Miguel Cox, DOB Jul 31, 1950, MRN [id number]  PCP:  Leeanne Rio, MD  Cardiologist:  Chalmers Guest, MD Electrophysiologist:  None   Reason for Office Visit: Persistent A-fib follow-up   History of Present Illness: Miguel Cox is a 72 y.o. male known to have HTN, DM 2, persistent atrial fibrillation (intolerant to Xarelto), OSA intolerant to CPAP presented to cardiology clinic for follow-up visit.  Patient denies any symptoms of SOB, angina, dizziness, lightheadedness, syncope and Leg swelling.  He is currently on metoprolol tartrate 100 mg twice a day and previously started on Eliquis for stroke prophylaxis. However due to cost issues (has to be 2 $50 every 2 months for Eliquis purchase), he stopped Eliquis and started aspirin 81 mg once daily.  Past Medical History:  Diagnosis Date   Atrial fibrillation ([place])    Diagnosed 2020   Diverticula of intestine    DJD (degenerative joint disease)    Dysrhythmia    Essential hypertension    Gout    OSA (obstructive sleep apnea)    Intolerant of CPAP   Osteoarthritis    Polio    Type 2 diabetes mellitus ([place])     Past Surgical History:  Procedure Laterality Date   removal of toe nail     age 68 for fungus   SHOULDER ARTHROSCOPY Right 2011   right   TOTAL HIP ARTHROPLASTY Right 07/27/2021   Procedure: TOTAL HIP ARTHROPLASTY ANTERIOR APPROACH;  Surgeon: Paralee Cancel, MD;  Location: [facility];  Service: Orthopedics;  Laterality: Right;   TOTAL KNEE ARTHROPLASTY Left 01/29/2009   Dr. Tonita Cong    Current Outpatient Medications  Medication Sig Dispense Refill   amLODipine (NORVASC) 10 MG tablet Take 10 mg by mouth daily.  3   aspirin EC 81 MG tablet Take 81 mg by mouth 2 (two) times daily. Swallow whole.     atorvastatin (LIPITOR) 40 MG tablet Take 40 mg by mouth at bedtime.  3   Cholecalciferol (VITAMIN D3 PO) Take 1 tablet by mouth daily.     colchicine 0.6 MG  tablet Take 0.6 mg by mouth daily as needed (gout).      Cyanocobalamin (B-12 PO) Take 1 tablet by mouth daily.     EPINEPHrine (EPIPEN 2-PAK) 0.3 mg/0.3 mL IJ SOAJ injection Inject 0.3 mLs (0.3 mg total) into the muscle once. 1 Device 1   glipiZIDE (GLUCOTROL XL) 10 MG 24 hr tablet Take 10 mg by mouth daily.     HYDROcodone-acetaminophen (NORCO/VICODIN) 5-325 MG tablet Take 1 tablet by mouth every 4 (four) hours as needed for severe pain. 42 tablet 0   Krill Oil 1000 MG CAPS Take 1,000 mg by mouth daily.      losartan (COZAAR) 25 MG tablet Take 25 mg by mouth daily.  3   metFORMIN (GLUCOPHAGE-XR) 500 MG 24 hr tablet Take 1,000 mg by mouth 2 (two) times daily.     methocarbamol (ROBAXIN) 500 MG tablet Take 1 tablet (500 mg total) by mouth every 6 (six) hours as needed for muscle spasms (muscle pain). 40 tablet 0   metoprolol tartrate (LOPRESSOR) 100 MG tablet Take 100 mg by mouth 2 (two) times daily.     Multiple Vitamin (MULTIVITAMIN WITH MINERALS) TABS tablet Take 1 tablet by mouth daily.     polyethylene glycol (MIRALAX / GLYCOLAX) 17 g packet Take 17 g by mouth daily as needed for mild constipation. 14 each 0  sildenafil (VIAGRA) 100 MG tablet Take 1 tablet by mouth daily as needed.     traMADol (ULTRAM) 50 MG tablet Take 50 mg by mouth daily as needed.     apixaban (ELIQUIS) 5 MG TABS tablet Take 1 tablet (5 mg total) by mouth 2 (two) times daily. (Patient not taking: Reported on 03/05/2022) 60 tablet 11   No current facility-administered medications for this visit.   Allergies:  Other, Penicillins, and Cefazolin   Social History: The patient  reports that he has never smoked. He has never used smokeless tobacco. He reports that he does not currently use alcohol. He reports that he does not use drugs.   Family History: The patient's family history includes Cancer in his mother; Heart attack (age of onset: 59) in his brother.   ROS:  Please see the history of present illness. Otherwise,  complete review of systems is positive for none.  All other systems are reviewed and negative.   Physical Exam: VS:  BP (!) 158/80   Pulse 64   Ht 6' (1.829 m)   Wt 282 lb 9.6 oz (128.2 kg)   SpO2 95%   BMI 38.33 kg/m , BMI Body mass index is 38.33 kg/m.  Wt Readings from Last 3 Encounters:  03/05/22 282 lb 9.6 oz (128.2 kg)  09/06/21 271 lb (122.9 kg)  07/27/21 267 lb 2 oz (121.2 kg)    General: Patient appears comfortable at rest. HEENT: Conjunctiva and lids normal, oropharynx clear with moist mucosa. Neck: Supple, no elevated JVP or carotid bruits, no thyromegaly. Lungs: Clear to auscultation, nonlabored breathing at rest. Cardiac: Regular rate and rhythm, no S3 or significant systolic murmur, no pericardial rub. Abdomen: Soft, nontender, no hepatomegaly, bowel sounds present, no guarding or rebound. Extremities: 2+ pitting edema in bilateral lower extremities, distal pulses 2+. Skin: Warm and dry. Musculoskeletal: No kyphosis. Neuropsychiatric: Alert and oriented x3, affect grossly appropriate.  ECG:  An ECG dated 03/05/2022 was personally reviewed today and demonstrated:  Atrial fibrillation  Recent Labwork: 05/19/2021: ALT 19; AST 19 07/28/2021: BUN 22; Creatinine, Ser 0.95; Hemoglobin 12.6; Platelets 222; Potassium 4.2; Sodium 139  No results found for: "CHOL", "TRIG", "HDL", "CHOLHDL", "VLDL", "LDLCALC", "LDLDIRECT"  Other Studies Reviewed Today: Echocardiogram from 2021 LVEF normal, moderate LVH Indeterminate diastolic parameters No valve abnormalities Mild dilatation of the aortic root, 42 mm  Assessment and Plan: Patient is a 72 year old M known to have HTN, DM 2, persistent atrial fibrillation (intolerant to Xarelto), OSA intolerant to CPAP presented to cardiology clinic for follow-up visit.  # Persistent A-fib (EKG today showed atrial fibrillation, rate controlled) -Continue metoprolol tartrate 100 mg twice daily -Intolerant to Xarelto and Eliquis being cost  prohibitive, will start him on Coumadin 3 mg once daily for stroke prophylaxis. Goal INR should be between 2 and 3. Ambulatory referral to Coumadin clinic in [place].  # Bilateral lower EXTR swelling -Patient has no symptoms of SOB/DOE. Unclear if this is side effect of amlodipine. Will stop amlodipine and reassess.  # Dilatation of the aortic root, 42 mm per echo from 2021 -Obtain CTA aorta in the next clinic visit  # HTN, controlled -Stop amlodipine due to leg swelling and increase the dose of losartan from 25 mg to 50 mg once daily -Continue metoprolol tartrate 100 mg twice daily  # HLD -Continue atorvastatin 40 mg nightly -Management of HLD per PCP  # OSA -Intolerant to CPAP.  I have spent a total of 33 minutes with patient reviewing chart, EKGs,  labs and examining patient as well as establishing an assessment and plan that was discussed with the patient.  > 50% of time was spent in direct patient care.      Medication Adjustments/Labs and Tests Ordered: Current medicines are reviewed at length with the patient today.  Concerns regarding medicines are outlined above.   Tests Ordered: Orders Placed This Encounter  Procedures   EKG 12-Lead    Medication Changes: No orders of the defined types were placed in this encounter.   Disposition:  Follow up  10 months  Signed Buffey Zabinski Fidel Levy, MD, 03/05/2022 3:23 PM    [facility], [address], [place] [address]

## 2022-03-12 ENCOUNTER — Ambulatory Visit: Payer: Medicare Other | Attending: Internal Medicine | Admitting: *Deleted

## 2022-03-12 DIAGNOSIS — Z5181 Encounter for therapeutic drug level monitoring: Secondary | ICD-10-CM | POA: Diagnosis not present

## 2022-03-12 DIAGNOSIS — I4819 Other persistent atrial fibrillation: Secondary | ICD-10-CM | POA: Insufficient documentation

## 2022-03-12 LAB — POCT INR: INR: 1 — AB (ref 2.0–3.0)

## 2022-03-12 NOTE — Patient Instructions (Addendum)
Started warfarin 82m daily on 03/07/22.  Has had 6 doses.  Took it this morning. Take warfarin 1 extra tablet tonight then increase dose to 1.5 tablets daily. Start taking warfarin at night Be consistent with greens/salads Recheck in 1 wk

## 2022-03-20 ENCOUNTER — Ambulatory Visit: Payer: Medicare Other | Attending: Internal Medicine | Admitting: *Deleted

## 2022-03-20 DIAGNOSIS — Z5181 Encounter for therapeutic drug level monitoring: Secondary | ICD-10-CM

## 2022-03-20 DIAGNOSIS — I4819 Other persistent atrial fibrillation: Secondary | ICD-10-CM

## 2022-03-20 LAB — POCT INR: INR: 1.1 — AB (ref 2.0–3.0)

## 2022-03-20 MED ORDER — WARFARIN SODIUM 3 MG PO TABS: ORAL_TABLET | ORAL | 3 refills | Status: DC

## 2022-03-20 MED ORDER — WARFARIN SODIUM 3 MG PO TABS: 3.0000 mg | ORAL_TABLET | Freq: Every day | ORAL | 3 refills | Status: DC

## 2022-03-20 NOTE — Patient Instructions (Signed)
Started warfarin 74m daily on 03/07/22.   Increase warfarin to 2 tablets daily Continue taking warfarin at night Be consistent with greens/salads Recheck in 1 wk

## 2022-03-27 ENCOUNTER — Ambulatory Visit: Payer: Medicare Other | Attending: Internal Medicine | Admitting: Pharmacist

## 2022-03-27 DIAGNOSIS — Z5181 Encounter for therapeutic drug level monitoring: Secondary | ICD-10-CM | POA: Insufficient documentation

## 2022-03-27 DIAGNOSIS — I4819 Other persistent atrial fibrillation: Secondary | ICD-10-CM | POA: Insufficient documentation

## 2022-03-27 LAB — POCT INR: INR: 1.3 — AB (ref 2.0–3.0)

## 2022-03-27 NOTE — Patient Instructions (Signed)
Description   Started warfarin '3mg'$  daily on 03/07/22.   Increase warfarin to 2.5 tablets daily Continue taking warfarin at night Be consistent with greens/salads Recheck in 1 wk

## 2022-03-29 ENCOUNTER — Other Ambulatory Visit: Payer: Self-pay | Admitting: Internal Medicine

## 2022-04-03 ENCOUNTER — Ambulatory Visit: Payer: Medicare Other | Attending: Internal Medicine | Admitting: *Deleted

## 2022-04-03 DIAGNOSIS — Z5181 Encounter for therapeutic drug level monitoring: Secondary | ICD-10-CM | POA: Insufficient documentation

## 2022-04-03 DIAGNOSIS — I4819 Other persistent atrial fibrillation: Secondary | ICD-10-CM

## 2022-04-03 LAB — POCT INR: INR: 2.1 (ref 2.0–3.0)

## 2022-04-03 NOTE — Patient Instructions (Signed)
Started warfarin '3mg'$  daily on 03/07/22.   Continue warfarin 2.5 tablets daily Continue taking warfarin at night Be consistent with greens/salads Recheck in 1 wk

## 2022-04-11 ENCOUNTER — Ambulatory Visit: Payer: Medicare Other | Attending: Internal Medicine | Admitting: *Deleted

## 2022-04-11 ENCOUNTER — Other Ambulatory Visit: Payer: Self-pay | Admitting: Internal Medicine

## 2022-04-11 DIAGNOSIS — Z5181 Encounter for therapeutic drug level monitoring: Secondary | ICD-10-CM | POA: Diagnosis present

## 2022-04-11 DIAGNOSIS — I4819 Other persistent atrial fibrillation: Secondary | ICD-10-CM

## 2022-04-11 LAB — POCT INR: INR: 1.9 — AB (ref 2.0–3.0)

## 2022-04-11 NOTE — Patient Instructions (Signed)
Started warfarin '3mg'$  daily on 03/07/22.   Increase warfarin to 2 1/2 tablets daily except 3 tablets on Mondays, Wednesdays and Fridays Continue taking warfarin at night Be consistent with greens/salads Recheck in 1 wk

## 2022-04-23 ENCOUNTER — Ambulatory Visit: Payer: Medicare Other | Attending: Internal Medicine | Admitting: *Deleted

## 2022-04-23 DIAGNOSIS — Z5181 Encounter for therapeutic drug level monitoring: Secondary | ICD-10-CM | POA: Diagnosis present

## 2022-04-23 DIAGNOSIS — I4819 Other persistent atrial fibrillation: Secondary | ICD-10-CM | POA: Insufficient documentation

## 2022-04-23 LAB — POCT INR: INR: 2.6 (ref 2.0–3.0)

## 2022-04-23 NOTE — Patient Instructions (Signed)
Started warfarin 3mg  daily on 03/07/22.   Continue warfarin 2 1/2 tablets daily except 3 tablets on Mondays, Wednesdays and Fridays Continue taking warfarin at night Be consistent with greens/salads Recheck in 2 wks

## 2022-05-06 ENCOUNTER — Other Ambulatory Visit: Payer: Self-pay | Admitting: Internal Medicine

## 2022-05-06 DIAGNOSIS — I4819 Other persistent atrial fibrillation: Secondary | ICD-10-CM

## 2022-05-07 NOTE — Telephone Encounter (Signed)
Prescription refill request received for warfarin Lov:  03/05/22 (Mallipeddi)  Next INR check: 05/09/22 Warfarin tablet strength: 3mg     Appropriate dose. Refill sent.

## 2022-05-09 ENCOUNTER — Ambulatory Visit: Payer: Medicare Other | Attending: Internal Medicine | Admitting: *Deleted

## 2022-05-09 DIAGNOSIS — Z5181 Encounter for therapeutic drug level monitoring: Secondary | ICD-10-CM

## 2022-05-09 DIAGNOSIS — I4819 Other persistent atrial fibrillation: Secondary | ICD-10-CM | POA: Diagnosis present

## 2022-05-09 LAB — POCT INR: INR: 4 — AB (ref 2.0–3.0)

## 2022-05-09 NOTE — Patient Instructions (Signed)
Started warfarin 3mg  daily on 03/07/22.   Hold warfarin tonight then resume 2 1/2 tablets daily except 3 tablets on Mondays, Wednesdays and Fridays Continue taking warfarin at night Be consistent with greens/salads Recheck in 2 wks

## 2022-05-21 ENCOUNTER — Ambulatory Visit: Payer: Medicare Other | Attending: Internal Medicine | Admitting: *Deleted

## 2022-05-21 DIAGNOSIS — Z5181 Encounter for therapeutic drug level monitoring: Secondary | ICD-10-CM | POA: Diagnosis present

## 2022-05-21 DIAGNOSIS — I4819 Other persistent atrial fibrillation: Secondary | ICD-10-CM | POA: Diagnosis present

## 2022-05-21 LAB — POCT INR: INR: 2.5 (ref 2.0–3.0)

## 2022-05-21 NOTE — Patient Instructions (Signed)
Started warfarin  daily on 03/07/22.   Continue warfarin 2 1/2 tablets daily except 3 tablets on Mondays, Wednesdays and Fridays Continue taking warfarin at night Be consistent with greens/salads Recheck in 3 wks

## 2022-06-11 ENCOUNTER — Ambulatory Visit: Payer: Medicare Other | Attending: Internal Medicine | Admitting: *Deleted

## 2022-06-11 DIAGNOSIS — Z5181 Encounter for therapeutic drug level monitoring: Secondary | ICD-10-CM | POA: Diagnosis present

## 2022-06-11 DIAGNOSIS — I4819 Other persistent atrial fibrillation: Secondary | ICD-10-CM | POA: Diagnosis present

## 2022-06-11 LAB — POCT INR: INR: 2.7 (ref 2.0–3.0)

## 2022-06-11 NOTE — Patient Instructions (Signed)
Continue warfarin 2 1/2 tablets daily except 3 tablets on Mondays, Wednesdays and Fridays Continue taking warfarin at night Be consistent with greens/salads Recheck in 4 wks

## 2022-07-09 ENCOUNTER — Ambulatory Visit: Payer: Medicare Other | Attending: Internal Medicine | Admitting: *Deleted

## 2022-07-09 DIAGNOSIS — Z5181 Encounter for therapeutic drug level monitoring: Secondary | ICD-10-CM | POA: Insufficient documentation

## 2022-07-09 DIAGNOSIS — I4819 Other persistent atrial fibrillation: Secondary | ICD-10-CM | POA: Insufficient documentation

## 2022-07-09 LAB — POCT INR: INR: 2.1 (ref 2.0–3.0)

## 2022-07-09 NOTE — Patient Instructions (Signed)
Continue warfarin 2 1/2 tablets daily except 3 tablets on Mondays, Wednesdays and Fridays Continue taking warfarin at night Be consistent with greens/salads Recheck in 6 wks

## 2022-07-12 ENCOUNTER — Other Ambulatory Visit: Payer: Self-pay | Admitting: Internal Medicine

## 2022-07-12 DIAGNOSIS — I4819 Other persistent atrial fibrillation: Secondary | ICD-10-CM

## 2022-07-12 NOTE — Telephone Encounter (Signed)
Refill request for warfarin:  Last INR was 2.1 on 07/09/22 Next INR due 08/20/22 LOV was 03/05/22  Refill approved.

## 2022-08-20 ENCOUNTER — Ambulatory Visit: Payer: Medicare Other | Attending: Internal Medicine | Admitting: *Deleted

## 2022-08-20 DIAGNOSIS — Z5181 Encounter for therapeutic drug level monitoring: Secondary | ICD-10-CM | POA: Diagnosis not present

## 2022-08-20 DIAGNOSIS — I4819 Other persistent atrial fibrillation: Secondary | ICD-10-CM | POA: Insufficient documentation

## 2022-08-20 LAB — POCT INR: INR: 3 (ref 2.0–3.0)

## 2022-08-20 NOTE — Patient Instructions (Signed)
Continue warfarin 2 1/2 tablets daily except 3 tablets on Mondays, Wednesdays and Fridays Continue taking warfarin at night Be consistent with greens/salads Recheck in 6 wks

## 2022-10-02 ENCOUNTER — Ambulatory Visit: Payer: Medicare Other | Attending: Internal Medicine | Admitting: *Deleted

## 2022-10-02 DIAGNOSIS — Z5181 Encounter for therapeutic drug level monitoring: Secondary | ICD-10-CM | POA: Insufficient documentation

## 2022-10-02 DIAGNOSIS — I4819 Other persistent atrial fibrillation: Secondary | ICD-10-CM | POA: Insufficient documentation

## 2022-10-02 LAB — POCT INR: INR: 3.1 — AB (ref 2.0–3.0)

## 2022-10-02 NOTE — Patient Instructions (Signed)
Decrease warfarin to 2 1/2 tablets daily except 3 tablets on Mondays. Continue taking warfarin at night Be consistent with greens/salads Recheck in 6 wks

## 2022-10-30 ENCOUNTER — Other Ambulatory Visit: Payer: Self-pay | Admitting: Internal Medicine

## 2022-10-30 DIAGNOSIS — I4819 Other persistent atrial fibrillation: Secondary | ICD-10-CM

## 2022-10-30 NOTE — Telephone Encounter (Signed)
Refill request for warfarin:  Last INR was 3.1 on 10/02/22 Next INR due 11/13/22 LOV was 03/05/22  Refill approved.

## 2022-11-13 ENCOUNTER — Ambulatory Visit: Payer: Medicare Other | Attending: Internal Medicine | Admitting: *Deleted

## 2022-11-13 DIAGNOSIS — Z5181 Encounter for therapeutic drug level monitoring: Secondary | ICD-10-CM

## 2022-11-13 DIAGNOSIS — I4819 Other persistent atrial fibrillation: Secondary | ICD-10-CM | POA: Diagnosis not present

## 2022-11-13 LAB — POCT INR: INR: 2 (ref 2.0–3.0)

## 2022-11-13 NOTE — Patient Instructions (Signed)
Continue warfarin 2 1/2 tablets daily except 3 tablets on Mondays. Continue taking warfarin at night Be consistent with greens/salads Recheck in 6 wks

## 2022-12-25 ENCOUNTER — Ambulatory Visit: Payer: Medicare Other

## 2022-12-26 ENCOUNTER — Ambulatory Visit: Payer: Medicare Other | Attending: Internal Medicine | Admitting: *Deleted

## 2022-12-26 DIAGNOSIS — I4819 Other persistent atrial fibrillation: Secondary | ICD-10-CM | POA: Diagnosis not present

## 2022-12-26 DIAGNOSIS — Z5181 Encounter for therapeutic drug level monitoring: Secondary | ICD-10-CM | POA: Diagnosis not present

## 2022-12-26 LAB — POCT INR: INR: 4.3 — AB (ref 2.0–3.0)

## 2022-12-26 NOTE — Patient Instructions (Signed)
Hold warfarin tonight then resume 2 1/2 tablets daily except 3 tablets on Mondays. Continue taking warfarin at night Eat extra greens/salads tonight  Recheck in 3 wks

## 2023-01-16 ENCOUNTER — Ambulatory Visit: Payer: Medicare Other | Attending: Internal Medicine | Admitting: *Deleted

## 2023-01-16 DIAGNOSIS — Z5181 Encounter for therapeutic drug level monitoring: Secondary | ICD-10-CM | POA: Diagnosis present

## 2023-01-16 DIAGNOSIS — I4819 Other persistent atrial fibrillation: Secondary | ICD-10-CM

## 2023-01-16 LAB — POCT INR: INR: 2 (ref 2.0–3.0)

## 2023-01-16 NOTE — Patient Instructions (Signed)
Continue 2 1/2 tablets daily except 3 tablets on Mondays. Continue taking warfarin at night Eat extra greens/salads tonight  Recheck in 4wks

## 2023-02-13 ENCOUNTER — Ambulatory Visit: Payer: Medicare Other | Attending: Internal Medicine | Admitting: *Deleted

## 2023-02-13 DIAGNOSIS — I4819 Other persistent atrial fibrillation: Secondary | ICD-10-CM | POA: Diagnosis not present

## 2023-02-13 DIAGNOSIS — Z5181 Encounter for therapeutic drug level monitoring: Secondary | ICD-10-CM | POA: Insufficient documentation

## 2023-02-13 LAB — POCT INR: INR: 2 (ref 2.0–3.0)

## 2023-02-13 NOTE — Patient Instructions (Signed)
 Continue 2 1/2 tablets daily except 3 tablets on Mondays. Continue taking warfarin at night Be consistent with Vit K foods Recheck in 4wks

## 2023-02-21 ENCOUNTER — Telehealth: Payer: Self-pay | Admitting: *Deleted

## 2023-02-21 NOTE — Telephone Encounter (Signed)
Will send message to [facility] office to see if they can reach out to the pt with a sooner appt as pt is in great pain and needs his shoulder surgery.

## 2023-02-21 NOTE — Telephone Encounter (Signed)
   Pre-operative Risk Assessment    Patient Name: Miguel Cox  DOB: 04-27-1950 MRN: [id number]   Date of last office visit: 03/05/22 DR. MALLIPEDDI Date of next office visit: NONE   Request for Surgical Clearance    Procedure:   LEFT REVERSE TOTAL SHOULDER ARTHROPLASTY  Date of Surgery:  Clearance TBD                                Surgeon:  DR. Malon Kindle Surgeon's Group or Practice Name:  [facility] Phone number:  [phone] ATTN: KERRI MAZE Fax number:  [phone]   Type of Clearance Requested:   - Medical  - Pharmacy:  Hold Warfarin (Coumadin)     Type of Anesthesia:   CHOICE   Additional requests/questions:    [name]   02/21/2023, 4:58 PM

## 2023-02-21 NOTE — Telephone Encounter (Signed)
   Name: Miguel Cox  DOB: 07/04/1950  MRN: [id number]  Primary Cardiologist: Marjo Bicker, MD  Chart reviewed as part of pre-operative protocol coverage. Because of Oather Gonsalves Mistry's past medical history and time since last visit, he will require a follow-up in-office visit in order to better assess preoperative cardiovascular risk.  Patient will also need updated CBC for guidance on holding Coumadin.  Pre-op covering staff: - Please schedule appointment and call patient to inform them. If patient already had an upcoming appointment within acceptable timeframe, please add "pre-op clearance" to the appointment notes so provider is aware. - Please contact requesting surgeon's office via preferred method (i.e, phone, fax) to inform them of need for appointment prior to surgery.  t.   Napoleon Form, Leodis Rains, NP  02/21/2023, 5:12 PM

## 2023-02-22 ENCOUNTER — Telehealth: Payer: Self-pay | Admitting: Nurse Practitioner

## 2023-02-22 ENCOUNTER — Ambulatory Visit: Payer: Medicare Other | Attending: Nurse Practitioner | Admitting: Nurse Practitioner

## 2023-02-22 ENCOUNTER — Encounter: Payer: Self-pay | Admitting: Nurse Practitioner

## 2023-02-22 VITALS — BP 140/74 | HR 76 | Ht 72.0 in | Wt 279.8 lb

## 2023-02-22 DIAGNOSIS — I10 Essential (primary) hypertension: Secondary | ICD-10-CM | POA: Diagnosis present

## 2023-02-22 DIAGNOSIS — I4819 Other persistent atrial fibrillation: Secondary | ICD-10-CM | POA: Diagnosis present

## 2023-02-22 DIAGNOSIS — Z01810 Encounter for preprocedural cardiovascular examination: Secondary | ICD-10-CM

## 2023-02-22 DIAGNOSIS — E669 Obesity, unspecified: Secondary | ICD-10-CM | POA: Diagnosis present

## 2023-02-22 DIAGNOSIS — E785 Hyperlipidemia, unspecified: Secondary | ICD-10-CM | POA: Diagnosis present

## 2023-02-22 DIAGNOSIS — I77819 Aortic ectasia, unspecified site: Secondary | ICD-10-CM

## 2023-02-22 DIAGNOSIS — I77810 Thoracic aortic ectasia: Secondary | ICD-10-CM

## 2023-02-22 NOTE — Patient Instructions (Signed)
Medication Instructions:  Your physician recommends that you continue on your current medications as directed. Please refer to the Current Medication list given to you today.   Labwork: CBC and BMET to be completed 1-2 weeks at [facility]  Testing/Procedures: Non-Cardiac CT Angiography (CTA), is a special type of CT scan that uses a computer to produce multi-dimensional views of major blood vessels throughout the body. In CT angiography, a contrast material is injected through an IV to help visualize the blood vessels   Follow-Up: Your physician recommends that you schedule a follow-up appointment in: 6 months   Any Other Special Instructions Will Be Listed Below (If Applicable). Thank you for choosing [facility]!      If you need a refill on your cardiac medications before your next appointment, please call your pharmacy.

## 2023-02-22 NOTE — Progress Notes (Addendum)
 Cardiology Office Note:  .   Date: 02/22/2023 ID:  Miguel Cox, DOB 19-Sep-1950, MRN [id number] PCP: Catalina Lunger, DO  [facility] Providers Cardiologist:  Marjo Bicker, MD    History of Present Illness: .   Miguel Cox is a 73 y.o. male with a PMH of persistent A-fib, hypertension, type 2 diabetes, OSA on CPAP, who presents today for preoperative cardiovascular risk assessment.  Last seen by Dr. Jenene Slicker on March 05, 2022.  Was overall doing well at the time.  He had stopped taking Eliquis due to cost and had started a baby aspirin daily.  Was started on Coumadin for stroke prophylaxis.  Amlodipine was stopped due to bilateral lower extremity swelling.  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  Losartan was increased to 50 mg daily.  Today he presents for preoperative cardiovascular risk assessment.  He is pending left reverse total shoulder arthroplasty for surgery date of TBD.  Surgeon will be Dr. Patsy Lager of [facility].  Our office has been contacting regarding clearance.  He states he has been doing well since last office visit.  Denies any acute cardiac complaints or concerns.  Says SBP averages upper 130s to low 140s at home. Denies any chest pain, shortness of breath, palpitations, syncope, presyncope, dizziness, orthopnea, PND, swelling or significant weight changes, acute bleeding, or claudication.    ROS: Negative.  See HPI. SH: Worked as a Librarian, academic for 20 years  Studies Reviewed: Marland Kitchen    EKG: EKG Interpretation Date/Time:  Friday February 22 2023 15:49:49 EST Ventricular Rate:  65 PR Interval:    QRS Duration:  108 QT Interval:  406 QTC Calculation: 422 R Axis:   99  Text Interpretation: Atrial fibrillation Rightward axis When compared with ECG of 19-May-2021 08:32, No significant change was found Confirmed by Sharlene Dory [phone]) on 02/22/2023 4:07:02 PM   Echo 08/2019:   1.  Left ventricular ejection fraction, by estimation, is 60 to 65%. The  left ventricle has normal function. The left ventricle has no regional  wall motion abnormalities. There is moderate left ventricular hypertrophy.  Left ventricular diastolic  parameters are indeterminate.   2. Right ventricular systolic function is normal. The right ventricular  size is normal.   3. Left atrial size was severely dilated.   4. Right atrial size was mild to moderately dilated.   5. The mitral valve is normal in structure. Trivial mitral valve  regurgitation. No evidence of mitral stenosis.   6. The aortic valve is tricuspid. Aortic valve regurgitation is not  visualized. No aortic stenosis is present.   7. Aortic dilatation noted. 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   8. The inferior vena cava is normal in size with greater than 50%  respiratory variability, suggesting right atrial pressure of 3 mmHg.   Physical Exam:   VS:  BP (!) 140/74   Pulse 76   Ht 6' (1.829 m)   Wt 279 lb 12.8 oz (126.9 kg)   SpO2 95%   BMI 37.95 kg/m    Wt Readings from Last 3 Encounters:  02/22/23 279 lb 12.8 oz (126.9 kg)  03/05/22 282 lb 9.6 oz (128.2 kg)  09/06/21 271 lb (122.9 kg)    GEN: Obese, 73 year old male in no acute distress NECK: No JVD; No carotid bruits CARDIAC: S1/S2, irregularly irregular rhythm, no murmurs, rubs, gallops RESPIRATORY:  Clear  to auscultation without rales, wheezing or rhonchi  ABDOMEN: Soft, non-tender, non-distended EXTREMITIES:  No edema; No deformity   ASSESSMENT AND PLAN: .    Preoperative cardiovascular risk assessment Miguel Cox perioperative risk of a major cardiac event is 0.4% according to the Revised Cardiac Risk Index (RCRI).  Therefore, he is at low risk for perioperative complications.   His functional capacity is excellent at 6.61 METs according to the Duke Activity Status Index (DASI). Recommendations: According to ACC/AHA guidelines, no  further cardiovascular testing needed.  The patient may proceed to surgery at acceptable risk.   Antiplatelet and/or Anticoagulation Recommendations: Per office protocol, patient can hold warfarin for 5 days prior to procedure.  Patient will NOT need bridging with Lovenox (enoxaparin) around procedure. Will route note to requesting party.   2. Persistent to permanent A-fib Denies any tachycardia or palpitations.  Heart rate is well-controlled.  Continue Lopressor.  Continue to follow-up with Coumadin clinic.  Denies any bleeding issues on Coumadin.  Will obtain CBC and BMET for further evaluation. Heart healthy diet and regular cardiovascular exercise encouraged.   3. HTN BP borderline elevated today.  Discussed SBP goal is less than 140.  He states overall BP is fairly well-controlled at home. Discussed to monitor BP at home at least 2 hours after medications and sitting for 5-10 minutes.  Continue current medication regimen.  Discussed to contact our office if SBP is consistently greater than 140.  He verbalized understanding. Heart healthy diet and regular cardiovascular exercise encouraged.   4.  Hyperlipidemia LDL 140 in June 2024.  Goal LDL is less than 100.  This is being managed by PCP.  Continue atorvastatin 40 mg daily.  5.  Dilated aortic root Echo in 2021 revealed aortic root measuring 42 mm.  Was recommended to obtain CTA aorta at next office visit.  Will arrange his at this time.  Care and ED precautions discussed.  He verbalized understanding.  6. Obesity Weight loss via diet and exercise encouraged. Discussed the impact being overweight would have on cardiovascular risk.  Dispo: Follow-up with Dr. Jenene Slicker or APP in 6 months or sooner if anything changes.  Signed, Sharlene Dory, NP

## 2023-02-22 NOTE — Telephone Encounter (Signed)
Checking percert on the following patient for testing scheduled at [facility].    CT ANGIO CHEST AORTA -----03/27/23

## 2023-03-11 ENCOUNTER — Other Ambulatory Visit (HOSPITAL_COMMUNITY)
Admission: RE | Admit: 2023-03-11 | Discharge: 2023-03-11 | Disposition: A | Discharge status: Home / Self Care | Payer: Medicare Other | Source: Ambulatory Visit | Attending: Nurse Practitioner | Admitting: Nurse Practitioner

## 2023-03-11 DIAGNOSIS — Z01810 Encounter for preprocedural cardiovascular examination: Secondary | ICD-10-CM | POA: Insufficient documentation

## 2023-03-11 LAB — CBC
HCT: 35.8 % — ABNORMAL LOW (ref 39.0–52.0)
Hemoglobin: 11.8 g/dL — ABNORMAL LOW (ref 13.0–17.0)
MCH: 31.3 pg (ref 26.0–34.0)
MCHC: 33 g/dL (ref 30.0–36.0)
MCV: 95 fL (ref 80.0–100.0)
Platelets: 227 10*3/uL (ref 150–400)
RBC: 3.77 MIL/uL — ABNORMAL LOW (ref 4.22–5.81)
RDW: 15.9 % — ABNORMAL HIGH (ref 11.5–15.5)
WBC: 9.8 10*3/uL (ref 4.0–10.5)
nRBC: 0 % (ref 0.0–0.2)

## 2023-03-11 LAB — BASIC METABOLIC PANEL
Anion gap: 10 (ref 5–15)
BUN: 23 mg/dL (ref 8–23)
CO2: 26 mmol/L (ref 22–32)
Calcium: 8.9 mg/dL (ref 8.9–10.3)
Chloride: 102 mmol/L (ref 98–111)
Creatinine, Ser: 1.1 mg/dL (ref 0.61–1.24)
GFR, Estimated: >60 mL/min (ref >60)
Glucose, Bld: 134 mg/dL — ABNORMAL HIGH (ref 70–99)
Potassium: 4 mmol/L (ref 3.5–5.1)
Sodium: 138 mmol/L (ref 135–145)

## 2023-03-13 ENCOUNTER — Ambulatory Visit: Payer: Medicare Other | Attending: Internal Medicine | Admitting: *Deleted

## 2023-03-13 DIAGNOSIS — Z5181 Encounter for therapeutic drug level monitoring: Secondary | ICD-10-CM | POA: Insufficient documentation

## 2023-03-13 DIAGNOSIS — I4819 Other persistent atrial fibrillation: Secondary | ICD-10-CM | POA: Insufficient documentation

## 2023-03-13 LAB — POCT INR: INR: 2.9 (ref 2.0–3.0)

## 2023-03-13 NOTE — Patient Instructions (Signed)
Continue 2 1/2 tablets daily except 3 tablets on Mondays. Continue taking warfarin at night Be consistent with Vit K foods Recheck in 6 wks

## 2023-03-20 NOTE — Progress Notes (Signed)
 Labs have been updated. See result note. Will route note to clinical pharm D pre-op pool to determine holding time of Coumadin.   Thanks!   Best, Sharlene Dory, NP

## 2023-03-22 ENCOUNTER — Telehealth: Payer: Self-pay | Admitting: Internal Medicine

## 2023-03-22 NOTE — Telephone Encounter (Signed)
 Called patient and advised per peck OV note she recommended this test due to Dilated aortic root Echo in 2021 revealed aortic root measuring 42 mm.  Was recommended to obtain CTA aorta at next office visit.  Will arrange his at this time.  Care and ED precautions discussed.  He verbalized understanding Please inform the patient. Patient agrees to still have testing done as scheduled

## 2023-03-22 NOTE — Telephone Encounter (Signed)
 Pt came into [place] office. He is asking if he still needs to have the CT done on 03/27/23. He is starting the process of getting clearance for shoulder surgery. The date has not been set.

## 2023-03-27 ENCOUNTER — Ambulatory Visit (HOSPITAL_COMMUNITY)
Admission: RE | Admit: 2023-03-27 | Discharge: 2023-03-27 | Disposition: A | Discharge status: Home / Self Care | Payer: Medicare Other | Source: Ambulatory Visit | Attending: Nurse Practitioner | Admitting: Nurse Practitioner

## 2023-03-27 DIAGNOSIS — I77810 Thoracic aortic ectasia: Secondary | ICD-10-CM | POA: Diagnosis present

## 2023-03-27 MED ORDER — IOHEXOL 350 MG/ML SOLN
100.0000 mL | Freq: Once | INTRAVENOUS | Status: AC | PRN
  Administered 2023-03-27: 100 mL via INTRAVENOUS

## 2023-03-29 ENCOUNTER — Ambulatory Visit: Payer: Medicare Other | Admitting: Nurse Practitioner

## 2023-04-02 ENCOUNTER — Other Ambulatory Visit: Payer: Self-pay

## 2023-04-02 ENCOUNTER — Emergency Department (HOSPITAL_COMMUNITY)

## 2023-04-02 ENCOUNTER — Inpatient Hospital Stay (HOSPITAL_COMMUNITY)
Admission: EM | Admit: 2023-04-02 | Discharge: 2023-04-05 | DRG: 291 | Disposition: A | Discharge status: Home / Self Care | Attending: Family Medicine | Admitting: Family Medicine

## 2023-04-02 ENCOUNTER — Encounter (HOSPITAL_COMMUNITY): Payer: Self-pay

## 2023-04-02 DIAGNOSIS — N179 Acute kidney failure, unspecified: Secondary | ICD-10-CM | POA: Diagnosis not present

## 2023-04-02 DIAGNOSIS — M7989 Other specified soft tissue disorders: Secondary | ICD-10-CM | POA: Diagnosis not present

## 2023-04-02 DIAGNOSIS — I4891 Unspecified atrial fibrillation: Secondary | ICD-10-CM

## 2023-04-02 DIAGNOSIS — Z880 Allergy status to penicillin: Secondary | ICD-10-CM | POA: Diagnosis not present

## 2023-04-02 DIAGNOSIS — R7989 Other specified abnormal findings of blood chemistry: Secondary | ICD-10-CM | POA: Diagnosis not present

## 2023-04-02 DIAGNOSIS — G4733 Obstructive sleep apnea (adult) (pediatric): Secondary | ICD-10-CM | POA: Diagnosis present

## 2023-04-02 DIAGNOSIS — D6859 Other primary thrombophilia: Secondary | ICD-10-CM | POA: Diagnosis present

## 2023-04-02 DIAGNOSIS — D649 Anemia, unspecified: Secondary | ICD-10-CM | POA: Diagnosis present

## 2023-04-02 DIAGNOSIS — Z8249 Family history of ischemic heart disease and other diseases of the circulatory system: Secondary | ICD-10-CM | POA: Diagnosis not present

## 2023-04-02 DIAGNOSIS — Z7901 Long term (current) use of anticoagulants: Secondary | ICD-10-CM

## 2023-04-02 DIAGNOSIS — I5031 Acute diastolic (congestive) heart failure: Secondary | ICD-10-CM | POA: Diagnosis not present

## 2023-04-02 DIAGNOSIS — M109 Gout, unspecified: Secondary | ICD-10-CM | POA: Diagnosis present

## 2023-04-02 DIAGNOSIS — Z888 Allergy status to other drugs, medicaments and biological substances status: Secondary | ICD-10-CM

## 2023-04-02 DIAGNOSIS — Z7984 Long term (current) use of oral hypoglycemic drugs: Secondary | ICD-10-CM | POA: Diagnosis not present

## 2023-04-02 DIAGNOSIS — E785 Hyperlipidemia, unspecified: Secondary | ICD-10-CM | POA: Diagnosis present

## 2023-04-02 DIAGNOSIS — I509 Heart failure, unspecified: Principal | ICD-10-CM

## 2023-04-02 DIAGNOSIS — Z1152 Encounter for screening for COVID-19: Secondary | ICD-10-CM | POA: Diagnosis not present

## 2023-04-02 DIAGNOSIS — I10 Essential (primary) hypertension: Secondary | ICD-10-CM | POA: Diagnosis not present

## 2023-04-02 DIAGNOSIS — Z96641 Presence of right artificial hip joint: Secondary | ICD-10-CM | POA: Diagnosis present

## 2023-04-02 DIAGNOSIS — I4819 Other persistent atrial fibrillation: Secondary | ICD-10-CM | POA: Diagnosis present

## 2023-04-02 DIAGNOSIS — T502X5A Adverse effect of carbonic-anhydrase inhibitors, benzothiadiazides and other diuretics, initial encounter: Secondary | ICD-10-CM | POA: Diagnosis not present

## 2023-04-02 DIAGNOSIS — I77819 Aortic ectasia, unspecified site: Secondary | ICD-10-CM | POA: Diagnosis present

## 2023-04-02 DIAGNOSIS — E1159 Type 2 diabetes mellitus with other circulatory complications: Secondary | ICD-10-CM | POA: Diagnosis not present

## 2023-04-02 DIAGNOSIS — Z96652 Presence of left artificial knee joint: Secondary | ICD-10-CM | POA: Diagnosis present

## 2023-04-02 DIAGNOSIS — I110 Hypertensive heart disease with heart failure: Secondary | ICD-10-CM | POA: Diagnosis present

## 2023-04-02 DIAGNOSIS — Z79899 Other long term (current) drug therapy: Secondary | ICD-10-CM | POA: Diagnosis not present

## 2023-04-02 DIAGNOSIS — D6869 Other thrombophilia: Secondary | ICD-10-CM | POA: Diagnosis not present

## 2023-04-02 DIAGNOSIS — E119 Type 2 diabetes mellitus without complications: Secondary | ICD-10-CM | POA: Diagnosis present

## 2023-04-02 DIAGNOSIS — E7849 Other hyperlipidemia: Secondary | ICD-10-CM | POA: Diagnosis not present

## 2023-04-02 LAB — CBC WITH DIFFERENTIAL/PLATELET
Abs Immature Granulocytes: 0.02 10*3/uL (ref 0.00–0.07)
Basophils Absolute: 0.1 10*3/uL (ref 0.0–0.1)
Basophils Relative: 1 %
Eosinophils Absolute: 0.4 10*3/uL (ref 0.0–0.5)
Eosinophils Relative: 5 %
HCT: 34.1 % — ABNORMAL LOW (ref 39.0–52.0)
Hemoglobin: 10.8 g/dL — ABNORMAL LOW (ref 13.0–17.0)
Immature Granulocytes: 0 %
Lymphocytes Relative: 12 %
Lymphs Abs: 0.9 10*3/uL (ref 0.7–4.0)
MCH: 30.2 pg (ref 26.0–34.0)
MCHC: 31.7 g/dL (ref 30.0–36.0)
MCV: 95.3 fL (ref 80.0–100.0)
Monocytes Absolute: 0.4 10*3/uL (ref 0.1–1.0)
Monocytes Relative: 6 %
Neutro Abs: 6 10*3/uL (ref 1.7–7.7)
Neutrophils Relative %: 76 %
Platelets: 217 10*3/uL (ref 150–400)
RBC: 3.58 MIL/uL — ABNORMAL LOW (ref 4.22–5.81)
RDW: 15.4 % (ref 11.5–15.5)
WBC: 7.8 10*3/uL (ref 4.0–10.5)
nRBC: 0 % (ref 0.0–0.2)

## 2023-04-02 LAB — COMPREHENSIVE METABOLIC PANEL
ALT: 21 U/L (ref 0–44)
AST: 22 U/L (ref 15–41)
Albumin: 3.5 g/dL (ref 3.5–5.0)
Alkaline Phosphatase: 60 U/L (ref 38–126)
Anion gap: 9 (ref 5–15)
BUN: 19 mg/dL (ref 8–23)
CO2: 27 mmol/L (ref 22–32)
Calcium: 8.9 mg/dL (ref 8.9–10.3)
Chloride: 102 mmol/L (ref 98–111)
Creatinine, Ser: 1.19 mg/dL (ref 0.61–1.24)
GFR, Estimated: >60 mL/min (ref >60)
Glucose, Bld: 95 mg/dL (ref 70–99)
Potassium: 3.9 mmol/L (ref 3.5–5.1)
Sodium: 138 mmol/L (ref 135–145)
Total Bilirubin: 1 mg/dL (ref 0.0–1.2)
Total Protein: 6.7 g/dL (ref 6.5–8.1)

## 2023-04-02 LAB — FOLATE: Folate: 15.8 ng/mL (ref >5.9)

## 2023-04-02 LAB — URINALYSIS, ROUTINE W REFLEX MICROSCOPIC
Bilirubin Urine: NEGATIVE
Glucose, UA: NEGATIVE mg/dL
Hgb urine dipstick: NEGATIVE
Ketones, ur: NEGATIVE mg/dL
Leukocytes,Ua: NEGATIVE
Nitrite: NEGATIVE
Protein, ur: NEGATIVE mg/dL
Specific Gravity, Urine: 1.013 (ref 1.005–1.030)
pH: 7 (ref 5.0–8.0)

## 2023-04-02 LAB — PROTIME-INR
INR: 2.5 — ABNORMAL HIGH (ref 0.8–1.2)
Prothrombin Time: 26.8 s — ABNORMAL HIGH (ref 11.4–15.2)

## 2023-04-02 LAB — IRON AND TIBC
Iron: 49 ug/dL (ref 45–182)
Saturation Ratios: 12 % — ABNORMAL LOW (ref 17.9–39.5)
TIBC: 415 ug/dL (ref 250–450)
UIBC: 366 ug/dL

## 2023-04-02 LAB — GLUCOSE, CAPILLARY
Glucose-Capillary: 83 mg/dL (ref 70–99)
Glucose-Capillary: 127 mg/dL — ABNORMAL HIGH (ref 70–99)

## 2023-04-02 LAB — RESP PANEL BY RT-PCR (RSV, FLU A&B, COVID)  RVPGX2
Influenza A by PCR: NEGATIVE
Influenza B by PCR: NEGATIVE
Resp Syncytial Virus by PCR: NEGATIVE
SARS Coronavirus 2 by RT PCR: NEGATIVE

## 2023-04-02 LAB — HEMOGLOBIN A1C
Hgb A1c MFr Bld: 5.7 % — ABNORMAL HIGH (ref 4.8–5.6)
Mean Plasma Glucose: 116.89 mg/dL

## 2023-04-02 LAB — TROPONIN I (HIGH SENSITIVITY): Troponin I (High Sensitivity): 16 ng/L (ref <18)

## 2023-04-02 LAB — RETICULOCYTES
Immature Retic Fract: 26.2 % — ABNORMAL HIGH (ref 2.3–15.9)
RBC.: 3.6 MIL/uL — ABNORMAL LOW (ref 4.22–5.81)
Retic Count, Absolute: 98.3 10*3/uL (ref 19.0–186.0)
Retic Ct Pct: 2.7 % (ref 0.4–3.1)

## 2023-04-02 LAB — VITAMIN B12: Vitamin B-12: 546 pg/mL (ref 180–914)

## 2023-04-02 LAB — MAGNESIUM: Magnesium: 1.8 mg/dL (ref 1.7–2.4)

## 2023-04-02 LAB — BRAIN NATRIURETIC PEPTIDE: B Natriuretic Peptide: 244 pg/mL — ABNORMAL HIGH (ref 0.0–100.0)

## 2023-04-02 LAB — FERRITIN: Ferritin: 42 ng/mL (ref 24–336)

## 2023-04-02 MED ORDER — INSULIN ASPART 100 UNIT/ML IJ SOLN
0.0000 [IU] | Freq: Three times a day (TID) | INTRAMUSCULAR | Status: DC
  Administered 2023-04-03 – 2023-04-05 (×4): 2 [IU] via SUBCUTANEOUS

## 2023-04-02 MED ORDER — PANTOPRAZOLE SODIUM 40 MG PO TBEC
40.0000 mg | DELAYED_RELEASE_TABLET | Freq: Every evening | ORAL | Status: DC
  Administered 2023-04-02 – 2023-04-04 (×3): 40 mg via ORAL
  Filled 2023-04-02 (×3): qty 1

## 2023-04-02 MED ORDER — COLCHICINE 0.6 MG PO TABS
0.6000 mg | ORAL_TABLET | Freq: Every day | ORAL | Status: DC | PRN
  Administered 2023-04-04: 0.6 mg via ORAL
  Filled 2023-04-02: qty 1

## 2023-04-02 MED ORDER — SODIUM CHLORIDE 0.9 % IV SOLN: 250.0000 mL | INTRAVENOUS | Status: AC | PRN

## 2023-04-02 MED ORDER — TRAZODONE HCL 50 MG PO TABS: 50.0000 mg | ORAL_TABLET | Freq: Every evening | ORAL | Status: DC | PRN

## 2023-04-02 MED ORDER — FUROSEMIDE 10 MG/ML IJ SOLN
20.0000 mg | Freq: Three times a day (TID) | INTRAMUSCULAR | Status: DC
  Administered 2023-04-02 – 2023-04-03 (×3): 20 mg via INTRAVENOUS
  Filled 2023-04-02 (×3): qty 2

## 2023-04-02 MED ORDER — FENTANYL CITRATE PF 50 MCG/ML IJ SOSY: 12.5000 ug | PREFILLED_SYRINGE | INTRAMUSCULAR | Status: DC | PRN

## 2023-04-02 MED ORDER — ACETAMINOPHEN 325 MG PO TABS: 650.0000 mg | ORAL_TABLET | ORAL | Status: DC | PRN

## 2023-04-02 MED ORDER — MAGNESIUM SULFATE 2 GM/50ML IV SOLN
2.0000 g | Freq: Once | INTRAVENOUS | Status: AC
  Administered 2023-04-02: 2 g via INTRAVENOUS
  Filled 2023-04-02: qty 50

## 2023-04-02 MED ORDER — POTASSIUM CHLORIDE CRYS ER 20 MEQ PO TBCR
30.0000 meq | EXTENDED_RELEASE_TABLET | Freq: Two times a day (BID) | ORAL | Status: DC
  Administered 2023-04-02 – 2023-04-05 (×6): 30 meq via ORAL
  Filled 2023-04-02 (×6): qty 1

## 2023-04-02 MED ORDER — ONDANSETRON HCL 4 MG/2ML IJ SOLN: 4.0000 mg | Freq: Four times a day (QID) | INTRAMUSCULAR | Status: DC | PRN

## 2023-04-02 MED ORDER — FUROSEMIDE 10 MG/ML IJ SOLN: 20.0000 mg | Freq: Two times a day (BID) | INTRAMUSCULAR | Status: DC

## 2023-04-02 MED ORDER — ATORVASTATIN CALCIUM 40 MG PO TABS
40.0000 mg | ORAL_TABLET | Freq: Every day | ORAL | Status: DC
  Administered 2023-04-02 – 2023-04-04 (×3): 40 mg via ORAL
  Filled 2023-04-02 (×3): qty 1

## 2023-04-02 MED ORDER — OXYCODONE HCL 5 MG PO TABS
5.0000 mg | ORAL_TABLET | Freq: Four times a day (QID) | ORAL | Status: DC | PRN
  Administered 2023-04-02 – 2023-04-04 (×7): 5 mg via ORAL
  Filled 2023-04-02 (×7): qty 1

## 2023-04-02 MED ORDER — ALBUTEROL SULFATE HFA 108 (90 BASE) MCG/ACT IN AERS
2.0000 | INHALATION_SPRAY | RESPIRATORY_TRACT | Status: DC | PRN
  Administered 2023-04-02: 2 via RESPIRATORY_TRACT
  Filled 2023-04-02: qty 6.7

## 2023-04-02 MED ORDER — WARFARIN SODIUM 7.5 MG PO TABS
7.5000 mg | ORAL_TABLET | Freq: Once | ORAL | Status: AC
  Administered 2023-04-02: 7.5 mg via ORAL
  Filled 2023-04-02: qty 1

## 2023-04-02 MED ORDER — LOSARTAN POTASSIUM 50 MG PO TABS
100.0000 mg | ORAL_TABLET | Freq: Every day | ORAL | Status: DC
  Administered 2023-04-03 – 2023-04-05 (×3): 100 mg via ORAL
  Filled 2023-04-02 (×3): qty 2

## 2023-04-02 MED ORDER — FUROSEMIDE 10 MG/ML IJ SOLN
20.0000 mg | Freq: Once | INTRAMUSCULAR | Status: AC
  Administered 2023-04-02: 20 mg via INTRAVENOUS
  Filled 2023-04-02: qty 2

## 2023-04-02 MED ORDER — POTASSIUM CHLORIDE CRYS ER 20 MEQ PO TBCR
30.0000 meq | EXTENDED_RELEASE_TABLET | Freq: Every evening | ORAL | Status: DC
  Administered 2023-04-02: 30 meq via ORAL
  Filled 2023-04-02: qty 1

## 2023-04-02 MED ORDER — WARFARIN - PHARMACIST DOSING INPATIENT: Freq: Every day | Status: DC

## 2023-04-02 MED ORDER — SODIUM CHLORIDE 0.9% FLUSH: 3.0000 mL | INTRAVENOUS | Status: DC | PRN

## 2023-04-02 MED ORDER — METOPROLOL TARTRATE 50 MG PO TABS
75.0000 mg | ORAL_TABLET | Freq: Two times a day (BID) | ORAL | Status: DC
  Administered 2023-04-02 – 2023-04-05 (×5): 75 mg via ORAL
  Filled 2023-04-02 (×6): qty 2

## 2023-04-02 MED ORDER — SODIUM CHLORIDE 0.9% FLUSH
3.0000 mL | Freq: Two times a day (BID) | INTRAVENOUS | Status: DC
  Administered 2023-04-02 – 2023-04-04 (×5): 3 mL via INTRAVENOUS

## 2023-04-02 NOTE — Progress Notes (Signed)
 PHARMACY - ANTICOAGULATION CONSULT NOTE  Pharmacy Consult for warfarin Indication: atrial fibrillation  Allergies  Allergen Reactions   Other Swelling    Pt can't eat anything from [facility], it causes swelling   Penicillins Hives and Itching    Did it involve swelling of the face/tongue/throat, SOB, or low BP? No Did it involve sudden or severe rash/hives, skin peeling, or any reaction on the inside of your mouth or nose? No Did you need to seek medical attention at a hospital or doctor's office? No When did it last happen?       If all above answers are "NO", may proceed with cephalosporin use. Tolerated Cephalosporin Date: 07/27/21.     Cefazolin Rash    Patient Measurements: Height: 6' (182.9 cm) Weight: 127 kg (279 lb 15.8 oz) IBW/kg (Calculated) : 77.6  Vital Signs: Temp: 97.7 F (36.5 C) (03/04 1115) Temp Source: Oral (03/04 1115) BP: 132/62 (03/04 1345) Pulse Rate: 55 (03/04 1345)  Labs: Recent Labs    04/02/23 1235  HGB 10.8*  HCT 34.1*  PLT 217  LABPROT 26.8*  INR 2.5*  CREATININE 1.19  TROPONINIHS 16    Estimated Creatinine Clearance: 75 mL/min (by C-G formula based on SCr of 1.19 mg/dL).   Medical History: Past Medical History:  Diagnosis Date   Atrial fibrillation ([facility])    Diagnosed 2020   Diverticula of intestine    DJD (degenerative joint disease)    Dysrhythmia    Essential hypertension    Gout    OSA (obstructive sleep apnea)    Intolerant of CPAP   Osteoarthritis    Polio    Type 2 diabetes mellitus ([facility])     Medications:  See med rec  Assessment: Pt arrived from home c/o worsening SOB X 4 days. Pt reports bilateral leg swelling. Pt reports difficulty breathing worse at night.  Patient is chronically anticoagulated with warfarin for afib. Pharmacy asked to dose.   Seen in anticoag clinic. Home dose is 9 mg on Monday and 7.5mg  all other days   Goal of Therapy:  INR 2-3 Monitor platelets by anticoagulation protocol: Yes    Plan:  Coumadin 7.5mg  po x 1 today PT-INR daily Monitor for S/S of bleeding  Elder Cyphers, BS Pharm D, BCPS Clinical Pharmacist 04/02/2023,2:23 PM

## 2023-04-02 NOTE — H&P (Addendum)
 History and Physical  [address]  [name] [place] ZOX:[id number] DOB: [birth year] DOA: 04/02/2023  PCP: Catalina Lunger, DO  Patient coming from: Home  Level of care: Telemetry  I have personally briefly reviewed patient's old medical records in [facility]  Chief Complaint: dyspnea  HPI: Miguel Cox is a 72 year old male with hypertension, hyperlipidemia diastolic heart failure, type 2 diabetes mellitus, atrial fibrillation on warfarin daily, dilatation of the aorta presented to the emergency department complaining of worsening shortness of breath over the last 4 days associated with increasing edema in both legs and difficulty breathing especially at night.  He reports he is not able to lie recumbent without severe shortness of breath.  He denies having chest pain.  He denies palpitations.  He denies fever and chills.  He was evaluated in the ED and noted to have an elevated BNP and evidence of pulmonary edema on chest x-ray.  His blood pressures were elevated on admission.  He had a controlled rate atrial fibrillation on EKG.  His INR was therapeutic at 2.5.  His clinical exam was consistent with heart failure presentation.  His respiratory panel testing was negative for influenza RSV and coronavirus.  He was started on IV furosemide treatment and admission was requested for further management.  Past Medical History:  Diagnosis Date   Atrial fibrillation (HCC)    Diagnosed 2020   Diverticula of intestine    DJD (degenerative joint disease)    Dysrhythmia    Essential hypertension    Gout    OSA (obstructive sleep apnea)    Intolerant of CPAP   Osteoarthritis    Polio    Type 2 diabetes mellitus (HCC)     Past Surgical History:  Procedure Laterality Date   removal of toe nail     age 28 for fungus   SHOULDER ARTHROSCOPY Right 2011   right   TOTAL HIP ARTHROPLASTY Right 07/27/2021   Procedure: TOTAL HIP ARTHROPLASTY ANTERIOR APPROACH;  Surgeon: Durene Romans, MD;   Location: [facility];  Service: Orthopedics;  Laterality: Right;   TOTAL KNEE ARTHROPLASTY Left 01/29/2009   Dr. Shelle Iron     reports that he has never smoked. He has never used smokeless tobacco. He reports that he does not currently use alcohol. He reports that he does not use drugs.  Allergies  Allergen Reactions   Other Swelling    Pt can't eat anything from [facility], it causes swelling   Penicillins Hives and Itching    Did it involve swelling of the face/tongue/throat, SOB, or low BP? No Did it involve sudden or severe rash/hives, skin peeling, or any reaction on the inside of your mouth or nose? No Did you need to seek medical attention at a hospital or doctor's office? No When did it last happen?       If all above answers are "NO", may proceed with cephalosporin use. Tolerated Cephalosporin Date: 07/27/21.     Cefazolin Rash    Family History  Problem Relation Age of Onset   Cancer Mother    Heart attack Brother 71    Prior to Admission medications   Medication Sig Start Date End Date Taking? Authorizing Provider  amLODipine (NORVASC) 10 MG tablet Take by mouth. 03/07/23  Yes [provider]  atorvastatin (LIPITOR) 40 MG tablet Take 40 mg by mouth at bedtime. 03/07/14  Yes [provider]  Cholecalciferol (VITAMIN D3 PO) Take 1 tablet by mouth daily.   Yes [provider]  colchicine 0.6 MG tablet Take 0.6 mg by mouth daily as needed (gout). Take 2 tablets and one hour later take a third tablet as needed for gout   Yes [provider]  EPINEPHrine (EPIPEN 2-PAK) 0.3 mg/0.3 mL IJ SOAJ injection Inject 0.3 mLs (0.3 mg total) into the muscle once. 04/26/14  Yes Samuel Jester, DO  glipiZIDE (GLUCOTROL XL) 10 MG 24 hr tablet Take 10 mg by mouth daily. 09/30/18  Yes [provider]  losartan (COZAAR) 50 MG tablet TAKE 1 TABLET BY MOUTH EVERY DAY Patient taking differently: Take 100 mg by mouth daily. 03/29/22  Yes Mallipeddi, Vishnu P, MD   metFORMIN (GLUCOPHAGE-XR) 500 MG 24 hr tablet Take 500 mg by mouth 2 (two) times daily. 09/29/18  Yes [provider]  Metoprolol Tartrate 75 MG TABS Take 1 tablet by mouth 2 (two) times daily. 01/09/23  Yes [provider]  Multiple Vitamin (MULTIVITAMIN WITH MINERALS) TABS tablet Take 1 tablet by mouth daily.   Yes [provider]  sildenafil (VIAGRA) 100 MG tablet Take 1 tablet by mouth daily as needed. 01/30/22  Yes [provider]  warfarin (COUMADIN) 3 MG tablet TAKE 2 AND 1/2 TABLETS DAILY EXCEPT 3 TABLETS ON MONDAYS OR AS DIRECTED BY COUMADIN CLINIC 10/30/22  Yes Mallipeddi, Vishnu P, MD  Krill Oil 1000 MG CAPS Take 1,000 mg by mouth daily.     [provider]    Physical Exam: Vitals:   04/02/23 1300 04/02/23 1315 04/02/23 1330 04/02/23 1345  BP: (!) 143/64 (!) 157/62 (!) 157/69 132/62  Pulse: 69 63 60 (!) 55  Resp: 15 (!) 22 20 15   Temp:      TempSrc:      SpO2: 92% 93% 92% 91%  Weight:      Height:        Constitutional: sitting up in bed, unable to lie flat without severe SOB; moderately distressed.  Eyes: PERRL, lids and conjunctivae normal ENMT: Mucous membranes are moist. Posterior pharynx clear of any exudate or lesions.Normal dentition.  Neck: normal, supple, no masses, no thyromegaly Respiratory: bibasilar crackles heard.   Cardiovascular: normal s1, s2 sounds, no murmurs / rubs / gallops. 3+ pitting lower extremity edema. 2+ pedal pulses. No carotid bruits.  Abdomen: no tenderness, no masses palpated. No hepatosplenomegaly. Bowel sounds positive.  Musculoskeletal: no clubbing / cyanosis. No joint deformity upper and lower extremities. Good ROM, no contractures. Normal muscle tone.  Skin: no rashes, lesions, ulcers. No induration Neurologic: CN 2-12 grossly intact. Sensation intact, DTR normal. Strength 5/5 in all 4.  Psychiatric: Normal judgment and insight. Alert and oriented x 3. Normal mood.   Labs on Admission: I have  personally reviewed following labs and imaging studies  CBC: Recent Labs  Lab 04/02/23 1235  WBC 7.8  NEUTROABS 6.0  HGB 10.8*  HCT 34.1*  MCV 95.3  PLT 217   Basic Metabolic Panel: Recent Labs  Lab 04/02/23 1235  NA 138  K 3.9  CL 102  CO2 27  GLUCOSE 95  BUN 19  CREATININE 1.19  CALCIUM 8.9  MG 1.8   GFR: Estimated Creatinine Clearance: 75 mL/min (by C-G formula based on SCr of 1.19 mg/dL). Liver Function Tests: Recent Labs  Lab 04/02/23 1235  AST 22  ALT 21  ALKPHOS 60  BILITOT 1.0  PROT 6.7  ALBUMIN 3.5   No results for input(s): "LIPASE", "AMYLASE" in the last 168 hours. No results for input(s): "AMMONIA" in the last  168 hours. Coagulation Profile: Recent Labs  Lab 04/02/23 1235  INR 2.5*   Cardiac Enzymes: No results for input(s): "CKTOTAL", "CKMB", "CKMBINDEX", "TROPONINI" in the last 168 hours. BNP (last 3 results) No results for input(s): "PROBNP" in the last 8760 hours. HbA1C: No results for input(s): "HGBA1C" in the last 72 hours. CBG: No results for input(s): "GLUCAP" in the last 168 hours. Lipid Profile: No results for input(s): "CHOL", "HDL", "LDLCALC", "TRIG", "CHOLHDL", "LDLDIRECT" in the last 72 hours. Thyroid Function Tests: No results for input(s): "TSH", "T4TOTAL", "FREET4", "T3FREE", "THYROIDAB" in the last 72 hours. Anemia Panel: No results for input(s): "VITAMINB12", "FOLATE", "FERRITIN", "TIBC", "IRON", "RETICCTPCT" in the last 72 hours. Urine analysis:    Component Value Date/Time   COLORURINE YELLOW 04/02/2023 1110   APPEARANCEUR CLEAR 04/02/2023 1110   LABSPEC 1.013 04/02/2023 1110   PHURINE 7.0 04/02/2023 1110   GLUCOSEU NEGATIVE 04/02/2023 1110   HGBUR NEGATIVE 04/02/2023 1110   BILIRUBINUR NEGATIVE 04/02/2023 1110   KETONESUR NEGATIVE 04/02/2023 1110   PROTEINUR NEGATIVE 04/02/2023 1110   UROBILINOGEN 1.0 02/16/2010 1530   NITRITE NEGATIVE 04/02/2023 1110   LEUKOCYTESUR NEGATIVE 04/02/2023 1110     Radiological Exams on Admission: DG Chest Port 1 View Result Date: 04/02/2023 CLINICAL DATA:  Shortness of breath. EXAM: PORTABLE CHEST 1 VIEW COMPARISON:  December 01, 2018. FINDINGS: Stable cardiomediastinal silhouette. Mild central pulmonary vascular congestion is noted and minimal pulmonary edema cannot be excluded. Bony thorax is unremarkable. IMPRESSION: Mild central pulmonary vascular congestion is noted with possible minimal pulmonary edema. Electronically Signed   By: Lupita Raider M.D.   On: 04/02/2023 13:20    EKG: Independently reviewed. Controlled rate atrial fibrillation   Assessment/Plan Principal Problem:   Acute heart failure (HCC) Active Problems:   Acquired thrombophilia (HCC)   Persistent atrial fibrillation (HCC)   HTN, goal below 140/90   HLD (hyperlipidemia)   OSA (obstructive sleep apnea)   Leg swelling   Dilatation of aorta (HCC)   Normocytic anemia   Elevated brain natriuretic peptide (BNP) level   Type 2 diabetes mellitus   Acute HFpEF - last echo from 2021 with preserved EF - repeat echo ordered and pending - continue diuresis with IV furosemide 20 mg every 8 hours - continue potassium supplement - sodium restricted diet  - strict intake and outpatient ordered - monitor daily weight - monitor ReDs vest - monitor daily electrolytes and renal function   Severe bilateral LE edema  - presumably secondary to heart failure  - he is fully anticoagulated on warfarin with low likelihood of DVT - elevate legs  - TED hoses if possible  - IV furosemide - apparently he is still taking amlodipine when we see in outpatient notes he was supposed to be taken off this, will DC amlodipine for now with no plans to restart this given his severe leg edema   Acquired Thrombophilia - he is therapeutic on daily warfarin  - warfarin management by Ilda Basset D with MD oversight while inpatient  - daily PT/INR ordered  Chronic atrial fibrillation  - resume home  metoprolol for heart rate control  - continue warfarin for full anticoagulation  - follow up echocardiogram  Normocytic anemia  - add anemia panel to labs - follow daily CBC   Type 2 diabetes mellitus  - follow up A1c  - SSI coverage ordered - monitor CBG closely   Primary (essential) hypertension  - resume home BP medication  - IV medication ordered PRN   OSA -  nightly CPAP ordered   Dilatation of aorta  - this is being monitored by cardiology office outpatient with planned CT Angio  DVT prophylaxis: warfarin  Code Status: Full   Family Communication: wife at bedside   Disposition Plan: anticipate home   Consults called: pharm D   Admission status: INP  Time spent: 65 mins   Level of care: Telemetry Standley Dakins MD [facility] How to contact the [facility] Attending or Consulting provider 7A - 7P or covering provider during after hours 7P -7A, for this patient?  Check the care team in [facility] and look for a) attending/consulting [facility] provider listed and b) the [facility] team listed Log into [email or web address] and use [facility]'s universal password to access. If you do not have the password, please contact the hospital operator. Locate the [facility] provider you are looking for under [facility] and page to a number that you can be directly reached. If you still have difficulty reaching the provider, please page the [facility] (Director on Call) for the Hospitalists listed on amion for assistance.   If 7PM-7AM, please contact night-coverage [email or web address] Password [email or web address]  04/02/2023, 2:10 PM

## 2023-04-02 NOTE — Hospital Course (Addendum)
 Mr. Miguel Cox is a 73 year old male with hypertension, hyperlipidemia, diastolic heart failure, type 2 diabetes, atrial fibrillation on warfarin, chronic dilation of aorta, who presented to the ED complaining of worsening dyspnea, lower extremity edema, and orthopnea.  In the ED he was found to have BNP elevation to 244, and profound lower extremity edema.  Pulmonary edema was also seen on chest x-ray and patient demonstrated elevated blood pressures.  He was admitted for heart failure exacerbation.   He had significant improvement with IV diuresis and put out over 6 L during the course of his admission.  Repeat echocardiogram revealed LVEF 55%, left ventricle with mild dilation and diastolic parameters were indeterminate.  Right ventricular function is moderately reduced and ventricular size is enlarged.  Patient was educated on the importance of a low-sodium diet and GDMT was titrated as blood pressure could tolerate.  Pharmacy was consulted to evaluate for Farxiga patient but this is cost prohibitive up to the patient.  By reevaluation on 3/7 patient's edema had improved significantly and he endorsed feeling back to his physiologic baseline and ready to discharge home.   His stay was complicated by AKI as a result of his diuresis.  We discussed continued hospitalization to monitor his kidney function but patient has requested to discharge home with outpatient follow-up.  After shared decision making, we will proceed with discharge today and he will follow-up with his primary care physician early next week to check kidney function.  He was given strict ER return precautions. While admitted we performed hemoglobin A1c which revealed 5.7%.  Patient is taking glipizide and metformin outpatient.  While he may benefit from continued metformin, we will hold this medication currently given his kidney function.  He may no longer require glipizide.  We have requested he follow-up with his primary care physician for  continued diabetes management.   Acute heart failure preserved EF - BNP 244 on arrival with severe lower extremity edema - Echocardiogram LVEF 55%, left ventricle internal cavity mildly dilated.  Diastolic parameters are indeterminate.  Right ventricular function is moderately reduced.  Right ventricular size mildly enlarged. - Adequately diuresed now, output 6 L throughout admission.  Hold further Lasix given kidney function - GDMT as BP tolerates (Farxiga will be cost prohibitive) - Optimize electrolytes   Bilateral lower extremity edema - Secondary to heart failure as above - Additionally was taking amlodipine outpatient, will discontinue this in favor of alternative antihypertensives to avoid further complicating lower extremity edema picture   AKI - Baseline creatinine ~1.0, up to 1.59. -- Acute worsening likely 2/2 to diuresis. Hold further lasix. -- renally dose with CrCl 55 when needed -- discussed continued admission for renal function improvement, pt has requested to DC home and will follow with PCP.    On anticoagulation - Daily warfarin - INR therapeutic on arrival - Continue home dose at DC    Chronic A-fib - Continue home dose metoprolol and warfarin as above - Echocardiogram as above   Normocytic anemia - Anemia panel: low TSat, Iron WNL. - Trend CBC   Type 2 diabetes - Hemoglobin A1c: 5.7%.  Continue with just daily glucose checks for now.  No indication for sliding scale insulin at this time. -- hold glipizide at DC. F/u PCP   Hypertension - Hold amlodipine as above. BP currently at goal. - Cont losartan   OSA - CPAP at night   Dilation of aorta - Patient is planned for CT angio outpatient, being monitored by cardiology.  Continue  close outpatient follow-up

## 2023-04-02 NOTE — ED Triage Notes (Signed)
 Pt arrived via POV from home c/o worsening SOB X 4 days. Pt reports bilateral leg swelling. Pt reports difficulty breathing worse at night.

## 2023-04-02 NOTE — ED Notes (Signed)
 Nurse coming to transport pt to floor.

## 2023-04-02 NOTE — Telephone Encounter (Signed)
 Patient with diagnosis of afib on warfarin for anticoagulation.    Procedure:  LEFT REVERSE TOTAL SHOULDER ARTHROPLASTY  Date of procedure: TBD   CHA2DS2-VASc Score = 3   This indicates a 3.2% annual risk of stroke. The patient's score is based upon: CHF History: 0 HTN History: 1 Diabetes History: 1 Stroke History: 0 Vascular Disease History: 0 Age Score: 1 Gender Score: 0       Per office protocol, patient can hold warfarin for 5 days prior to procedure.    Patient will NOT need bridging with Lovenox (enoxaparin) around procedure.  **This guidance is not considered finalized until pre-operative APP has relayed final recommendations.**

## 2023-04-02 NOTE — Progress Notes (Signed)
 RT Note: Spoke with pt abut CPAP order. Pt states he has never worn CPAP or had a sleep study. Pt tolerating well on 2L [place] at this time.

## 2023-04-02 NOTE — ED Notes (Signed)
 Called for Pt from waiting room. Pt asked to use restroom first.

## 2023-04-02 NOTE — ED Provider Notes (Signed)
 [place] EMERGENCY DEPARTMENT AT [facility] Provider Note   CSN: [id number] Arrival date & time: 04/02/23  1105     History  Chief Complaint  Patient presents with   Shortness of Breath    Miguel Cox is a 73 y.o. male.  He is here with a plaint of shortness of breath dyspnea on exertion and orthopnea for a week and leg swelling for a few days.  He tells me he just was started on a diuretic.  Minimally helping.  Nonproductive cough.  Having trouble sleeping flat at night.  No fevers chest pain abdominal pain vomiting diarrhea.  Sometimes gets some cramps in his thighs bilaterally.  He is on warfarin for atrial fibrillation  The history is provided by the patient.  Shortness of Breath Severity:  Moderate Onset quality:  Gradual Duration:  1 week Timing:  Constant Progression:  Worsening Chronicity:  New Relieved by:  Nothing Worsened by:  Activity Ineffective treatments:  Diuretics Associated symptoms: cough   Associated symptoms: no abdominal pain, no chest pain, no fever, no hemoptysis, no vomiting and no wheezing   Risk factors: no tobacco use        Home Medications Prior to Admission medications   Medication Sig Start Date End Date Taking? Authorizing Provider  atorvastatin (LIPITOR) 40 MG tablet Take 40 mg by mouth at bedtime. 03/07/14   [provider]  Cholecalciferol (VITAMIN D3 PO) Take 1 tablet by mouth daily.    [provider]  colchicine 0.6 MG tablet Take 0.6 mg by mouth daily as needed (gout).     [provider]  Cyanocobalamin (B-12 PO) Take 1 tablet by mouth daily.    [provider]  EPINEPHrine (EPIPEN 2-PAK) 0.3 mg/0.3 mL IJ SOAJ injection Inject 0.3 mLs (0.3 mg total) into the muscle once. 04/26/14   Samuel Jester, DO  glipiZIDE (GLUCOTROL XL) 10 MG 24 hr tablet Take 10 mg by mouth daily. 09/30/18   [provider]  HYDROcodone-acetaminophen (NORCO/VICODIN) 5-325 MG tablet Take 1 tablet by  mouth every 4 (four) hours as needed for severe pain. 07/28/21   Cassandria Anger, PA-C  Krill Oil 1000 MG CAPS Take 1,000 mg by mouth daily.     [provider]  losartan (COZAAR) 50 MG tablet TAKE 1 TABLET BY MOUTH EVERY DAY 03/29/22   Mallipeddi, Vishnu P, MD  metFORMIN (GLUCOPHAGE-XR) 500 MG 24 hr tablet Take 1,000 mg by mouth 2 (two) times daily. 09/29/18   [provider]  methocarbamol (ROBAXIN) 500 MG tablet Take 1 tablet (500 mg total) by mouth every 6 (six) hours as needed for muscle spasms (muscle pain). 07/28/21   Cassandria Anger, PA-C  metoprolol tartrate (LOPRESSOR) 100 MG tablet Take 100 mg by mouth 2 (two) times daily.    [provider]  Multiple Vitamin (MULTIVITAMIN WITH MINERALS) TABS tablet Take 1 tablet by mouth daily.    [provider]  polyethylene glycol (MIRALAX / GLYCOLAX) 17 g packet Take 17 g by mouth daily as needed for mild constipation. 07/28/21   Cassandria Anger, PA-C  sildenafil (VIAGRA) 100 MG tablet Take 1 tablet by mouth daily as needed. 01/30/22   [provider]  traMADol (ULTRAM) 50 MG tablet Take 50 mg by mouth daily as needed.    [provider]  warfarin (COUMADIN) 3 MG tablet TAKE 2 AND 1/2 TABLETS DAILY EXCEPT 3 TABLETS ON MONDAYS OR AS DIRECTED BY COUMADIN CLINIC 10/30/22   Mallipeddi,  Vishnu P, MD      Allergies    Other, Penicillins, and Cefazolin    Review of Systems   Review of Systems  Constitutional:  Negative for fever.  Respiratory:  Positive for cough and shortness of breath. Negative for hemoptysis and wheezing.   Cardiovascular:  Negative for chest pain.  Gastrointestinal:  Negative for abdominal pain and vomiting.    Physical Exam Updated Vital Signs BP (!) 173/74   Pulse 69   Temp 97.7 F (36.5 C) (Oral)   Resp 19   Ht 6' (1.829 m)   Wt 127 kg   SpO2 90%   BMI 37.97 kg/m  Physical Exam Vitals and nursing note reviewed.  Constitutional:      General: He is not in acute  distress.    Appearance: He is well-developed.  HENT:     Head: Normocephalic and atraumatic.  Eyes:     Conjunctiva/sclera: Conjunctivae normal.  Cardiovascular:     Rate and Rhythm: Normal rate. Rhythm irregular.     Heart sounds: No murmur heard. Pulmonary:     Effort: Pulmonary effort is normal. No respiratory distress.     Breath sounds: Normal breath sounds.  Abdominal:     Palpations: Abdomen is soft.     Tenderness: There is no abdominal tenderness.  Musculoskeletal:        General: No swelling.     Cervical back: Neck supple.     Right lower leg: Tenderness present. Edema present.     Left lower leg: Tenderness present. Edema present.  Skin:    General: Skin is warm and dry.     Capillary Refill: Capillary refill takes less than 2 seconds.  Neurological:     General: No focal deficit present.     Mental Status: He is alert.     ED Results / Procedures / Treatments   Labs (all labs ordered are listed, but only abnormal results are displayed) Labs Reviewed  BRAIN NATRIURETIC PEPTIDE - Abnormal; Notable for the following components:      Result Value   B Natriuretic Peptide 244.0 (*)    All other components within normal limits  CBC WITH DIFFERENTIAL/PLATELET - Abnormal; Notable for the following components:   RBC 3.58 (*)    Hemoglobin 10.8 (*)    HCT 34.1 (*)    All other components within normal limits  PROTIME-INR - Abnormal; Notable for the following components:   Prothrombin Time 26.8 (*)    INR 2.5 (*)    All other components within normal limits  HEMOGLOBIN A1C - Abnormal; Notable for the following components:   Hgb A1c MFr Bld 5.7 (*)    All other components within normal limits  IRON AND TIBC - Abnormal; Notable for the following components:   Saturation Ratios 12 (*)    All other components within normal limits  RETICULOCYTES - Abnormal; Notable for the following components:   RBC. 3.60 (*)    Immature Retic Fract 26.2 (*)    All other  components within normal limits  RESP PANEL BY RT-PCR (RSV, FLU A&B, COVID)  [id number]  COMPREHENSIVE METABOLIC PANEL  URINALYSIS, ROUTINE W REFLEX MICROSCOPIC  MAGNESIUM  VITAMIN B12  FOLATE  FERRITIN  GLUCOSE, CAPILLARY  BASIC METABOLIC PANEL  MAGNESIUM  CBC  PROTIME-INR  TROPONIN I (HIGH SENSITIVITY)    EKG EKG Interpretation Date/Time:  Tuesday April 02 2023 11:16:55 EST Ventricular Rate:  69 PR Interval:    QRS Duration:  108 QT Interval:  394 QTC Calculation: 422 R Axis:   104  Text Interpretation: Atrial fibrillation Rightward axis Abnormal ECG When compared with ECG of 22-Feb-2023 15:49, No significant change was found Confirmed by Meridee Score [phone]) on 04/02/2023 11:20:20 AM  Radiology DG Chest Port 1 View Result Date: 04/02/2023 CLINICAL DATA:  Shortness of breath. EXAM: PORTABLE CHEST 1 VIEW COMPARISON:  December 01, 2018. FINDINGS: Stable cardiomediastinal silhouette. Mild central pulmonary vascular congestion is noted and minimal pulmonary edema cannot be excluded. Bony thorax is unremarkable. IMPRESSION: Mild central pulmonary vascular congestion is noted with possible minimal pulmonary edema. Electronically Signed   By: Lupita Raider M.D.   On: 04/02/2023 13:20    Procedures Procedures    Medications Ordered in ED Medications  albuterol (VENTOLIN HFA) 108 (90 Base) MCG/ACT inhaler 2 puff (2 puffs Inhalation Given 04/02/23 1354)  sodium chloride flush (NS) 0.9 % injection 3 mL (has no administration in time range)  sodium chloride flush (NS) 0.9 % injection 3 mL (has no administration in time range)  0.9 %  sodium chloride infusion (has no administration in time range)  acetaminophen (TYLENOL) tablet 650 mg (has no administration in time range)  ondansetron (ZOFRAN) injection 4 mg (has no administration in time range)  insulin aspart (novoLOG) injection 0-15 Units (has no administration in time range)  oxyCODONE (Oxy IR/ROXICODONE) immediate release tablet 5  mg (5 mg Oral Given 04/02/23 1552)  fentaNYL (SUBLIMAZE) injection 12.5 mcg (has no administration in time range)  traZODone (DESYREL) tablet 50 mg (has no administration in time range)  pantoprazole (PROTONIX) EC tablet 40 mg (40 mg Oral Given 04/02/23 1723)  furosemide (LASIX) injection 20 mg (has no administration in time range)  Warfarin - Pharmacist Dosing Inpatient (has no administration in time range)  atorvastatin (LIPITOR) tablet 40 mg (has no administration in time range)  colchicine tablet 0.6 mg (has no administration in time range)  losartan (COZAAR) tablet 100 mg (has no administration in time range)  metoprolol tartrate (LOPRESSOR) tablet 75 mg (has no administration in time range)  potassium chloride (KLOR-CON M) CR tablet 30 mEq (has no administration in time range)  furosemide (LASIX) injection 20 mg (20 mg Intravenous Given 04/02/23 1350)  warfarin (COUMADIN) tablet 7.5 mg (7.5 mg Oral Given 04/02/23 1642)  magnesium sulfate IVPB 2 g 50 mL (2 g Intravenous New Bag/Given 04/02/23 1607)    ED Course/ Medical Decision Making/ A&P Clinical Course as of 04/02/23 1730  Tue Apr 02, 2023  1352 Reviewed results of work up with patient and and family.  He is agreeable to admission.  Does not look like he was really on a heavy diuretic.  Ordering him Lasix here.  Discussed with Dr. Laural Benes [facility] hospital[facility] who will evaluate for admission. [MB]    Clinical Course User Index [MB] Terrilee Files, MD                                 Medical Decision Making Amount and/or Complexity of Data Reviewed Labs: ordered. Radiology: ordered.  Risk Prescription drug management. Decision regarding hospitalization.   This patient complains of shortness of breath dyspnea on exertion orthopnea leg swelling; this involves an extensive number of treatment Options and is a complaint that carries with it a high risk of complications and morbidity. The differential includes CHF, pneumonia,  pneumothorax, PE, vascular  I ordered, reviewed and interpreted labs, which included CBC with normal white count  slightly lower hemoglobin than baseline, chemistries and LFTs unremarkable, troponins flat, BNP elevated, COVID and flu negative, INR therapeutic I ordered medication IV Lasix and reviewed PMP when indicated. I ordered imaging studies which included chest x-ray and I independently    visualized and interpreted imaging which showed cardiomegaly and increased interstitial edema Additional history obtained from patient's family Previous records obtained and reviewed in epic including recent cardiology notes I consulted [facility] hospitalist Dr. Laural Benes and discussed lab and imaging findings and discussed disposition.  Cardiac monitoring reviewed, atrial fibrillation Social determinants considered, no significant barriers Critical Interventions: None  After the interventions stated above, I reevaluated the patient and found patient to be fairly asymptomatic at rest Admission and further testing considered, he would benefit from mission to the hospital for further workup and management of his heart failure.  Patient in agreement with plan for admission.         Final Clinical Impression(s) / ED Diagnoses Final diagnoses:  Acute congestive heart failure, unspecified heart failure type (HCC)  Atrial fibrillation, unspecified type [facility])    Rx / DC Orders ED Discharge Orders     None         Terrilee Files, MD 04/02/23 1733

## 2023-04-02 NOTE — Plan of Care (Signed)

## 2023-04-03 ENCOUNTER — Inpatient Hospital Stay (HOSPITAL_COMMUNITY)

## 2023-04-03 DIAGNOSIS — D6869 Other thrombophilia: Secondary | ICD-10-CM | POA: Diagnosis not present

## 2023-04-03 DIAGNOSIS — E1159 Type 2 diabetes mellitus with other circulatory complications: Secondary | ICD-10-CM | POA: Diagnosis not present

## 2023-04-03 DIAGNOSIS — G4733 Obstructive sleep apnea (adult) (pediatric): Secondary | ICD-10-CM

## 2023-04-03 DIAGNOSIS — D649 Anemia, unspecified: Secondary | ICD-10-CM | POA: Diagnosis not present

## 2023-04-03 DIAGNOSIS — R7989 Other specified abnormal findings of blood chemistry: Secondary | ICD-10-CM

## 2023-04-03 DIAGNOSIS — I5031 Acute diastolic (congestive) heart failure: Secondary | ICD-10-CM | POA: Diagnosis not present

## 2023-04-03 DIAGNOSIS — I4819 Other persistent atrial fibrillation: Secondary | ICD-10-CM

## 2023-04-03 DIAGNOSIS — E7849 Other hyperlipidemia: Secondary | ICD-10-CM

## 2023-04-03 LAB — CBC
HCT: 34.8 % — ABNORMAL LOW (ref 39.0–52.0)
Hemoglobin: 11 g/dL — ABNORMAL LOW (ref 13.0–17.0)
MCH: 30.1 pg (ref 26.0–34.0)
MCHC: 31.6 g/dL (ref 30.0–36.0)
MCV: 95.3 fL (ref 80.0–100.0)
Platelets: 204 10*3/uL (ref 150–400)
RBC: 3.65 MIL/uL — ABNORMAL LOW (ref 4.22–5.81)
RDW: 15.5 % (ref 11.5–15.5)
WBC: 8.3 10*3/uL (ref 4.0–10.5)
nRBC: 0 % (ref 0.0–0.2)

## 2023-04-03 LAB — MAGNESIUM: Magnesium: 1.9 mg/dL (ref 1.7–2.4)

## 2023-04-03 LAB — ECHOCARDIOGRAM COMPLETE
Area-P 1/2: 3.53 cm2
Calc EF: 55.8 %
Est EF: 55
Height: 72 in
S' Lateral: 2.3 cm
Single Plane A2C EF: 45.2 %
Single Plane A4C EF: 65.7 %
Weight: 4511.49 [oz_av]

## 2023-04-03 LAB — BASIC METABOLIC PANEL
Anion gap: 6 (ref 5–15)
BUN: 19 mg/dL (ref 8–23)
CO2: 29 mmol/L (ref 22–32)
Calcium: 8.8 mg/dL — ABNORMAL LOW (ref 8.9–10.3)
Chloride: 102 mmol/L (ref 98–111)
Creatinine, Ser: 1.29 mg/dL — ABNORMAL HIGH (ref 0.61–1.24)
GFR, Estimated: 58 mL/min — ABNORMAL LOW (ref >60)
Glucose, Bld: 82 mg/dL (ref 70–99)
Potassium: 3.8 mmol/L (ref 3.5–5.1)
Sodium: 137 mmol/L (ref 135–145)

## 2023-04-03 LAB — GLUCOSE, CAPILLARY
Glucose-Capillary: 95 mg/dL (ref 70–99)
Glucose-Capillary: 148 mg/dL — ABNORMAL HIGH (ref 70–99)
Glucose-Capillary: 119 mg/dL — ABNORMAL HIGH (ref 70–99)
Glucose-Capillary: 148 mg/dL — ABNORMAL HIGH (ref 70–99)

## 2023-04-03 LAB — PROTIME-INR
INR: 2.5 — ABNORMAL HIGH (ref 0.8–1.2)
Prothrombin Time: 27 s — ABNORMAL HIGH (ref 11.4–15.2)

## 2023-04-03 MED ORDER — PERFLUTREN LIPID MICROSPHERE
1.0000 mL | INTRAVENOUS | Status: AC | PRN
  Administered 2023-04-03: 1 mL via INTRAVENOUS

## 2023-04-03 MED ORDER — WARFARIN SODIUM 7.5 MG PO TABS
7.5000 mg | ORAL_TABLET | Freq: Once | ORAL | Status: AC
Start: 2023-04-03 — End: 2023-04-03
  Administered 2023-04-03: 7.5 mg via ORAL
  Filled 2023-04-03: qty 1

## 2023-04-03 MED ORDER — FUROSEMIDE 10 MG/ML IJ SOLN
40.0000 mg | Freq: Two times a day (BID) | INTRAMUSCULAR | Status: DC
  Administered 2023-04-03 – 2023-04-04 (×2): 40 mg via INTRAVENOUS
  Filled 2023-04-03 (×2): qty 4

## 2023-04-03 NOTE — Progress Notes (Signed)
Patient declines CPAP. No unit in room at this time. 

## 2023-04-03 NOTE — Progress Notes (Signed)
 PROGRESS NOTE    Miguel Cox  MWU:[id number] DOB: [birth year] DOA: 04/02/2023 PCP: Catalina Lunger, DO  Chief Complaint  Patient presents with   Shortness of Breath    Hospital Course:  Miguel Cox is 73 y.o. male with hypertension, hyperlipidemia, diastolic heart failure, type 2 diabetes, atrial fibrillation on warfarin, chronic dilation of aorta, who presented to the ED complaining of worsening dyspnea, lower extremity edema, and orthopnea.  The ED is found to have elevated BNP, pulmonary edema on chest x-ray, and elevated blood pressures.  He was admitted for heart failure exacerbation.  Subjective: No acute events overnight. On evaluation today patient reports he is feeling much better but still very swollen beyond his baseline.   Objective: Vitals:   04/02/23 2117 04/02/23 2156 04/03/23 0500 04/03/23 0537  BP: (!) 146/71 (!) 146/71  119/61  Pulse: 66 66  61  Resp: 19   17  Temp: 97.9 F (36.6 C)   98.1 F (36.7 C)  TempSrc: Oral   Oral  SpO2: 96%   95%  Weight:   127.9 kg   Height:        Intake/Output Summary (Last 24 hours) at 04/03/2023 0946 Last data filed at 04/03/2023 0500 Gross per 24 hour  Intake 568.73 ml  Output 2250 ml  Net -1681.27 ml   Filed Weights   04/02/23 1114 04/03/23 0500  Weight: 127 kg 127.9 kg    Examination: General exam: Appears calm and comfortable, NAD  Respiratory system: No work of breathing, symmetric chest wall expansion Cardiovascular system: S1 & S2 heard, RRR.  Gastrointestinal system: Abdomen is nondistended, soft and nontender.  Neuro: Alert and oriented. No focal neurological deficits. Extremities: 2+ pitting edema Skin: No rashes, lesions Psychiatry: Demonstrates appropriate judgement and insight. Mood & affect appropriate for situation.   Assessment & Plan:  Principal Problem:   Acute heart failure (HCC) Active Problems:   Acquired thrombophilia (HCC)   Persistent atrial fibrillation (HCC)   HTN, goal below  140/90   HLD (hyperlipidemia)   OSA (obstructive sleep apnea)   Leg swelling   Dilatation of aorta (HCC)   Normocytic anemia   Elevated brain natriuretic peptide (BNP) level   Type 2 diabetes mellitus    Acute heart failure preserved EF - BNP 244 on arrival with severe lower extremity edema - Echocardiogram LVEF 55%, left ventricle internal cavity mildly dilated.  Diastolic parameters are indeterminate.  Right ventricular function is moderately reduced.  Right ventricular size mildly enlarged. - Continue IV diuresis, titrate to p.o. as tolerated - Continue to monitor creatinine closely as it appears to be uptrending. - Low-sodium diet - Strict I's and O's, down 2L so far - Daily weights - GDMT as BP tolerates - Optimize electrolytes  Bilateral lower extremity edema - Secondary to heart failure as above - Additionally was taking amlodipine outpatient, will discontinue this in favor of alternative antihypertensives to avoid further complicating lower extremity edema picture  On anticoagulation - Daily warfarin - INR therapeutic on arrival - Continue warfarin management by Pharm.D. - Daily PT/INR  Chronic A-fib - Continue home dose metoprolol and warfarin as above - Echocardiogram as above  Normocytic anemia - Anemia panel: low TSat, Iron WNL. - Trend CBC  Type 2 diabetes - Hemoglobin A1c: 5.7%.  Continue with just daily glucose checks for now.  No indication for sliding scale insulin at this time  Hypertension - Hold amlodipine as above - As needed antihypertensives, titrate as tolerated  OSA - CPAP  at night  Dilation of aorta - Patient is planned for CT angio outpatient, being monitored by cardiology.  Continue close outpatient follow-up   DVT prophylaxis: warfarin   Code Status: Full Code Family Communication:  Discussed directly with patient Disposition:  Inpatient still hospitalized for IV Diuresis, will discharge to home when euvolemic on PO  lasix.  Consultants:    Procedures:    Antimicrobials:  Anti-infectives (From admission, onward)    None       Data Reviewed: I have personally reviewed following labs and imaging studies CBC: Recent Labs  Lab 04/02/23 1235 04/03/23 0335  WBC 7.8 8.3  NEUTROABS 6.0  --   HGB 10.8* 11.0*  HCT 34.1* 34.8*  MCV 95.3 95.3  PLT 217 204   Basic Metabolic Panel: Recent Labs  Lab 04/02/23 1235 04/03/23 0335  NA 138 137  K 3.9 3.8  CL 102 102  CO2 27 29  GLUCOSE 95 82  BUN 19 19  CREATININE 1.19 1.29*  CALCIUM 8.9 8.8*  MG 1.8 1.9   GFR: Estimated Creatinine Clearance: 69.4 mL/min (A) (by C-G formula based on SCr of 1.29 mg/dL (H)). Liver Function Tests: Recent Labs  Lab 04/02/23 1235  AST 22  ALT 21  ALKPHOS 60  BILITOT 1.0  PROT 6.7  ALBUMIN 3.5   CBG: Recent Labs  Lab 04/02/23 1615 04/02/23 2016 04/03/23 0747  GLUCAP 83 127* 95    Recent Results (from the past 240 hours)  Resp panel by RT-PCR (RSV, Flu A&B, Covid) Anterior Nasal Swab     Status: None   Collection Time: 04/02/23 12:47 PM   Specimen: Anterior Nasal Swab  Result Value Ref Range Status   SARS Coronavirus 2 by RT PCR NEGATIVE NEGATIVE Final    Comment: (NOTE) SARS-CoV-2 target nucleic acids are NOT DETECTED.  The SARS-CoV-2 RNA is generally detectable in upper respiratory specimens during the acute phase of infection. The lowest concentration of SARS-CoV-2 viral copies this assay can detect is 138 copies/mL. A negative result does not preclude SARS-Cov-2 infection and should not be used as the sole basis for treatment or other patient management decisions. A negative result may occur with  improper specimen collection/handling, submission of specimen other than nasopharyngeal swab, presence of viral mutation(s) within the areas targeted by this assay, and inadequate number of viral copies(<138 copies/mL). A negative result must be combined with clinical observations, patient  history, and epidemiological information. The expected result is Negative.  Fact Sheet for Patients:  BloggerCourse.com  Fact Sheet for Healthcare Providers:  SeriousBroker.it  This test is no t yet approved or cleared by the [place] FDA and  has been authorized for detection and/or diagnosis of SARS-CoV-2 by FDA under an Emergency Use Authorization (EUA). This EUA will remain  in effect (meaning this test can be used) for the duration of the COVID-19 declaration under Section 564(b)(1) of the Act, 21 U.S.C.section 360bbb-3(b)(1), unless the authorization is terminated  or revoked sooner.       Influenza A by PCR NEGATIVE NEGATIVE Final   Influenza B by PCR NEGATIVE NEGATIVE Final    Comment: (NOTE) The Xpert Xpress SARS-CoV-2/FLU/RSV plus assay is intended as an aid in the diagnosis of influenza from Nasopharyngeal swab specimens and should not be used as a sole basis for treatment. Nasal washings and aspirates are unacceptable for Xpert Xpress SARS-CoV-2/FLU/RSV testing.  Fact Sheet for Patients: BloggerCourse.com  Fact Sheet for Healthcare Providers: SeriousBroker.it  This test is not yet approved or  cleared by the [place] and has been authorized for detection and/or diagnosis of SARS-CoV-2 by FDA under an Emergency Use Authorization (EUA). This EUA will remain in effect (meaning this test can be used) for the duration of the COVID-19 declaration under Section 564(b)(1) of the Act, 21 U.S.C. section 360bbb-3(b)(1), unless the authorization is terminated or revoked.     Resp Syncytial Virus by PCR NEGATIVE NEGATIVE Final    Comment: (NOTE) Fact Sheet for Patients: BloggerCourse.com  Fact Sheet for Healthcare Providers: SeriousBroker.it  This test is not yet approved or cleared by the [place] FDA  and has been authorized for detection and/or diagnosis of SARS-CoV-2 by FDA under an Emergency Use Authorization (EUA). This EUA will remain in effect (meaning this test can be used) for the duration of the COVID-19 declaration under Section 564(b)(1) of the Act, 21 U.S.C. section 360bbb-3(b)(1), unless the authorization is terminated or revoked.  Performed at [facility], [address] [place], [place] [address]      Radiology Studies: [facility] Chest [facility] 1 View Result Date: 04/02/2023 CLINICAL DATA:  Shortness of breath. EXAM: PORTABLE CHEST 1 VIEW COMPARISON:  December 01, 2018. FINDINGS: Stable cardiomediastinal silhouette. Mild central pulmonary vascular congestion is noted and minimal pulmonary edema cannot be excluded. Bony thorax is unremarkable. IMPRESSION: Mild central pulmonary vascular congestion is noted with possible minimal pulmonary edema. Electronically Signed   By: Lupita Raider M.D.   On: 04/02/2023 13:20    Scheduled Meds:  atorvastatin  40 mg Oral QHS   furosemide  20 mg Intravenous Q8H   insulin aspart  0-15 Units Subcutaneous TID WC   losartan  100 mg Oral Daily   metoprolol tartrate  75 mg Oral BID   pantoprazole  40 mg Oral QPM   potassium chloride  30 mEq Oral BID   sodium chloride flush  3 mL Intravenous Q12H   Warfarin - Pharmacist Dosing Inpatient   Does not apply q1600   Continuous Infusions:  sodium chloride       LOS: 1 day   Total time spent interpreting labs and vitals, coordinating care amongst consultants and care team members, directly assessing and discussing care with the patient and/or family: 55 min  Debarah Crape, DO [facility]  To contact the attending physician between 7A-7P please use Epic Chat. To contact the covering physician during after hours 7P-7A, please review Amion.   04/03/2023, 9:46 AM   *This document has been created with the assistance of dictation software. Please excuse typographical errors. *

## 2023-04-03 NOTE — Plan of Care (Signed)

## 2023-04-03 NOTE — Progress Notes (Signed)
   04/03/23 1353  TOC Brief Assessment  Insurance and Status Reviewed  Patient has primary care physician Yes  Home environment has been reviewed Home with family  Prior level of function: independent  Prior/Current Home Services No current home services  Social Drivers of Health Review SDOH reviewed no interventions necessary  Readmission risk has been reviewed Yes  Transition of care needs no transition of care needs at this time   CHF education provided. Echo pending. TOC following to offer home health. Patient is diuresing.

## 2023-04-03 NOTE — Progress Notes (Signed)
 PHARMACY - ANTICOAGULATION CONSULT NOTE  Pharmacy Consult for warfarin Indication: atrial fibrillation  Allergies  Allergen Reactions   Other Swelling    Pt can't eat anything from [facility], it causes swelling   Penicillins Hives and Itching    Did it involve swelling of the face/tongue/throat, SOB, or low BP? No Did it involve sudden or severe rash/hives, skin peeling, or any reaction on the inside of your mouth or nose? No Did you need to seek medical attention at a hospital or doctor's office? No When did it last happen?       If all above answers are "NO", may proceed with cephalosporin use. Tolerated Cephalosporin Date: 07/27/21.     Cefazolin Rash    Patient Measurements: Height: 6' (182.9 cm) Weight: 127.9 kg (281 lb 15.5 oz) IBW/kg (Calculated) : 77.6  Vital Signs: Temp: 98.1 F (36.7 C) (03/05 0537) Temp Source: Oral (03/05 0537) BP: 119/61 (03/05 0537) Pulse Rate: 61 (03/05 0537)  Labs: Recent Labs    04/02/23 1235 04/03/23 0335  HGB 10.8* 11.0*  HCT 34.1* 34.8*  PLT 217 204  LABPROT 26.8* 27.0*  INR 2.5* 2.5*  CREATININE 1.19 1.29*  TROPONINIHS 16  --     Estimated Creatinine Clearance: 69.4 mL/min (A) (by C-G formula based on SCr of 1.29 mg/dL (H)).   Medical History: Past Medical History:  Diagnosis Date   Atrial fibrillation (HCC)    Diagnosed 2020   Diverticula of intestine    DJD (degenerative joint disease)    Dysrhythmia    Essential hypertension    Gout    OSA (obstructive sleep apnea)    Intolerant of CPAP   Osteoarthritis    Polio    Type 2 diabetes mellitus (HCC)     Medications:  See med rec  Assessment: Pt arrived from home c/o worsening SOB X 4 days. Pt reports bilateral leg swelling. Pt reports difficulty breathing worse at night.  Patient is chronically anticoagulated with warfarin for afib. Pharmacy asked to dose.   INR 2.5, therapeutic  Seen in anticoag clinic. Home dose is 9 mg on Monday and 7.5mg  all other  days   Goal of Therapy:  INR 2-3 Monitor platelets by anticoagulation protocol: Yes   Plan:  Coumadin 7.5mg  po x 1 today PT-INR daily Monitor for S/S of bleeding  Elder Cyphers, BS Pharm D, BCPS Clinical Pharmacist 04/03/2023,11:06 AM

## 2023-04-04 ENCOUNTER — Telehealth (HOSPITAL_COMMUNITY): Payer: Self-pay

## 2023-04-04 ENCOUNTER — Other Ambulatory Visit (HOSPITAL_COMMUNITY): Payer: Self-pay

## 2023-04-04 ENCOUNTER — Encounter (HOSPITAL_COMMUNITY): Payer: Self-pay | Admitting: Family Medicine

## 2023-04-04 DIAGNOSIS — E1159 Type 2 diabetes mellitus with other circulatory complications: Secondary | ICD-10-CM | POA: Diagnosis not present

## 2023-04-04 DIAGNOSIS — I10 Essential (primary) hypertension: Secondary | ICD-10-CM

## 2023-04-04 DIAGNOSIS — N179 Acute kidney failure, unspecified: Secondary | ICD-10-CM

## 2023-04-04 DIAGNOSIS — I509 Heart failure, unspecified: Secondary | ICD-10-CM

## 2023-04-04 DIAGNOSIS — D6869 Other thrombophilia: Secondary | ICD-10-CM | POA: Diagnosis not present

## 2023-04-04 LAB — BASIC METABOLIC PANEL
Anion gap: 7 (ref 5–15)
BUN: 23 mg/dL (ref 8–23)
CO2: 28 mmol/L (ref 22–32)
Calcium: 8.8 mg/dL — ABNORMAL LOW (ref 8.9–10.3)
Chloride: 100 mmol/L (ref 98–111)
Creatinine, Ser: 1.46 mg/dL — ABNORMAL HIGH (ref 0.61–1.24)
GFR, Estimated: 50 mL/min — ABNORMAL LOW (ref >60)
Glucose, Bld: 119 mg/dL — ABNORMAL HIGH (ref 70–99)
Potassium: 3.7 mmol/L (ref 3.5–5.1)
Sodium: 135 mmol/L (ref 135–145)

## 2023-04-04 LAB — GLUCOSE, CAPILLARY
Glucose-Capillary: 127 mg/dL — ABNORMAL HIGH (ref 70–99)
Glucose-Capillary: 112 mg/dL — ABNORMAL HIGH (ref 70–99)
Glucose-Capillary: 150 mg/dL — ABNORMAL HIGH (ref 70–99)
Glucose-Capillary: 138 mg/dL — ABNORMAL HIGH (ref 70–99)

## 2023-04-04 LAB — PROTIME-INR
INR: 2.4 — ABNORMAL HIGH (ref 0.8–1.2)
Prothrombin Time: 26.1 s — ABNORMAL HIGH (ref 11.4–15.2)

## 2023-04-04 LAB — CBC
HCT: 36.6 % — ABNORMAL LOW (ref 39.0–52.0)
Hemoglobin: 11.6 g/dL — ABNORMAL LOW (ref 13.0–17.0)
MCH: 29.7 pg (ref 26.0–34.0)
MCHC: 31.7 g/dL (ref 30.0–36.0)
MCV: 93.8 fL (ref 80.0–100.0)
Platelets: 234 10*3/uL (ref 150–400)
RBC: 3.9 MIL/uL — ABNORMAL LOW (ref 4.22–5.81)
RDW: 15.4 % (ref 11.5–15.5)
WBC: 7.9 10*3/uL (ref 4.0–10.5)
nRBC: 0 % (ref 0.0–0.2)

## 2023-04-04 MED ORDER — FUROSEMIDE 40 MG PO TABS
40.0000 mg | ORAL_TABLET | Freq: Two times a day (BID) | ORAL | Status: DC
  Administered 2023-04-04 – 2023-04-05 (×2): 40 mg via ORAL
  Filled 2023-04-04 (×2): qty 1

## 2023-04-04 MED ORDER — WARFARIN SODIUM 7.5 MG PO TABS
7.5000 mg | ORAL_TABLET | Freq: Once | ORAL | Status: AC
  Administered 2023-04-04: 7.5 mg via ORAL
  Filled 2023-04-04: qty 1

## 2023-04-04 NOTE — Plan of Care (Signed)
  Problem: Nutritional: Goal: Maintenance of adequate nutrition will improve Outcome: Progressing   Problem: Activity: Goal: Capacity to carry out activities will improve Outcome: Progressing   Problem: Cardiac: Goal: Ability to achieve and maintain adequate cardiopulmonary perfusion will improve Outcome: Progressing

## 2023-04-04 NOTE — Progress Notes (Signed)
 PHARMACY - ANTICOAGULATION CONSULT NOTE  Pharmacy Consult for warfarin Indication: atrial fibrillation  Allergies  Allergen Reactions   Other Swelling    Pt can't eat anything from [facility], it causes swelling   Penicillins Hives and Itching    Did it involve swelling of the face/tongue/throat, SOB, or low BP? No Did it involve sudden or severe rash/hives, skin peeling, or any reaction on the inside of your mouth or nose? No Did you need to seek medical attention at a hospital or doctor's office? No When did it last happen?       If all above answers are "NO", may proceed with cephalosporin use. Tolerated Cephalosporin Date: 07/27/21.     Cefazolin Rash    Patient Measurements: Height: 6' (182.9 cm) Weight: 125.8 kg (277 lb 5.4 oz) IBW/kg (Calculated) : 77.6  Vital Signs: Temp: 98 F (36.7 C) (03/06 0451) Temp Source: Oral (03/06 0451) BP: 144/64 (03/06 0451) Pulse Rate: 63 (03/06 0451)  Labs: Recent Labs    04/02/23 1235 04/03/23 0335 04/04/23 0425  HGB 10.8* 11.0* 11.6*  HCT 34.1* 34.8* 36.6*  PLT 217 204 234  LABPROT 26.8* 27.0* 26.1*  INR 2.5* 2.5* 2.4*  CREATININE 1.19 1.29* 1.46*  TROPONINIHS 16  --   --     Estimated Creatinine Clearance: 60.8 mL/min (A) (by C-G formula based on SCr of 1.46 mg/dL (H)).   Medical History: Past Medical History:  Diagnosis Date   Atrial fibrillation (HCC)    Diagnosed 2020   Diverticula of intestine    DJD (degenerative joint disease)    Dysrhythmia    Essential hypertension    Gout    OSA (obstructive sleep apnea)    Intolerant of CPAP   Osteoarthritis    Polio    Type 2 diabetes mellitus (HCC)     Medications:  See med rec  Assessment: Pt arrived from home c/o worsening SOB X 4 days. Pt reports bilateral leg swelling. Pt reports difficulty breathing worse at night.  Patient is chronically anticoagulated with warfarin for afib. Pharmacy asked to dose.   INR therapeutic today at 2.4. No bleeding issues  noted. CBC stable.   Seen in anticoag clinic. Home dose is 9 mg on Monday and 7.5mg  all other days   Goal of Therapy:  INR 2-3 Monitor platelets by anticoagulation protocol: Yes   Plan:  Coumadin 7.5mg  po x 1 today PT-INR daily Monitor for S/S of bleeding  Sheppard Coil PharmD., BCPS Clinical Pharmacist 04/04/2023 2:30 PM

## 2023-04-04 NOTE — Progress Notes (Signed)
 PROGRESS NOTE    Miguel Cox  ZOX:[id number] DOB: October 07, 1950 DOA: 04/02/2023 PCP: Catalina Lunger, DO  Chief Complaint  Patient presents with   Shortness of Breath    Hospital Course:  Miguel Cox is 73 y.o. male with hypertension, hyperlipidemia, diastolic heart failure, type 2 diabetes, atrial fibrillation on warfarin, chronic dilation of aorta, who presented to the ED complaining of worsening dyspnea, lower extremity edema, and orthopnea.  The ED is found to have elevated BNP, pulmonary edema on chest x-ray, and elevated blood pressures.  He was admitted for heart failure exacerbation.  Subjective: No acute events overnight. Continues to improve with diuresis   Objective: Vitals:   04/03/23 1306 04/03/23 2009 04/04/23 0451 04/04/23 1438  BP: (!) 141/78 127/75 (!) 144/64 (!) 117/58  Pulse: (!) 55 60 63 66  Resp: 17 16 19 20   Temp: 98.2 F (36.8 C) 98.2 F (36.8 C) 98 F (36.7 C) 98.3 F (36.8 C)  TempSrc:  Oral Oral Oral  SpO2: (!) 86% 92% 97% 97%  Weight:   125.8 kg   Height:        Intake/Output Summary (Last 24 hours) at 04/04/2023 1536 Last data filed at 04/04/2023 1438 Gross per 24 hour  Intake 720 ml  Output 3700 ml  Net -2980 ml   Filed Weights   04/02/23 1114 04/03/23 0500 04/04/23 0451  Weight: 127 kg 127.9 kg 125.8 kg    Examination: General exam: Appears calm and comfortable, NAD  Respiratory system: No work of breathing, symmetric chest wall expansion Cardiovascular system: S1 & S2 heard, RRR.  Gastrointestinal system: Abdomen is nondistended, soft and nontender.  Neuro: Alert and oriented. No focal neurological deficits. Extremities: 2+ pitting edema, erythema bilateral lower extremities in calves Skin: No rashes, lesions Psychiatry: Demonstrates appropriate judgement and insight. Mood & affect appropriate for situation.   Assessment & Plan:  Principal Problem:   Acute heart failure (HCC) Active Problems:   Acquired thrombophilia (HCC)    Persistent atrial fibrillation (HCC)   HTN, goal below 140/90   HLD (hyperlipidemia)   OSA (obstructive sleep apnea)   Leg swelling   Dilatation of aorta (HCC)   Normocytic anemia   Elevated brain natriuretic peptide (BNP) level   Type 2 diabetes mellitus    Acute heart failure preserved EF - BNP 244 on arrival with severe lower extremity edema - Echocardiogram LVEF 55%, left ventricle internal cavity mildly dilated.  Diastolic parameters are indeterminate.  Right ventricular function is moderately reduced.  Right ventricular size mildly enlarged. - Continue IV diuresis, switch to PO tomorrow. - Continue to monitor creatinine closely as it appears to be uptrending.  - Low-sodium diet - Strict I's and O's, down 4L so far - Daily weights - GDMT as BP tolerates (Farxiga will be cost prohibitive) - Optimize electrolytes  Bilateral lower extremity edema - Secondary to heart failure as above - Additionally was taking amlodipine outpatient, will discontinue this in favor of alternative antihypertensives to avoid further complicating lower extremity edema picture  AKI - Baseline creatinine ~1.0, up to 1.46 today -- Acute worsening likely 2/2 to diuresis. Will switch to PO lasix tomorrow -- renally dose with CrCl 60 when needed  On anticoagulation - Daily warfarin - INR therapeutic on arrival - Continue warfarin management by Pharm.D. - Daily PT/INR  Chronic A-fib - Continue home dose metoprolol and warfarin as above - Echocardiogram as above  Normocytic anemia - Anemia panel: low TSat, Iron WNL. - Trend CBC  Type  2 diabetes - Hemoglobin A1c: 5.7%.  Continue with just daily glucose checks for now.  No indication for sliding scale insulin at this time  Hypertension - Hold amlodipine as above. BP currently at goal. - As needed antihypertensives, titrate as tolerated  OSA - CPAP at night  Dilation of aorta - Patient is planned for CT angio outpatient, being monitored by  cardiology.  Continue close outpatient follow-up   DVT prophylaxis: warfarin   Code Status: Full Code Family Communication:  Discussed directly with patient Disposition:  Inpatient still hospitalized for IV Diuresis, will discharge to home when euvolemic on PO lasix, hopefully tomorrow.  Consultants:    Procedures:    Antimicrobials:  Anti-infectives (From admission, onward)    None       Data Reviewed: I have personally reviewed following labs and imaging studies CBC: Recent Labs  Lab 04/02/23 1235 04/03/23 0335 04/04/23 0425  WBC 7.8 8.3 7.9  NEUTROABS 6.0  --   --   HGB 10.8* 11.0* 11.6*  HCT 34.1* 34.8* 36.6*  MCV 95.3 95.3 93.8  PLT 217 204 234   Basic Metabolic Panel: Recent Labs  Lab 04/02/23 1235 04/03/23 0335 04/04/23 0425  NA 138 137 135  K 3.9 3.8 3.7  CL 102 102 100  CO2 27 29 28   GLUCOSE 95 82 119*  BUN 19 19 23   CREATININE 1.19 1.29* 1.46*  CALCIUM 8.9 8.8* 8.8*  MG 1.8 1.9  --    GFR: Estimated Creatinine Clearance: 60.8 mL/min (A) (by C-G formula based on SCr of 1.46 mg/dL (H)). Liver Function Tests: Recent Labs  Lab 04/02/23 1235  AST 22  ALT 21  ALKPHOS 60  BILITOT 1.0  PROT 6.7  ALBUMIN 3.5   CBG: Recent Labs  Lab 04/03/23 1144 04/03/23 1606 04/03/23 2008 04/04/23 0733 04/04/23 1157  GLUCAP 148* 119* 148* 127* 112*    Recent Results (from the past 240 hours)  Resp panel by RT-PCR (RSV, Flu A&B, Covid) Anterior Nasal Swab     Status: None   Collection Time: 04/02/23 12:47 PM   Specimen: Anterior Nasal Swab  Result Value Ref Range Status   SARS Coronavirus 2 by RT PCR NEGATIVE NEGATIVE Final    Comment: (NOTE) SARS-CoV-2 target nucleic acids are NOT DETECTED.  The SARS-CoV-2 RNA is generally detectable in upper respiratory specimens during the acute phase of infection. The lowest concentration of SARS-CoV-2 viral copies this assay can detect is 138 copies/mL. A negative result does not preclude  SARS-Cov-2 infection and should not be used as the sole basis for treatment or other patient management decisions. A negative result may occur with  improper specimen collection/handling, submission of specimen other than nasopharyngeal swab, presence of viral mutation(s) within the areas targeted by this assay, and inadequate number of viral copies(<138 copies/mL). A negative result must be combined with clinical observations, patient history, and epidemiological information. The expected result is Negative.  Fact Sheet for Patients:  BloggerCourse.com  Fact Sheet for Healthcare Providers:  SeriousBroker.it  This test is no t yet approved or cleared by the [place] FDA and  has been authorized for detection and/or diagnosis of SARS-CoV-2 by FDA under an Emergency Use Authorization (EUA). This EUA will remain  in effect (meaning this test can be used) for the duration of the COVID-19 declaration under Section 564(b)(1) of the Act, 21 U.S.C.section 360bbb-3(b)(1), unless the authorization is terminated  or revoked sooner.       Influenza A by PCR NEGATIVE NEGATIVE  Final   Influenza B by PCR NEGATIVE NEGATIVE Final    Comment: (NOTE) The Xpert Xpress SARS-CoV-2/FLU/RSV plus assay is intended as an aid in the diagnosis of influenza from Nasopharyngeal swab specimens and should not be used as a sole basis for treatment. Nasal washings and aspirates are unacceptable for Xpert Xpress SARS-CoV-2/FLU/RSV testing.  Fact Sheet for Patients: BloggerCourse.com  Fact Sheet for Healthcare Providers: SeriousBroker.it  This test is not yet approved or cleared by the [place] FDA and has been authorized for detection and/or diagnosis of SARS-CoV-2 by FDA under an Emergency Use Authorization (EUA). This EUA will remain in effect (meaning this test can be used) for the duration of  the COVID-19 declaration under Section 564(b)(1) of the Act, 21 U.S.C. section 360bbb-3(b)(1), unless the authorization is terminated or revoked.     Resp Syncytial Virus by PCR NEGATIVE NEGATIVE Final    Comment: (NOTE) Fact Sheet for Patients: BloggerCourse.com  Fact Sheet for Healthcare Providers: SeriousBroker.it  This test is not yet approved or cleared by the [place] FDA and has been authorized for detection and/or diagnosis of SARS-CoV-2 by FDA under an Emergency Use Authorization (EUA). This EUA will remain in effect (meaning this test can be used) for the duration of the COVID-19 declaration under Section 564(b)(1) of the Act, 21 U.S.C. section 360bbb-3(b)(1), unless the authorization is terminated or revoked.  Performed at [facility], [address] [place], [place] [address]      Radiology Studies: ECHOCARDIOGRAM COMPLETE Result Date: 04/03/2023    ECHOCARDIOGRAM REPORT   Patient Name:   Miguel Cox Date of Exam: 04/03/2023 Medical Rec #:  [id number]       Height:       72.0 in Accession #:    [id number]      Weight:       282.0 lb Date of Birth:  1950-02-05      BSA:          2.465 m Patient Age:    72 years        BP:           148/71 mmHg Patient Gender: M               HR:           54 bpm. [facility] Procedure: 2D Echo, Color Doppler, Cardiac Doppler and Intracardiac            Opacification Agent (Both Spectral and Color Flow Doppler were            utilized during procedure). Indications:    Congestive Heart Failure I50.9  History:        Patient has prior history of Echocardiogram examinations, most                 recent 08/31/2019. CHF; Risk Factors:Hypertension, Diabetes and                 HLD.  Sonographer:    Webb Laws Referring Phys: [id number] CLANFORD L JOHNSON IMPRESSIONS  1. Poor acoustic windows limit study, even with Defininty RV difficult to see.  2. Left ventricular ejection  fraction, by estimation, is 55%. The left ventricular internal cavity size was mildly dilated. Left ventricular diastolic parameters are indeterminate.  3. Right ventricular systolic function is moderately reduced. The right ventricular size is mildly enlarged.  4. The mitral valve is normal in structure. Trivial mitral valve regurgitation.  5. The aortic valve is tricuspid.  Aortic valve regurgitation is trivial. Aortic valve sclerosis/calcification is present, without any evidence of aortic stenosis.  6. The inferior vena cava is dilated in size with >50% respiratory variability, suggesting right atrial pressure of 8 mmHg. FINDINGS  Left Ventricle: Left ventricular ejection fraction, by estimation, is 55%. Definity contrast agent was given IV to delineate the left ventricular endocardial borders. The left ventricular internal cavity size was mildly dilated. There is no left ventricular hypertrophy. Left ventricular diastolic parameters are indeterminate. Right Ventricle: The right ventricular size is mildly enlarged. Right vetricular wall thickness was not assessed. Right ventricular systolic function is moderately reduced. Left Atrium: Left atrial size was normal in size. Right Atrium: Right atrial size was normal in size. Pericardium: There is no evidence of pericardial effusion. Mitral Valve: The mitral valve is normal in structure. Mild mitral annular calcification. Trivial mitral valve regurgitation. Tricuspid Valve: The tricuspid valve is normal in structure. Tricuspid valve regurgitation is trivial. Aortic Valve: The aortic valve is tricuspid. Aortic valve regurgitation is trivial. Aortic valve sclerosis/calcification is present, without any evidence of aortic stenosis. Pulmonic Valve: The pulmonic valve was not well visualized. Pulmonic valve regurgitation is not visualized. No evidence of pulmonic stenosis. Aorta: The aortic root and ascending aorta are structurally normal, with no evidence of dilitation.  Venous: The inferior vena cava is dilated in size with greater than 50% respiratory variability, suggesting right atrial pressure of 8 mmHg. IAS/Shunts: No atrial level shunt detected by color flow Doppler.  LEFT VENTRICLE PLAX 2D LVIDd:         5.90 cm      Diastology LVIDs:         2.30 cm      LV e' medial:    8.92 cm/s LV PW:         1.10 cm      LV E/e' medial:  12.1 LV IVS:        1.00 cm      LV e' lateral:   9.79 cm/s LVOT diam:     2.00 cm      LV E/e' lateral: 11.0 LV SV:         53 LV SV Index:   22 LVOT Area:     3.14 cm  LV Volumes (MOD) LV vol d, MOD A2C: 131.0 ml LV vol d, MOD A4C: 137.0 ml LV vol s, MOD A2C: 71.8 ml LV vol s, MOD A4C: 47.0 ml LV SV MOD A2C:     59.2 ml LV SV MOD A4C:     137.0 ml LV SV MOD BP:      74.3 ml RIGHT VENTRICLE            IVC RV Basal diam:  5.00 cm    IVC diam: 2.70 cm RV Mid diam:    4.50 cm RV S prime:     7.72 cm/s TAPSE (M-mode): 2.2 cm LEFT ATRIUM             Index        RIGHT ATRIUM           Index LA diam:        4.50 cm 1.83 cm/m   RA Area:     20.60 cm LA Vol (A2C):   46.1 ml 18.70 ml/m  RA Volume:   57.00 ml  23.12 ml/m LA Vol (A4C):   52.5 ml 21.29 ml/m LA Biplane Vol: 49.6 ml 20.12 ml/m  AORTIC VALVE LVOT Vmax:   80.30 cm/s  LVOT Vmean:  60.900 cm/s LVOT VTI:    0.169 m  AORTA Ao Root diam: 3.70 cm Ao Asc diam:  4.00 cm MITRAL VALVE MV Area (PHT): 3.53 cm     SHUNTS MV Decel Time: 215 msec     Systemic VTI:  0.17 m MV E velocity: 108.00 cm/s  Systemic Diam: 2.00 cm Dietrich Pates MD Electronically signed by Dietrich Pates MD Signature Date/Time: 04/03/2023/3:16:58 PM    Final    DG CHEST PORT 1 VIEW Result Date: 04/03/2023 CLINICAL DATA:  Chest pain EXAM: PORTABLE CHEST 1 VIEW COMPARISON:  X-ray 04/02/2023 FINDINGS: Stable cardiopericardial silhouette. Prominence of central vasculature. No edema. No pneumothorax, effusion or consolidation. The inferior costophrenic angles are clipped off the edge of the film limiting evaluation. Films are under penetrated.  Overlapping cardiac leads. Asymmetric nodular density at the left lung apex overlying the anterior aspect of the left first rib. Please correlate with CT scan. IMPRESSION: Stable prominent central vasculature. Asymmetric density at the left lung apex overlying the anterior aspect of the left first rib. Please correlate with dedicated CT imaging when appropriate Electronically Signed   By: Karen Kays M.D.   On: 04/03/2023 10:07    Scheduled Meds:  atorvastatin  40 mg Oral QHS   furosemide  40 mg Intravenous Q12H   insulin aspart  0-15 Units Subcutaneous TID WC   losartan  100 mg Oral Daily   metoprolol tartrate  75 mg Oral BID   pantoprazole  40 mg Oral QPM   potassium chloride  30 mEq Oral BID   sodium chloride flush  3 mL Intravenous Q12H   warfarin  7.5 mg Oral ONCE-1600   Warfarin - Pharmacist Dosing Inpatient   Does not apply q1600   Continuous Infusions:     LOS: 2 days   Total time spent interpreting labs and vitals, coordinating care amongst consultants and care team members, directly assessing and discussing care with the patient and/or family: 55 min  Debarah Crape, DO [facility]  To contact the attending physician between 7A-7P please use Epic Chat. To contact the covering physician during after hours 7P-7A, please review Amion.   04/04/2023, 3:36 PM   *This document has been created with the assistance of dictation software. Please excuse typographical errors. *

## 2023-04-04 NOTE — Progress Notes (Signed)
 Heart Failure Nurse Navigator Progress Note  PCP: Catalina Lunger, DO PCP-Cardiologist: Marjo Bicker, MD Admission Diagnosis: Acute Congestive Heart Failure, unspecified heart failure type [facility]) Atrial fibrillation, unspecified type [facility])  Admitted from: Home  Presentation:   Miguel Cox presented with shortness of breath for a few days, bilateral leg swelling. He had just started a diuretic that was minimally helping.  He also complained of a nonproductive cough and trouble sleeping flat at night. BNP 244.0.  Heart Failure Navigator called patient in his room remotely from [facility]..  Two patient identifiers used with name and DOB prior to interview and Heart Failure education.  Patients nurse provided him with Heart Failure Education folder "Living Better With Heart Failure" before conversation took place. Patient had already read it before our conversation.  HF teaching completed and patient understood all information. Presented.  ECHO/ LVEF: 55%  Clinical Course:  Past Medical History:  Diagnosis Date   Atrial fibrillation (HCC)    Diagnosed 2020   Diverticula of intestine    DJD (degenerative joint disease)    Dysrhythmia    Essential hypertension    Gout    OSA (obstructive sleep apnea)    Intolerant of CPAP   Osteoarthritis    Polio    Type 2 diabetes mellitus (HCC)      Social History   Socioeconomic History   Marital status: Married    Spouse name: Not on file   Number of children: Not on file   Years of education: Not on file   Highest education level: Not on file  Occupational History   Not on file  Tobacco Use   Smoking status: Never   Smokeless tobacco: Never  Vaping Use   Vaping status: Never Used  Substance and Sexual Activity   Alcohol use: Not Currently   Drug use: No   Sexual activity: Not on file  Other Topics Concern   Not on file  Social History Narrative   Retired Naval architect   Social Drivers of Health    Financial Resource Strain: Low Risk  (06/14/2022)   [facility]   Overall Financial Resource Strain (CARDIA)    Difficulty of Paying Living Expenses: Not hard at all  Food Insecurity: No Food Insecurity (04/02/2023)   Hunger Vital Sign    Worried About Running Out of Food in the Last Year: Never true    Ran Out of Food in the Last Year: Never true  Transportation Needs: No Transportation Needs (04/02/2023)   PRAPARE - Administrator, Civil Service (Medical): No    Lack of Transportation (Non-Medical): No  Physical Activity: Insufficiently Active (03/08/2023)   Received from [facility]   Exercise Vital Sign    Days of Exercise per Week: 2 days    Minutes of Exercise per Session: 30 min  Stress: No Stress Concern Present (03/08/2023)   Received from [facility] - Occupational Stress Questionnaire    Feeling of Stress : Not at all  Social Connections: Patient Declined (04/02/2023)   Social Connection and Isolation Panel [NHANES]    Frequency of Communication with Friends and Family: Patient declined    Frequency of Social Gatherings with Friends and Family: Patient declined    Attends Religious Services: Patient declined    Active Member of Clubs or Organizations: Patient declined    Attends Banker Meetings: Patient declined  Marital Status: Patient declined   Education Assessment and Provision:  Detailed education and instructions provided on heart failure disease management including the following:  Signs and symptoms of Heart Failure When to call the physician Importance of daily weights Low sodium diet Fluid restriction Medication management Anticipated future follow-up appointments  Patient education given on each of the above topics.  Patient acknowledges understanding via teach back method and acceptance of all instructions.  Education Materials:  "Living Better  With Heart Failure" Booklet, HF zone tool, & Daily Weight Tracker Tool.  Patient has scale at home: Yes.  Will start doing daily weights. Patient has pill box at home: Yes.  Takes his medications daily.    High Risk Criteria for Readmission and/or Poor Patient Outcomes: Heart failure hospital admissions (last 6 months): 0  No Show rate: 6 Difficult social situation: None Demonstrates medication adherence: Yes Primary Language: English Literacy level: Reading, Writing & Comprehension  Barriers of Care:   Daily weights-will start doing them daily now New Heart Failure medications New Diet & Fluid Restrictions  Considerations/Referrals:   Referral made to Heart Failure Pharmacist Stewardship: Yes Referral made to Heart Failure CSW/NCM TOC: No Referral made to Heart & Vascular TOC clinic: Yes. 04/09/23 @ 8:30 AM @ [facility] -Heart Failure Clinic  Items for Follow-up on DC/TOC: Daily Weights Diet & Fluid Restrictions New Medications Continued Heart Failure Education   Roxy Horseman, RN, BSN [facility] Heart Failure Navigator Secure Chat Only

## 2023-04-04 NOTE — Telephone Encounter (Signed)
 Pharmacy Patient Advocate Encounter  Insurance verification completed.    The patient is insured through [employer].     Ran test claim for Jardiance 10 mg  and the current 30 day co-pay is $582.46 due to deductible.  Ran test claim for Farxiga 10 mg and the current 30 day co-pay is $562.41 due to deductible.   This test claim was processed through [facility]- copay amounts may vary at other pharmacies due to pharmacy/plan contracts, or as the patient moves through the different stages of their insurance plan.

## 2023-04-04 NOTE — TOC Initial Note (Signed)
 Transition of Care [facility]) - Initial/Assessment Note    Patient Details  Name: Miguel Cox MRN: [id number] Date of Birth: March 14, 1950  Transition of [facility] CM/SW Contact:    Armanda Heritage, RN Phone Number: 04/04/2023, 11:26 AM  Clinical Narrative:                 RN CM spoke with patient regarding discharge planning.  Per progression rounds, patient is a possible dc for tomorrow with continued medical improvement.  Patient reports he plans to dc home, reports he does not want any HH services at the time of discharge, reports he feels he manages his medications well and has no follow up concerns.  No TOC needs identified at this time.  Expected Discharge Plan: Home/Self Care Barriers to Discharge: Continued Medical Work up   Patient Goals and CMS Choice Patient states their goals for this hospitalization and ongoing recovery are:: to go home when he gets better   Choice offered to / list presented to : NA      Expected Discharge Plan and Services   Discharge Planning Services: NA                               HH Arranged: NA          Prior Living Arrangements/Services     Patient language and need for interpreter reviewed:: Yes        Need for Family Participation in Patient Care: No (Comment) Care giver support system in place?: Yes (comment)   Criminal Activity/Legal Involvement Pertinent to Current Situation/Hospitalization: No - Comment as needed  Activities of Daily Living   ADL Screening (condition at time of admission) Independently performs ADLs?: Yes (appropriate for developmental age) Is the patient deaf or have difficulty hearing?: No Does the patient have difficulty seeing, even when wearing glasses/contacts?: No Does the patient have difficulty concentrating, remembering, or making decisions?: No  Permission Sought/Granted                  Emotional Assessment Appearance:: Appears stated age Attitude/Demeanor/Rapport:  Engaged   Orientation: : Oriented to Self, Oriented to Place, Oriented to  Time, Oriented to Situation   Psych Involvement: No (comment)  Admission diagnosis:  Acute heart failure (HCC) [I50.9] Patient Active Problem List   Diagnosis Date Noted   Acute heart failure (HCC) 04/02/2023   Acquired thrombophilia (HCC) 04/02/2023   Normocytic anemia 04/02/2023   Elevated brain natriuretic peptide (BNP) level 04/02/2023   Type 2 diabetes mellitus 04/02/2023   Encounter for therapeutic drug monitoring 03/12/2022   Persistent atrial fibrillation (HCC) 03/05/2022   HTN, goal below 140/90 03/05/2022   HLD (hyperlipidemia) 03/05/2022   OSA (obstructive sleep apnea) 03/05/2022   Leg swelling 03/05/2022   Dilatation of aorta (HCC) 03/05/2022   S/P total right hip arthroplasty 07/27/2021   PCP:  Catalina Lunger, DO Pharmacy:   [facility] [phone] - [place], [place] - [address] [facility] [address] [place] [address] Phone: [phone] Fax: [phone]     Social Drivers of Health (SDOH) Social History: SDOH Screenings   Food Insecurity: No Food Insecurity (04/04/2023)  Housing: Low Risk  (04/04/2023)  Transportation Needs: No Transportation Needs (04/04/2023)  Utilities: Not At Risk (04/02/2023)  Financial Resource Strain: Low Risk  (04/04/2023)  Physical Activity: Insufficiently Active (03/08/2023)   Received from [facility]  Social Connections:  Patient Declined (04/02/2023)  Stress: No Stress Concern Present (03/08/2023)   Received from [facility]  Tobacco Use: Low Risk  (04/04/2023)  Health Literacy: Low Risk  (03/08/2023)   [facility]   SDOH Interventions: Food Insecurity Interventions: Intervention Not Indicated Housing Interventions: Intervention Not Indicated Transportation Interventions: Intervention Not Indicated Financial Strain Interventions: Intervention Not Indicated   Readmission Risk Interventions     No data to  display

## 2023-04-05 DIAGNOSIS — D6869 Other thrombophilia: Secondary | ICD-10-CM | POA: Diagnosis not present

## 2023-04-05 DIAGNOSIS — M7989 Other specified soft tissue disorders: Secondary | ICD-10-CM | POA: Diagnosis not present

## 2023-04-05 DIAGNOSIS — E1159 Type 2 diabetes mellitus with other circulatory complications: Secondary | ICD-10-CM | POA: Diagnosis not present

## 2023-04-05 DIAGNOSIS — I5031 Acute diastolic (congestive) heart failure: Secondary | ICD-10-CM | POA: Diagnosis not present

## 2023-04-05 LAB — CBC
HCT: 36.6 % — ABNORMAL LOW (ref 39.0–52.0)
Hemoglobin: 11.7 g/dL — ABNORMAL LOW (ref 13.0–17.0)
MCH: 30.2 pg (ref 26.0–34.0)
MCHC: 32 g/dL (ref 30.0–36.0)
MCV: 94.3 fL (ref 80.0–100.0)
Platelets: 238 10*3/uL (ref 150–400)
RBC: 3.88 MIL/uL — ABNORMAL LOW (ref 4.22–5.81)
RDW: 15.1 % (ref 11.5–15.5)
WBC: 7.7 10*3/uL (ref 4.0–10.5)
nRBC: 0 % (ref 0.0–0.2)

## 2023-04-05 LAB — BASIC METABOLIC PANEL
Anion gap: 10 (ref 5–15)
BUN: 29 mg/dL — ABNORMAL HIGH (ref 8–23)
CO2: 27 mmol/L (ref 22–32)
Calcium: 8.6 mg/dL — ABNORMAL LOW (ref 8.9–10.3)
Chloride: 100 mmol/L (ref 98–111)
Creatinine, Ser: 1.59 mg/dL — ABNORMAL HIGH (ref 0.61–1.24)
GFR, Estimated: 45 mL/min — ABNORMAL LOW (ref >60)
Glucose, Bld: 123 mg/dL — ABNORMAL HIGH (ref 70–99)
Potassium: 4 mmol/L (ref 3.5–5.1)
Sodium: 137 mmol/L (ref 135–145)

## 2023-04-05 LAB — PROTIME-INR
INR: 2.6 — ABNORMAL HIGH (ref 0.8–1.2)
Prothrombin Time: 27.6 s — ABNORMAL HIGH (ref 11.4–15.2)

## 2023-04-05 LAB — GLUCOSE, CAPILLARY: Glucose-Capillary: 135 mg/dL — ABNORMAL HIGH (ref 70–99)

## 2023-04-05 MED ORDER — WARFARIN SODIUM 7.5 MG PO TABS: 7.5000 mg | ORAL_TABLET | Freq: Once | ORAL | Status: DC

## 2023-04-05 NOTE — Progress Notes (Signed)
 PHARMACY - ANTICOAGULATION CONSULT NOTE  Pharmacy Consult for warfarin Indication: atrial fibrillation  Allergies  Allergen Reactions   Other Swelling    Pt can't eat anything from [facility], it causes swelling   Penicillins Hives and Itching    Did it involve swelling of the face/tongue/throat, SOB, or low BP? No Did it involve sudden or severe rash/hives, skin peeling, or any reaction on the inside of your mouth or nose? No Did you need to seek medical attention at a hospital or doctor's office? No When did it last happen?       If all above answers are "NO", may proceed with cephalosporin use. Tolerated Cephalosporin Date: 07/27/21.     Cefazolin Rash    Patient Measurements: Height: 6' (182.9 cm) Weight: 124.1 kg (273 lb 9.5 oz) IBW/kg (Calculated) : 77.6  Vital Signs: Temp: 98 F (36.7 C) (03/07 0520) Temp Source: Oral (03/07 0520) BP: 128/80 (03/07 0520) Pulse Rate: 79 (03/07 0520)  Labs: Recent Labs    04/02/23 1235 04/03/23 0335 04/04/23 0425 04/05/23 0329  HGB 10.8* 11.0* 11.6* 11.7*  HCT 34.1* 34.8* 36.6* 36.6*  PLT 217 204 234 238  LABPROT 26.8* 27.0* 26.1* 27.6*  INR 2.5* 2.5* 2.4* 2.6*  CREATININE 1.19 1.29* 1.46* 1.59*  TROPONINIHS 16  --   --   --     Estimated Creatinine Clearance: 55.5 mL/min (A) (by C-G formula based on SCr of 1.59 mg/dL (H)).   Medical History: Past Medical History:  Diagnosis Date   Atrial fibrillation (HCC)    Diagnosed 2020   Diverticula of intestine    DJD (degenerative joint disease)    Dysrhythmia    Essential hypertension    Gout    OSA (obstructive sleep apnea)    Intolerant of CPAP   Osteoarthritis    Polio    Type 2 diabetes mellitus (HCC)     Medications:  See med rec  Assessment: Pt arrived from home c/o worsening SOB X 4 days. Pt reports bilateral leg swelling. Pt reports difficulty breathing worse at night.  Patient is chronically anticoagulated with warfarin for afib. Pharmacy asked to dose.    INR therapeutic today at 2.6. No bleeding issues noted. CBC stable.   Seen in anticoag clinic. Home dose is 9 mg on Monday and 7.5mg  all other days   Goal of Therapy:  INR 2-3 Monitor platelets by anticoagulation protocol: Yes   Plan:  Coumadin 7.5mg  po x 1 today PT-INR daily Monitor for S/S of bleeding  Judeth Cornfield, PharmD Clinical Pharmacist 04/05/2023 7:53 AM

## 2023-04-05 NOTE — Plan of Care (Signed)
    Problem: Education: Goal: Knowledge of General Education information will improve Description: Including pain rating scale, medication(s)/side effects and non-pharmacologic comfort measures Outcome: Adequate for Discharge   Problem: Health Behavior/Discharge Planning: Goal: Ability to manage health-related needs will improve Outcome: Adequate for Discharge   Problem: Activity: Goal: Risk for activity intolerance will decrease Outcome: Adequate for Discharge   

## 2023-04-05 NOTE — Care Management Important Message (Signed)
 Important Message  Patient Details  Name: Miguel Cox MRN: [id number] Date of Birth: 1950/09/13   Important Message Given:  Yes - Medicare IM     Corey Harold 04/05/2023, 9:56 AM

## 2023-04-05 NOTE — Discharge Summary (Signed)
 Physician Discharge Summary   Patient: Miguel Cox MRN: [id number] DOB: 1950/10/03  Admit date:     04/02/2023  Discharge date: 04/05/23  Discharge Physician: Debarah Crape   PCP: Catalina Lunger, DO   Recommendations at discharge:   Follow up with PCP within 5 days for recheck kidney function  Discharge Diagnoses: Principal Problem:   Acute heart failure (HCC) Active Problems:   Acquired thrombophilia (HCC)   Persistent atrial fibrillation (HCC)   HTN, goal below 140/90   HLD (hyperlipidemia)   OSA (obstructive sleep apnea)   Leg swelling   Dilatation of aorta (HCC)   Normocytic anemia   Elevated brain natriuretic peptide (BNP) level   Type 2 diabetes mellitus  Resolved Problems:   * Miguel Cox resolved hospital problems. [facility]: Mr. Miguel Cox is a 73 year old male with hypertension, hyperlipidemia, diastolic heart failure, type 2 diabetes, atrial fibrillation on warfarin, chronic dilation of aorta, who presented to the ED complaining of worsening dyspnea, lower extremity edema, and orthopnea.  In the ED he was found to have BNP elevation to 244, and profound lower extremity edema.  Pulmonary edema was also seen on chest x-ray and patient demonstrated elevated blood pressures.  He was admitted for heart failure exacerbation.   He had significant improvement with IV diuresis and put out over 6 L during the course of his admission.  Repeat echocardiogram revealed LVEF 55%, left ventricle with mild dilation and diastolic parameters were indeterminate.  Right ventricular function is moderately reduced and ventricular size is enlarged.  Patient was educated on the importance of a low-sodium diet and GDMT was titrated as blood pressure could tolerate.  Pharmacy was consulted to evaluate for Farxiga patient but this is cost prohibitive up to the patient.  By reevaluation on 3/7 patient's edema had improved significantly and he endorsed feeling back to his physiologic baseline  and ready to discharge home.   His stay was complicated by AKI as a result of his diuresis.  We discussed continued hospitalization to monitor his kidney function but patient has requested to discharge home with outpatient follow-up.  After shared decision making, we will proceed with discharge today and he will follow-up with his primary care physician early next week to check kidney function.  He was given strict ER return precautions. While admitted we performed hemoglobin A1c which revealed 5.7%.  Patient is taking glipizide and metformin outpatient.  While he may benefit from continued metformin, we will hold this medication currently given his kidney function.  He may Miguel Cox longer require glipizide.  We have requested he follow-up with his primary care physician for continued diabetes management.   Acute heart failure preserved EF - BNP 244 on arrival with severe lower extremity edema - Echocardiogram LVEF 55%, left ventricle internal cavity mildly dilated.  Diastolic parameters are indeterminate.  Right ventricular function is moderately reduced.  Right ventricular size mildly enlarged. - Adequately diuresed now, output 6 L throughout admission.  Hold further Lasix given kidney function - GDMT as BP tolerates (Farxiga will be cost prohibitive) - Optimize electrolytes   Bilateral lower extremity edema - Secondary to heart failure as above - Additionally was taking amlodipine outpatient, will discontinue this in favor of alternative antihypertensives to avoid further complicating lower extremity edema picture   AKI - Baseline creatinine ~1.0, up to 1.59. -- Acute worsening likely 2/2 to diuresis. Hold further lasix. -- renally dose with CrCl 55 when needed -- discussed continued admission for renal function improvement, pt has  requested to DC home and will follow with PCP.    On anticoagulation - Daily warfarin - INR therapeutic on arrival - Continue home dose at DC    Chronic A-fib -  Continue home dose metoprolol and warfarin as above - Echocardiogram as above   Normocytic anemia - Anemia panel: low TSat, Iron WNL. - Trend CBC   Type 2 diabetes - Hemoglobin A1c: 5.7%.  Continue with just daily glucose checks for now.  Miguel Cox indication for sliding scale insulin at this time. -- hold glipizide at DC. F/u PCP   Hypertension - Hold amlodipine as above. BP currently at goal. - Cont losartan   OSA - CPAP at night   Dilation of aorta - Patient is planned for CT angio outpatient, being monitored by cardiology.  Continue close outpatient follow-up    Consultants: n/a Procedures performed: n/a  Disposition: Home Diet recommendation:  Discharge Diet Orders (From admission, onward)     Start     Ordered   04/05/23 0000  Diet general        04/05/23 0945           Cardiac diet DISCHARGE MEDICATION: Allergies as of 04/05/2023       Reactions   Other Swelling   Pt can't eat anything from [facility], it causes swelling   Penicillins Hives, Itching   Did it involve swelling of the face/tongue/throat, SOB, or low BP? Miguel Cox Did it involve sudden or severe rash/hives, skin peeling, or any reaction on the inside of your mouth or nose? Miguel Cox Did you need to seek medical attention at a hospital or doctor's office? Miguel Cox When did it last happen?       If all above answers are "Miguel Cox", may proceed with cephalosporin use. Tolerated Cephalosporin Date: 07/27/21.   Cefazolin Rash        Medication List     PAUSE taking these medications    glipiZIDE 10 MG 24 hr tablet Wait to take this until your doctor or other care provider tells you to start again. Commonly known as: GLUCOTROL XL Take 10 mg by mouth daily.   metFORMIN 500 MG 24 hr tablet Wait to take this until your doctor or other care provider tells you to start again. Commonly known as: GLUCOPHAGE-XR Take 500 mg by mouth 2 (two) times daily.       TAKE these medications    atorvastatin 40 MG tablet Commonly  known as: LIPITOR Take 40 mg by mouth at bedtime.   colchicine 0.6 MG tablet Take 0.6 mg by mouth daily as needed (gout). Take 2 tablets and one hour later take a third tablet as needed for gout   EPINEPHrine 0.3 mg/0.3 mL Soaj injection Commonly known as: EpiPen 2-Pak Inject 0.3 mLs (0.3 mg total) into the muscle once.   Krill Oil 1000 MG Caps Take 1,000 mg by mouth daily.   losartan 50 MG tablet Commonly known as: COZAAR TAKE 1 TABLET BY MOUTH EVERY DAY What changed: how much to take   Metoprolol Tartrate 75 MG Tabs Take 1 tablet by mouth 2 (two) times daily.   multivitamin with minerals Tabs tablet Take 1 tablet by mouth daily.   sildenafil 100 MG tablet Commonly known as: VIAGRA Take 1 tablet by mouth daily as needed.   VITAMIN D3 PO Take 1 tablet by mouth daily.   warfarin 3 MG tablet Commonly known as: COUMADIN Take as directed. If you are unsure how to take this medication, talk to  your nurse or doctor. Original instructions: TAKE 2 AND 1/2 TABLETS DAILY EXCEPT 3 TABLETS ON MONDAYS OR AS DIRECTED BY COUMADIN CLINIC        Follow-up Information     [facility]. Go on 04/09/2023.   Specialty: Cardiology Why: Hospital Follow-Up 04/09/23 @ 8:30 AM Please Bring all medications to follow-up appointment [facility], [address], Second floor Free Valet Parking at the [employer] information: [address] [place] [address] [phone]               Discharge Exam: Filed Weights   04/03/23 0500 04/04/23 0451 04/05/23 0448  Weight: 127.9 kg 125.8 kg 124.1 kg   Constitutional:  Normal appearance. Non toxic-appearing.  HENT: Head Normocephalic and atraumatic.  Mucous membranes are moist.  Eyes:  Extraocular intact. Conjunctivae normal. Pupils are equal, round, and reactive to light.  Cardiovascular: Rate and Rhythm: Normal rate and regular rhythm. Trace pedal  edema. Pulmonary: Non labored, symmetric rise of chest wall.  Musculoskeletal:  Normal range of motion.  Skin: warm and dry. Chronic venous stasis erythema bilateral calves Neurological: Miguel Cox focal deficit present. alert. Oriented. Psychiatric: Mood and Affect congruent.    Condition at discharge: stable  The results of significant diagnostics from this hospitalization (including imaging, microbiology, ancillary and laboratory) are listed below for reference.   Imaging Studies: ECHOCARDIOGRAM COMPLETE Result Date: 04/03/2023    ECHOCARDIOGRAM REPORT   Patient Name:   TREQUAN MARSOLEK Date of Exam: 04/03/2023 Medical Rec #:  [id number]       Height:       72.0 in Accession #:    [id number]      Weight:       282.0 lb Date of Birth:  08-06-1950      BSA:          2.465 m Patient Age:    72 years        BP:           148/71 mmHg Patient Gender: M               HR:           54 bpm. [facility] Procedure: 2D Echo, Color Doppler, Cardiac Doppler and Intracardiac            Opacification Agent (Both Spectral and Color Flow Doppler were            utilized during procedure). Indications:    Congestive Heart Failure I50.9  History:        Patient has prior history of Echocardiogram examinations, most                 recent 08/31/2019. CHF; Risk Factors:Hypertension, Diabetes and                 HLD.  Sonographer:    Webb Laws Referring Phys: [id number] CLANFORD L JOHNSON IMPRESSIONS  1. Poor acoustic windows limit study, even with Defininty RV difficult to see.  2. Left ventricular ejection fraction, by estimation, is 55%. The left ventricular internal cavity size was mildly dilated. Left ventricular diastolic parameters are indeterminate.  3. Right ventricular systolic function is moderately reduced. The right ventricular size is mildly enlarged.  4. The mitral valve is normal in structure. Trivial mitral valve regurgitation.  5. The aortic valve is tricuspid. Aortic valve regurgitation is trivial.  Aortic valve sclerosis/calcification is present, without any evidence of  aortic stenosis.  6. The inferior vena cava is dilated in size with >50% respiratory variability, suggesting right atrial pressure of 8 mmHg. FINDINGS  Left Ventricle: Left ventricular ejection fraction, by estimation, is 55%. Definity contrast agent was given IV to delineate the left ventricular endocardial borders. The left ventricular internal cavity size was mildly dilated. There is Miguel Cox left ventricular hypertrophy. Left ventricular diastolic parameters are indeterminate. Right Ventricle: The right ventricular size is mildly enlarged. Right vetricular wall thickness was not assessed. Right ventricular systolic function is moderately reduced. Left Atrium: Left atrial size was normal in size. Right Atrium: Right atrial size was normal in size. Pericardium: There is Miguel Cox evidence of pericardial effusion. Mitral Valve: The mitral valve is normal in structure. Mild mitral annular calcification. Trivial mitral valve regurgitation. Tricuspid Valve: The tricuspid valve is normal in structure. Tricuspid valve regurgitation is trivial. Aortic Valve: The aortic valve is tricuspid. Aortic valve regurgitation is trivial. Aortic valve sclerosis/calcification is present, without any evidence of aortic stenosis. Pulmonic Valve: The pulmonic valve was not well visualized. Pulmonic valve regurgitation is not visualized. Miguel Cox evidence of pulmonic stenosis. Aorta: The aortic root and ascending aorta are structurally normal, with Miguel Cox evidence of dilitation. Venous: The inferior vena cava is dilated in size with greater than 50% respiratory variability, suggesting right atrial pressure of 8 mmHg. IAS/Shunts: Miguel Cox atrial level shunt detected by color flow Doppler.  LEFT VENTRICLE PLAX 2D LVIDd:         5.90 cm      Diastology LVIDs:         2.30 cm      LV e' medial:    8.92 cm/s LV PW:         1.10 cm      LV E/e' medial:  12.1 LV IVS:        1.00 cm      LV e' lateral:    9.79 cm/s LVOT diam:     2.00 cm      LV E/e' lateral: 11.0 LV SV:         53 LV SV Index:   22 LVOT Area:     3.14 cm  LV Volumes (MOD) LV vol d, MOD A2C: 131.0 ml LV vol d, MOD A4C: 137.0 ml LV vol s, MOD A2C: 71.8 ml LV vol s, MOD A4C: 47.0 ml LV SV MOD A2C:     59.2 ml LV SV MOD A4C:     137.0 ml LV SV MOD BP:      74.3 ml RIGHT VENTRICLE            IVC RV Basal diam:  5.00 cm    IVC diam: 2.70 cm RV Mid diam:    4.50 cm RV S prime:     7.72 cm/s TAPSE (M-mode): 2.2 cm LEFT ATRIUM             Index        RIGHT ATRIUM           Index LA diam:        4.50 cm 1.83 cm/m   RA Area:     20.60 cm LA Vol (A2C):   46.1 ml 18.70 ml/m  RA Volume:   57.00 ml  23.12 ml/m LA Vol (A4C):   52.5 ml 21.29 ml/m LA Biplane Vol: 49.6 ml 20.12 ml/m  AORTIC VALVE LVOT Vmax:   80.30 cm/s LVOT Vmean:  60.900 cm/s LVOT VTI:    0.169 m  AORTA  Ao Root diam: 3.70 cm Ao Asc diam:  4.00 cm MITRAL VALVE MV Area (PHT): 3.53 cm     SHUNTS MV Decel Time: 215 msec     Systemic VTI:  0.17 m MV E velocity: 108.00 cm/s  Systemic Diam: 2.00 cm Dietrich Pates MD Electronically signed by Dietrich Pates MD Signature Date/Time: 04/03/2023/3:16:58 PM    Final    DG CHEST PORT 1 VIEW Result Date: 04/03/2023 CLINICAL DATA:  Chest pain EXAM: PORTABLE CHEST 1 VIEW COMPARISON:  X-ray 04/02/2023 FINDINGS: Stable cardiopericardial silhouette. Prominence of central vasculature. Miguel Cox edema. Miguel Cox pneumothorax, effusion or consolidation. The inferior costophrenic angles are clipped off the edge of the film limiting evaluation. Films are under penetrated. Overlapping cardiac leads. Asymmetric nodular density at the left lung apex overlying the anterior aspect of the left first rib. Please correlate with CT scan. IMPRESSION: Stable prominent central vasculature. Asymmetric density at the left lung apex overlying the anterior aspect of the left first rib. Please correlate with dedicated CT imaging when appropriate Electronically Signed   By: Karen Kays M.D.   On:  04/03/2023 10:07   DG Chest Port 1 View Result Date: 04/02/2023 CLINICAL DATA:  Shortness of breath. EXAM: PORTABLE CHEST 1 VIEW COMPARISON:  December 01, 2018. FINDINGS: Stable cardiomediastinal silhouette. Mild central pulmonary vascular congestion is noted and minimal pulmonary edema cannot be excluded. Bony thorax is unremarkable. IMPRESSION: Mild central pulmonary vascular congestion is noted with possible minimal pulmonary edema. Electronically Signed   By: Lupita Raider M.D.   On: 04/02/2023 13:20    Microbiology: Results for orders placed or performed during the hospital encounter of 04/02/23  Resp panel by RT-PCR (RSV, Flu A&B, Covid) Anterior Nasal Swab     Status: None   Collection Time: 04/02/23 12:47 PM   Specimen: Anterior Nasal Swab  Result Value Ref Range Status   SARS Coronavirus 2 by RT PCR NEGATIVE NEGATIVE Final    Comment: (NOTE) SARS-CoV-2 target nucleic acids are NOT DETECTED.  The SARS-CoV-2 RNA is generally detectable in upper respiratory specimens during the acute phase of infection. The lowest concentration of SARS-CoV-2 viral copies this assay can detect is 138 copies/mL. A negative result does not preclude SARS-Cov-2 infection and should not be used as the sole basis for treatment or other patient management decisions. A negative result may occur with  improper specimen collection/handling, submission of specimen other than nasopharyngeal swab, presence of viral mutation(s) within the areas targeted by this assay, and inadequate number of viral copies(<138 copies/mL). A negative result must be combined with clinical observations, patient history, and epidemiological information. The expected result is Negative.  Fact Sheet for Patients:  BloggerCourse.com  Fact Sheet for Healthcare Providers:  SeriousBroker.it  This test is Miguel Cox t yet approved or cleared by the [place] FDA and  has been authorized  for detection and/or diagnosis of SARS-CoV-2 by FDA under an Emergency Use Authorization (EUA). This EUA will remain  in effect (meaning this test can be used) for the duration of the COVID-19 declaration under Section 564(b)(1) of the Act, 21 U.S.C.section 360bbb-3(b)(1), unless the authorization is terminated  or revoked sooner.       Influenza A by PCR NEGATIVE NEGATIVE Final   Influenza B by PCR NEGATIVE NEGATIVE Final    Comment: (NOTE) The Xpert Xpress SARS-CoV-2/FLU/RSV plus assay is intended as an aid in the diagnosis of influenza from Nasopharyngeal swab specimens and should not be used as a sole basis for treatment. Nasal washings  and aspirates are unacceptable for Xpert Xpress SARS-CoV-2/FLU/RSV testing.  Fact Sheet for Patients: BloggerCourse.com  Fact Sheet for Healthcare Providers: SeriousBroker.it  This test is not yet approved or cleared by the [place] FDA and has been authorized for detection and/or diagnosis of SARS-CoV-2 by FDA under an Emergency Use Authorization (EUA). This EUA will remain in effect (meaning this test can be used) for the duration of the COVID-19 declaration under Section 564(b)(1) of the Act, 21 U.S.C. section 360bbb-3(b)(1), unless the authorization is terminated or revoked.     Resp Syncytial Virus by PCR NEGATIVE NEGATIVE Final    Comment: (NOTE) Fact Sheet for Patients: BloggerCourse.com  Fact Sheet for Healthcare Providers: SeriousBroker.it  This test is not yet approved or cleared by the [place] FDA and has been authorized for detection and/or diagnosis of SARS-CoV-2 by FDA under an Emergency Use Authorization (EUA). This EUA will remain in effect (meaning this test can be used) for the duration of the COVID-19 declaration under Section 564(b)(1) of the Act, 21 U.S.C. section 360bbb-3(b)(1), unless the authorization is  terminated or revoked.  Performed at [facility], [address], [place], [place] [address]     Labs: CBC: Recent Labs  Lab 04/02/23 1235 04/03/23 0335 04/04/23 0425 04/05/23 0329  WBC 7.8 8.3 7.9 7.7  NEUTROABS 6.0  --   --   --   HGB 10.8* 11.0* 11.6* 11.7*  HCT 34.1* 34.8* 36.6* 36.6*  MCV 95.3 95.3 93.8 94.3  PLT 217 204 234 238   Basic Metabolic Panel: Recent Labs  Lab 04/02/23 1235 04/03/23 0335 04/04/23 0425 04/05/23 0329  NA 138 137 135 137  K 3.9 3.8 3.7 4.0  CL 102 102 100 100  CO2 27 29 28 27   GLUCOSE 95 82 119* 123*  BUN 19 19 23  29*  CREATININE 1.19 1.29* 1.46* 1.59*  CALCIUM 8.9 8.8* 8.8* 8.6*  MG 1.8 1.9  --   --    Liver Function Tests: Recent Labs  Lab 04/02/23 1235  AST 22  ALT 21  ALKPHOS 60  BILITOT 1.0  PROT 6.7  ALBUMIN 3.5   CBG: Recent Labs  Lab 04/04/23 0733 04/04/23 1157 04/04/23 1615 04/04/23 1955 04/05/23 0744  GLUCAP 127* 112* 150* 138* 135*    Discharge time spent:  33 minutes.  Signed: Debarah Crape, DO [facility] 04/05/2023

## 2023-04-08 ENCOUNTER — Telehealth: Payer: Self-pay | Admitting: Family

## 2023-04-08 NOTE — Progress Notes (Unsigned)
 Advanced Heart Failure Clinic Note   Referring Physician: recent admission PCP: Catalina Lunger, DO (last seen 02/25) Cardiologist: Marjo Bicker, MD (last seen 01/25)  Chief Complaint:   HPI:  Mr Neely is a 73 y/o male with a history of persistent A-fib, hypertension, type 2 diabetes, OSA on CPAP, acquired thrombocytopenia, anemia, dilatation of aorta and chronic heart failure.   Echo 08/31/19: EF 60-65% with moderate LVH, severe LAE, RV normal, trivial MR, mild dilatation of the aortic root measuring 42 mm.   Chest CTA angio 03/27/23: 4.0 cm fusiform dilatation of the ascending thoracic aorta. Continue annual imaging followup by CTA or MRA  Admitted to [facility] 04/02/23 with worsening dyspnea, lower extremity edema, and orthopnea. Found to have elevated BNP and profound edema. Pulmonary edema seen on CXR and he was admitted with HF exacerbation. IV diuresed and put out >6L of fluids. Repeat echocardiogram revealed LVEF 55%, left ventricle with mild dilation and diastolic parameters were indeterminate. Right ventricular function is moderately reduced and ventricular size is enlarged. Symptoms improved. Did develop AKI due to diuresis. Lasix held due to kidney function.   Echo 04/03/23: EF 55%, moderately reduced RV, trivial MR/ AR  He presents today for his initial HF visit with a chief complaint of    Review of Systems: [y] = yes, [ ]  = no   General: Weight gain [ ] ; Weight loss [ ] ; Anorexia [ ] ; Fatigue [ ] ; Fever [ ] ; Chills [ ] ; Weakness [ ]   Cardiac: Chest pain/pressure [ ] ; Resting SOB [ ] ; Exertional SOB [ ] ; Orthopnea [ ] ; Pedal Edema [ ] ; Palpitations [ ] ; Syncope [ ] ; Presyncope [ ] ; Paroxysmal nocturnal dyspnea[ ]   Pulmonary: Cough [ ] ; Wheezing[ ] ; Hemoptysis[ ] ; Sputum [ ] ; Snoring [ ]   GI: Vomiting[ ] ; Dysphagia[ ] ; Melena[ ] ; Hematochezia [ ] ; Heartburn[ ] ; Abdominal pain [ ] ; Constipation [ ] ; Diarrhea [ ] ; BRBPR [ ]   GU: Hematuria[ ] ; Dysuria [ ] ; Nocturia[  ]  Vascular: Pain in legs with walking [ ] ; Pain in feet with lying flat [ ] ; Non-healing sores [ ] ; Stroke [ ] ; TIA [ ] ; Slurred speech [ ] ;  Neuro: Headaches[ ] ; Vertigo[ ] ; Seizures[ ] ; Paresthesias[ ] ;Blurred vision [ ] ; Diplopia [ ] ; Vision changes [ ]   Ortho/Skin: Arthritis [ ] ; Joint pain [ ] ; Muscle pain [ ] ; Joint swelling [ ] ; Back Pain [ ] ; Rash [ ]   Psych: Depression[ ] ; Anxiety[ ]   Heme: Bleeding problems [ ] ; Clotting disorders [ ] ; Anemia [ ]   Endocrine: Diabetes [ ] ; Thyroid dysfunction[ ]    Past Medical History:  Diagnosis Date   Atrial fibrillation (HCC)    Diagnosed 2020   Diverticula of intestine    DJD (degenerative joint disease)    Dysrhythmia    Essential hypertension    Gout    OSA (obstructive sleep apnea)    Intolerant of CPAP   Osteoarthritis    Polio    Type 2 diabetes mellitus (HCC)     Current Outpatient Medications  Medication Sig Dispense Refill   atorvastatin (LIPITOR) 40 MG tablet Take 40 mg by mouth at bedtime.  3   Cholecalciferol (VITAMIN D3 PO) Take 1 tablet by mouth daily.     colchicine 0.6 MG tablet Take 0.6 mg by mouth daily as needed (gout). Take 2 tablets and one hour later take a third tablet as needed for gout     EPINEPHrine (EPIPEN 2-PAK) 0.3 mg/0.3 mL IJ SOAJ  injection Inject 0.3 mLs (0.3 mg total) into the muscle once. 1 Device 1   [Paused] glipiZIDE (GLUCOTROL XL) 10 MG 24 hr tablet Take 10 mg by mouth daily.     Krill Oil 1000 MG CAPS Take 1,000 mg by mouth daily.      losartan (COZAAR) 50 MG tablet TAKE 1 TABLET BY MOUTH EVERY DAY (Patient taking differently: Take 100 mg by mouth daily.) 90 tablet 1   [Paused] metFORMIN (GLUCOPHAGE-XR) 500 MG 24 hr tablet Take 500 mg by mouth 2 (two) times daily.     Metoprolol Tartrate 75 MG TABS Take 1 tablet by mouth 2 (two) times daily.     Multiple Vitamin (MULTIVITAMIN WITH MINERALS) TABS tablet Take 1 tablet by mouth daily.     sildenafil (VIAGRA) 100 MG tablet Take 1 tablet by mouth  daily as needed.     warfarin (COUMADIN) 3 MG tablet TAKE 2 AND 1/2 TABLETS DAILY EXCEPT 3 TABLETS ON MONDAYS OR AS DIRECTED BY COUMADIN CLINIC 230 tablet 1   No current facility-administered medications for this visit.    Allergies  Allergen Reactions   Other Swelling    Pt can't eat anything from [facility], it causes swelling   Penicillins Hives and Itching    Did it involve swelling of the face/tongue/throat, SOB, or low BP? No Did it involve sudden or severe rash/hives, skin peeling, or any reaction on the inside of your mouth or nose? No Did you need to seek medical attention at a hospital or doctor's office? No When did it last happen?       If all above answers are "NO", may proceed with cephalosporin use. Tolerated Cephalosporin Date: 07/27/21.     Cefazolin Rash      Social History   Socioeconomic History   Marital status: Married    Spouse name: Not on file   Number of children: Not on file   Years of education: Not on file   Highest education level: 10th grade  Occupational History   Not on file  Tobacco Use   Smoking status: Never   Smokeless tobacco: Never  Vaping Use   Vaping status: Never Used  Substance and Sexual Activity   Alcohol use: Not Currently   Drug use: No   Sexual activity: Not on file  Other Topics Concern   Not on file  Social History Narrative   Retired Naval architect   Social Drivers of Corporate investment banker Strain: Low Risk  (04/04/2023)   Overall Financial Resource Strain (CARDIA)    Difficulty of Paying Living Expenses: Not hard at all  Food Insecurity: No Food Insecurity (04/04/2023)   Hunger Vital Sign    Worried About Running Out of Food in the Last Year: Never true    Ran Out of Food in the Last Year: Never true  Transportation Needs: No Transportation Needs (04/04/2023)   PRAPARE - Administrator, Civil Service (Medical): No    Lack of Transportation (Non-Medical): No  Physical Activity: Insufficiently Active  (03/08/2023)   Received from [facility]   Exercise Vital Sign    Days of Exercise per Week: 2 days    Minutes of Exercise per Session: 30 min  Stress: No Stress Concern Present (03/08/2023)   Received from [facility] - Occupational Stress Questionnaire    Feeling of Stress : Not at all  Social Connections: Patient Declined (04/02/2023)   Social  Connection and Isolation Panel [NHANES]    Frequency of Communication with Friends and Family: Patient declined    Frequency of Social Gatherings with Friends and Family: Patient declined    Attends Religious Services: Patient declined    Database administrator or Organizations: Patient declined    Attends Banker Meetings: Patient declined    Marital Status: Patient declined  Intimate Partner Violence: Not At Risk (04/02/2023)   Humiliation, Afraid, Rape, and Kick questionnaire    Fear of Current or Ex-Partner: No    Emotionally Abused: No    Physically Abused: No    Sexually Abused: No      Family History  Problem Relation Age of Onset   Cancer Mother    Heart attack Brother 56       PHYSICAL EXAM: General:  Well appearing. No respiratory difficulty HEENT: normal Neck: supple. no JVD. Carotids 2+ bilat; no bruits. No lymphadenopathy or thyromegaly appreciated. Cor: PMI nondisplaced. Regular rate & rhythm. No rubs, gallops or murmurs. Lungs: clear Abdomen: soft, nontender, nondistended. No hepatosplenomegaly. No bruits or masses. Good bowel sounds. Extremities: no cyanosis, clubbing, rash, edema Neuro: alert & oriented x 3, cranial nerves grossly intact. moves all 4 extremities w/o difficulty. Affect pleasant.  ECG:   ASSESSMENT & PLAN:  1: Chronic heart failure with preserved ejection fraction- - suspect due to  - NYHA class - euvolemic - weighing daily - Echo 08/31/19: EF 60-65% with moderate LVH, severe LAE, RV normal, trivial MR, mild dilatation of the aortic  root measuring 42 mm.  - Echo 04/03/23: EF 55%, moderately reduced RV, trivial MR/ AR - continue  - BNP 04/02/23 reviewed and was 244.0  2: HTN- - BP - saw PCP ([facility]) 02/25 - BMET 04/05/23 reviewed and showed sodium 137, potassium 4.0, creatinine 1.59 & GFR 45  3: T2DM- - A1c 04/02/23 reviewed and was 5.7%  4: Persistent atrial fibrillation- -  - saw cardiology in [facility] 01/25  5: OSA- - wearing CPAP nightly  6: Aortic root dilatation- - Chest CTA angio 03/27/23: 4.0 cm fusiform dilatation of the ascending thoracic aorta. - yearly follow-up    Delma Freeze, FNP 04/08/23

## 2023-04-08 NOTE — Telephone Encounter (Signed)
 Pt confirmed appt for 04/09/23

## 2023-04-09 ENCOUNTER — Encounter: Payer: Self-pay | Admitting: Family

## 2023-04-09 ENCOUNTER — Other Ambulatory Visit (HOSPITAL_COMMUNITY): Payer: Self-pay

## 2023-04-09 ENCOUNTER — Ambulatory Visit: Admitting: Family

## 2023-04-09 VITALS — BP 159/80 | HR 67 | Wt 287.0 lb

## 2023-04-09 DIAGNOSIS — I10 Essential (primary) hypertension: Secondary | ICD-10-CM

## 2023-04-09 DIAGNOSIS — D696 Thrombocytopenia, unspecified: Secondary | ICD-10-CM | POA: Diagnosis not present

## 2023-04-09 DIAGNOSIS — E1122 Type 2 diabetes mellitus with diabetic chronic kidney disease: Secondary | ICD-10-CM

## 2023-04-09 DIAGNOSIS — E119 Type 2 diabetes mellitus without complications: Secondary | ICD-10-CM | POA: Diagnosis not present

## 2023-04-09 DIAGNOSIS — E785 Hyperlipidemia, unspecified: Secondary | ICD-10-CM | POA: Diagnosis not present

## 2023-04-09 DIAGNOSIS — I77810 Thoracic aortic ectasia: Secondary | ICD-10-CM

## 2023-04-09 DIAGNOSIS — Z7984 Long term (current) use of oral hypoglycemic drugs: Secondary | ICD-10-CM | POA: Diagnosis not present

## 2023-04-09 DIAGNOSIS — I4819 Other persistent atrial fibrillation: Secondary | ICD-10-CM

## 2023-04-09 DIAGNOSIS — I110 Hypertensive heart disease with heart failure: Secondary | ICD-10-CM | POA: Insufficient documentation

## 2023-04-09 DIAGNOSIS — D649 Anemia, unspecified: Secondary | ICD-10-CM | POA: Diagnosis not present

## 2023-04-09 DIAGNOSIS — N1831 Chronic kidney disease, stage 3a: Secondary | ICD-10-CM

## 2023-04-09 DIAGNOSIS — Z79899 Other long term (current) drug therapy: Secondary | ICD-10-CM | POA: Diagnosis not present

## 2023-04-09 DIAGNOSIS — I5032 Chronic diastolic (congestive) heart failure: Secondary | ICD-10-CM | POA: Diagnosis not present

## 2023-04-09 DIAGNOSIS — R0602 Shortness of breath: Secondary | ICD-10-CM | POA: Diagnosis present

## 2023-04-09 MED ORDER — EMPAGLIFLOZIN 10 MG PO TABS: 10.0000 mg | ORAL_TABLET | Freq: Every day | ORAL | 6 refills | Status: DC

## 2023-04-09 NOTE — Patient Instructions (Signed)
 Medication Changes:  START Jardiance 10mg  (1 tab) daily   Special Instructions // Education:  Do the following things EVERYDAY: Weigh yourself in the morning before breakfast. Write it down and keep it in a log. Take your medicines as prescribed Eat low salt foods--Limit salt (sodium) to 2000 mg per day.  Stay as active as you can everyday Limit all fluids for the day to less than 2 liters   Follow-Up in: Please follow up with the Advanced Heart Failure Clinic in 3 weeks.  At the Advanced Heart Failure Clinic, you and your health needs are our priority. We have a designated team specialized in the treatment of Heart Failure. This Care Team includes your primary Heart Failure Specialized Cardiologist (physician), Advanced Practice Providers (APPs- Physician Assistants and Nurse Practitioners), and Pharmacist who all work together to provide you with the care you need, when you need it.   You may see any of the following providers on your designated Care Team at your next follow up:  Dr. Arvilla Meres Dr. Marca Ancona Dr. Dorthula Nettles Dr. Theresia Bough Clarisa Kindred, NP Tonye Becket, NP Robbie Lis, [facility] Katie, [place] Brynda Peon, NP [place] Lee, NP Karle Plumber, PharmD   Please be sure to bring in all your medications bottles to every appointment.   Need to Contact us:  If you have any questions or concerns before your next appointment please send [place] a message through [place] or call our office at [phone].    TO LEAVE A MESSAGE FOR THE NURSE SELECT OPTION 2, PLEASE LEAVE A MESSAGE INCLUDING: YOUR NAME DATE OF BIRTH CALL BACK NUMBER REASON FOR CALL**this is important as we prioritize the call backs  YOU WILL RECEIVE A CALL BACK THE SAME DAY AS LONG AS YOU CALL BEFORE 4:00 PM

## 2023-04-10 ENCOUNTER — Telehealth: Payer: Self-pay

## 2023-04-10 ENCOUNTER — Other Ambulatory Visit (HOSPITAL_COMMUNITY): Payer: Self-pay

## 2023-04-10 NOTE — Telephone Encounter (Signed)
 Advanced Heart Failure Patient Advocate Encounter  The patient was approved for a [employer] grant that will help cover the cost of Jardiance, Losartan, Metoprolol.  Total amount awarded, $10,000.  Effective: 03/11/2023 - 03/09/2024.  BIN [id number] PCN [id number] Group [id number] ID [id number]  Pharmacy provided with approval and processing information. Patient informed via phone.  Burnell Blanks, CPhT Rx Patient Advocate Phone: [phone]

## 2023-04-15 DIAGNOSIS — E1122 Type 2 diabetes mellitus with diabetic chronic kidney disease: Secondary | ICD-10-CM | POA: Insufficient documentation

## 2023-04-24 ENCOUNTER — Ambulatory Visit: Payer: Medicare Other | Attending: Internal Medicine | Admitting: *Deleted

## 2023-04-24 DIAGNOSIS — I4819 Other persistent atrial fibrillation: Secondary | ICD-10-CM

## 2023-04-24 DIAGNOSIS — Z5181 Encounter for therapeutic drug level monitoring: Secondary | ICD-10-CM | POA: Diagnosis not present

## 2023-04-24 LAB — POCT INR: INR: 3.5 — AB (ref 2.0–3.0)

## 2023-04-24 NOTE — Patient Instructions (Signed)
 Hold warfarin tonight then resume 2 1/2 tablets daily except 3 tablets on Mondays. Continue taking warfarin at night Be consistent with Vit K foods Recheck in 3 wks

## 2023-04-26 ENCOUNTER — Telehealth: Payer: Self-pay | Admitting: Family

## 2023-04-26 NOTE — Telephone Encounter (Incomplete)
 Called to confirm/remind patient of their appointment at the Advanced Heart Failure Clinic on 04/30/23***.   Appointment:   [x] Confirmed  [] Left mess   [] No answer/No voice mail  [] Phone not in service  Patient reminded to bring all medications and/or complete list.  Confirmed patient has transportation. Gave directions, instructed to utilize valet parking.

## 2023-04-29 ENCOUNTER — Telehealth: Payer: Self-pay | Admitting: Family

## 2023-04-29 NOTE — Progress Notes (Unsigned)
 Advanced Heart Failure Clinic Note    Referring Physician: recent admission PCP: Catalina Lunger, DO (last seen 02/25; returns next week) Cardiologist: Marjo Bicker, MD (last seen 01/25)  Chief Complaint: shortness of breath  HPI:  Miguel Cox is a 73 y/o male with a history of persistent A-fib, hypertension, type 2 diabetes, acquired thrombocytopenia, anemia, dilatation of aorta and chronic heart failure. Denies having had a sleep study or ever wearing CPAP.   Echo 08/31/19: EF 60-65% with moderate LVH, severe LAE, RV normal, trivial Miguel, mild dilatation of the aortic root measuring 42 mm.   Chest CTA angio 03/27/23: 4.0 cm fusiform dilatation of the ascending thoracic aorta. Continue annual imaging followup by CTA or MRA  Admitted to [facility] 04/02/23 with worsening dyspnea, lower extremity edema, and orthopnea. Found to have elevated BNP and profound edema. Pulmonary edema seen on CXR and he was admitted with HF exacerbation. IV diuresed and put out >6L of fluids. Repeat echocardiogram revealed LVEF 55%, left ventricle with mild dilation and diastolic parameters were indeterminate. Right ventricular function is moderately reduced and ventricular size is enlarged. Symptoms improved. Did develop AKI due to diuresis. Lasix held due to kidney function.   Echo 04/03/23: EF 55%, moderately reduced RV, trivial Miguel/ AR  He presents today, with his wife, for his initial HF visit with a chief complaint of shortness of breath with moderate exertion. Was SOB upon walking to the office from the parking lot but recovers quickly. Has associated fatigue along with this. Reports sleeping well on 1 pillow. Denies fatigue, chest pain, cough, palpitations, abdominal distention, pedal edema, dizziness or weight gain. Overall he says that he feels "much better" than prior to admission when he had to sleep sitting up right because of SOB and his legs were extremely swollen.   Weighing daily. Has been  reading food labels for sodium content.    Review of Systems: [y] = yes, [ ]  = no   General: Weight gain [ ] ; Weight loss [ ] ; Anorexia [ ] ; Fatigue Cove.Etienne ]; Fever [ ] ; Chills [ ] ; Weakness [ ]   Cardiac: Chest pain/pressure [ ] ; Resting SOB [ ] ; Exertional SOB Cove.Etienne ]; Orthopnea [ ] ; Pedal Edema [ ] ; Palpitations [ ] ; Syncope [ ] ; Presyncope [ ] ; Paroxysmal nocturnal dyspnea[ ]   Pulmonary: Cough [ ] ; Wheezing[ ] ; Hemoptysis[ ] ; Sputum [ ] ; Snoring [ ]   GI: Vomiting[ ] ; Dysphagia[ ] ; Melena[ ] ; Hematochezia [ ] ; Heartburn[ ] ; Abdominal pain [ ] ; Constipation [ ] ; Diarrhea [ ] ; BRBPR [ ]   GU: Hematuria[ ] ; Dysuria [ ] ; Nocturia[ ]   Vascular: Pain in legs with walking [ ] ; Pain in feet with lying flat [ ] ; Non-healing sores [ ] ; Stroke [ ] ; TIA [ ] ; Slurred speech [ ] ;  Neuro: Headaches[ ] ; Vertigo[ ] ; Seizures[ ] ; Paresthesias[ ] ;Blurred vision [ ] ; Diplopia [ ] ; Vision changes [ ]   Ortho/Skin: Arthritis [ ] ; Joint pain [ ] ; Muscle pain [ ] ; Joint swelling [ ] ; Back Pain [ ] ; Rash [ ]   Psych: Depression[ ] ; Anxiety[ ]   Heme: Bleeding problems [ ] ; Clotting disorders [ ] ; Anemia Cove.Etienne ]  Endocrine: Diabetes Cove.Etienne ]; Thyroid dysfunction[ ]    Past Medical History:  Diagnosis Date   Atrial fibrillation (HCC)    Diagnosed 2020   Diverticula of intestine    DJD (degenerative joint disease)    Dysrhythmia    Essential hypertension    Gout    OSA (obstructive sleep apnea)  Intolerant of CPAP   Osteoarthritis    Polio    Type 2 diabetes mellitus (HCC)     Current Outpatient Medications  Medication Sig Dispense Refill   amLODipine (NORVASC) 10 MG tablet Take 10 mg by mouth daily.     atorvastatin (LIPITOR) 40 MG tablet Take 40 mg by mouth at bedtime.  3   colchicine 0.6 MG tablet Take 0.6 mg by mouth daily as needed (gout). Take 2 tablets and one hour later take a third tablet as needed for gout     diphenhydrAMINE (BENADRYL) 25 mg capsule Take 25 mg by mouth every 6 (six) hours as needed for  allergies.     empagliflozin (JARDIANCE) 10 MG TABS tablet Take 1 tablet (10 mg total) by mouth daily before breakfast. 30 tablet 6   EPINEPHrine (EPIPEN 2-PAK) 0.3 mg/0.3 mL IJ SOAJ injection Inject 0.3 mLs (0.3 mg total) into the muscle once. 1 Device 1   [Paused] glipiZIDE (GLUCOTROL XL) 10 MG 24 hr tablet Take 10 mg by mouth daily. (Patient taking differently: Take 5 mg by mouth daily.)     Krill Oil 1000 MG CAPS Take 1,000 mg by mouth daily.      loperamide (IMODIUM) 2 MG capsule Take 2 mg by mouth as needed for diarrhea or loose stools.     losartan (COZAAR) 50 MG tablet TAKE 1 TABLET BY MOUTH EVERY DAY 90 tablet 1   [Paused] metFORMIN (GLUCOPHAGE-XR) 500 MG 24 hr tablet Take 500 mg by mouth 2 (two) times daily.     Metoprolol Tartrate 75 MG TABS Take 1 tablet by mouth 2 (two) times daily.     Multiple Vitamin (MULTIVITAMIN WITH MINERALS) TABS tablet Take 1 tablet by mouth daily.     sildenafil (VIAGRA) 100 MG tablet Take 1 tablet by mouth daily as needed.     warfarin (COUMADIN) 3 MG tablet TAKE 2 AND 1/2 TABLETS DAILY EXCEPT 3 TABLETS ON MONDAYS OR AS DIRECTED BY COUMADIN CLINIC 230 tablet 1   No current facility-administered medications for this visit.    Allergies  Allergen Reactions   Other Swelling    Pt can't eat anything from [facility], it causes swelling   Penicillins Hives and Itching    Did it involve swelling of the face/tongue/throat, SOB, or low BP? No Did it involve sudden or severe rash/hives, skin peeling, or any reaction on the inside of your mouth or nose? No Did you need to seek medical attention at a hospital or doctor's office? No When did it last happen?       If all above answers are "NO", may proceed with cephalosporin use. Tolerated Cephalosporin Date: 07/27/21.     Cefazolin Rash      Social History   Socioeconomic History   Marital status: Married    Spouse name: Not on file   Number of children: Not on file   Years of education: Not on file    Highest education level: 10th grade  Occupational History   Not on file  Tobacco Use   Smoking status: Never   Smokeless tobacco: Never  Vaping Use   Vaping status: Never Used  Substance and Sexual Activity   Alcohol use: Not Currently   Drug use: No   Sexual activity: Not on file  Other Topics Concern   Not on file  Social History Narrative   Retired Naval architect   Social Drivers of Health   Financial Resource Strain: Low Risk  (04/04/2023)  Overall Financial Resource Strain (CARDIA)    Difficulty of Paying Living Expenses: Not hard at all  Food Insecurity: No Food Insecurity (04/04/2023)   Hunger Vital Sign    Worried About Running Out of Food in the Last Year: Never true    Ran Out of Food in the Last Year: Never true  Transportation Needs: No Transportation Needs (04/04/2023)   PRAPARE - Administrator, Civil Service (Medical): No    Lack of Transportation (Non-Medical): No  Physical Activity: Insufficiently Active (03/08/2023)   Received from [facility]   Exercise Vital Sign    Days of Exercise per Week: 2 days    Minutes of Exercise per Session: 30 min  Stress: No Stress Concern Present (03/08/2023)   Received from [facility] - Occupational Stress Questionnaire    Feeling of Stress : Not at all  Social Connections: Patient Declined (04/02/2023)   Social Connection and Isolation Panel [NHANES]    Frequency of Communication with Friends and Family: Patient declined    Frequency of Social Gatherings with Friends and Family: Patient declined    Attends Religious Services: Patient declined    Database administrator or Organizations: Patient declined    Attends Banker Meetings: Patient declined    Marital Status: Patient declined  Intimate Partner Violence: Not At Risk (04/02/2023)   Humiliation, Afraid, Rape, and Kick questionnaire    Fear of Current or Ex-Partner: No    Emotionally Abused: No     Physically Abused: No    Sexually Abused: No      Family History  Problem Relation Age of Onset   Cancer Mother    Heart attack Brother 72   There were no vitals filed for this visit.  Wt Readings from Last 3 Encounters:  04/09/23 287 lb (130.2 kg)  04/05/23 273 lb 9.5 oz (124.1 kg)  02/22/23 279 lb 12.8 oz (126.9 kg)   Lab Results  Component Value Date   CREATININE 1.59 (H) 04/05/2023   CREATININE 1.46 (H) 04/04/2023   CREATININE 1.29 (H) 04/03/2023    PHYSICAL EXAM:  General: Well appearing. No resp difficulty HEENT: normal Neck: supple, no JVD Cor: Irregular rhythm, regular rate. No rubs, gallops or murmurs Lungs: clear Abdomen: soft, nontender, nondistended. Extremities: no cyanosis, clubbing, rash, 1+ pitting edema bilaterally with L>R Neuro: alert & oriented X 3. Moves all 4 extremities w/o difficulty. Affect pleasant   ECG: not done   ASSESSMENT & PLAN:  1: Chronic heart failure with preserved ejection fraction- - suspect due to persistent atrial fibrillation/ HTN - NYHA class II - euvolemic - weighing daily & understands parameters to call about - Echo 08/31/19: EF 60-65% with moderate LVH, severe LAE, RV normal, trivial Miguel, mild dilatation of the aortic root measuring 42 mm.  - Echo 04/03/23: EF 55%, moderately reduced RV, trivial Miguel/ AR - begin jardiance 10mg  daily; 30 day coupon provided and PharmD will be looking to see if he qualifies for a grant  - BMET next visit - continue losartan 50mg  daily - discussed adding spiro at next visit - wearing compression socks daily with removal at bedtime - elevates his legs when sitting for long periods of time - not adding salt to his food and has been reading food labels for sodium content - BNP 04/02/23 reviewed and was 244.0  2: HTN- - BP 159/80 - saw PCP ([facility]) 02/25; returns next week -  continue amlodipine 10mg  daily - BMET 04/05/23 reviewed and showed sodium 137, potassium 4.0, creatinine 1.59 &  GFR 45  3: T2DM- - A1c 04/02/23 reviewed and was 5.7% - currently holding glipizide and metformin (has PCP appt next week)  4: Persistent atrial fibrillation- - saw cardiology in [facility] 01/25 - continue metoprolol tartrate 75mg  BID - continue warfarin as directed  5: Aortic root dilatation- - Chest CTA angio 03/27/23: 4.0 cm fusiform dilatation of the ascending thoracic aorta. - yearly follow-up  6: Hyperlipidemia- - continue atorvastatin 40mg  daily - LDL 03/08/23 reviewed and was 62   Return in 3 weeks, sooner if needed.   Delma Freeze, FNP 04/29/23

## 2023-04-29 NOTE — Telephone Encounter (Signed)
 Called to confirm/remind patient of their appointment at the Advanced Heart Failure Clinic on 04/29/23.   Appointment:   [x] Confirmed  [] Left mess   [] No answer/No voice mail  [] Phone not in service  Patient reminded to bring all medications and/or complete list.  Confirmed patient has transportation. Gave directions, instructed to utilize valet parking.

## 2023-04-30 ENCOUNTER — Ambulatory Visit: Attending: Family | Admitting: Family

## 2023-04-30 ENCOUNTER — Encounter: Payer: Self-pay | Admitting: Family

## 2023-04-30 VITALS — BP 132/66 | HR 58 | Wt 281.2 lb

## 2023-04-30 DIAGNOSIS — E1122 Type 2 diabetes mellitus with diabetic chronic kidney disease: Secondary | ICD-10-CM | POA: Diagnosis not present

## 2023-04-30 DIAGNOSIS — I4819 Other persistent atrial fibrillation: Secondary | ICD-10-CM | POA: Diagnosis not present

## 2023-04-30 DIAGNOSIS — Z79899 Other long term (current) drug therapy: Secondary | ICD-10-CM | POA: Diagnosis not present

## 2023-04-30 DIAGNOSIS — I5032 Chronic diastolic (congestive) heart failure: Secondary | ICD-10-CM | POA: Insufficient documentation

## 2023-04-30 DIAGNOSIS — E785 Hyperlipidemia, unspecified: Secondary | ICD-10-CM | POA: Diagnosis not present

## 2023-04-30 DIAGNOSIS — I10 Essential (primary) hypertension: Secondary | ICD-10-CM | POA: Diagnosis not present

## 2023-04-30 DIAGNOSIS — Z7901 Long term (current) use of anticoagulants: Secondary | ICD-10-CM | POA: Insufficient documentation

## 2023-04-30 DIAGNOSIS — I110 Hypertensive heart disease with heart failure: Secondary | ICD-10-CM | POA: Insufficient documentation

## 2023-04-30 DIAGNOSIS — Z7984 Long term (current) use of oral hypoglycemic drugs: Secondary | ICD-10-CM | POA: Diagnosis not present

## 2023-04-30 DIAGNOSIS — E119 Type 2 diabetes mellitus without complications: Secondary | ICD-10-CM | POA: Diagnosis not present

## 2023-04-30 DIAGNOSIS — N1831 Chronic kidney disease, stage 3a: Secondary | ICD-10-CM

## 2023-04-30 DIAGNOSIS — I77810 Thoracic aortic ectasia: Secondary | ICD-10-CM

## 2023-04-30 MED ORDER — SPIRONOLACTONE 25 MG PO TABS: 25.0000 mg | ORAL_TABLET | Freq: Every day | ORAL | 3 refills | Status: AC

## 2023-04-30 NOTE — Patient Instructions (Signed)
 Medication Changes:  START Spironolactone 25mg  (1 tab) daily   Follow-Up in: Please follow up with the Advanced Heart Failure Clinic in 1 month.  At the Advanced Heart Failure Clinic, you and your health needs are our priority. We have a designated team specialized in the treatment of Heart Failure. This Care Team includes your primary Heart Failure Specialized Cardiologist (physician), Advanced Practice Providers (APPs- Physician Assistants and Nurse Practitioners), and Pharmacist who all work together to provide you with the care you need, when you need it.   You may see any of the following providers on your designated Care Team at your next follow up:  Dr. Arvilla Meres Dr. Marca Ancona Dr. Dorthula Nettles Dr. Theresia Bough Clarisa Kindred, FNP Enos Fling, RPH-CPP  Please be sure to bring in all your medications bottles to every appointment.   Need to Contact us:  If you have any questions or concerns before your next appointment please send [place] a message through [place] or call our office at [phone].    TO LEAVE A MESSAGE FOR THE NURSE SELECT OPTION 2, PLEASE LEAVE A MESSAGE INCLUDING: YOUR NAME DATE OF BIRTH CALL BACK NUMBER REASON FOR CALL**this is important as we prioritize the call backs  YOU WILL RECEIVE A CALL BACK THE SAME DAY AS LONG AS YOU CALL BEFORE 4:00 PM

## 2023-04-30 NOTE — Progress Notes (Addendum)
 [facility] - HEART FAILURE CLINIC - PHARMACIST COUNSELING NOTE  Adherence Assessment  Do you ever forget to take your medication? [] Yes [x] No  Do you ever skip doses due to side effects?  [] Yes [x] No  Do you have trouble affording your medicines?  [] Yes [x] No  Are you ever unable to pick up your medication due to transportation difficulties? [] Yes [x] No  Do you ever stop taking your medications because you don't believe they are helping? [] Yes [] No  Do you check your weight daily?  [x] Yes [] No  Do you check your blood pressure daily?  [x] Yes [] No  Adherence strategy: Pill box  Barriers to obtaining medications: None reported    Vital signs: HR 58, BP 132/66, weight 281 lbs.  ECHO: Date 04/03/23, EF 55%  Renal function: Date 04/05/23, GFR 45  Current Guideline-Directed Medical Therapy/Evidence Based Medicine  ACE/ARB/ARNI: Losartan 50 mg daily Target dose: 150 mg daily   Beta Blocker: Metoprolol tartrate 75 mg twice daily  Target dose: 200 mg daily (succinate formulation)   Aldosterone Antagonist:  N/A Target dose: N/A  SGLT2i: Empagliflozin 10 mg daily Target dose: On target dose  Diuretic:  N/A  ASSESSMENT 73 year old male who presents to the HF clinic for a 3 week follow-up appointment. PMH is significant for  persistent A-fib, hypertension, type 2 diabetes, acquired thrombocytopenia, anemia, dilatation of aorta and chronic heart failure. Patient here today with his wife. Says he feels great and does not have any complaints. Discussed potential initiation of spironolactone today. Patient is open to starting this medication but has concerns over the cost. Cost would be covered by [employer] grant that was approved 03/11/2023 - 03/09/2024.   Recent ED visit or hospitalization (past 6 months):  Date: 04/02/23 - 04/05/23, CC: SOB, Admission Dx (if applicable): CHF & afib  COUNSELING POINTS/CLINICAL PEARLS   Spironolactone (Goal: 25-50 mg daily) Warn  patient to report dehydration, hypotension, or symptoms of worsening renal function.  Counsel male patient to report gynecomastia.  Side effects may include diarrhea, nausea, vomiting, abdominal cramping, fever, leg cramps, lethargy, mental confusion, decreased libido (spironolactone), irregular menses (spironolactone), and rash. Advise patient to avoid potassium supplements and foods containing high levels of potassium, including salt substitutes.  PLAN Recommendations discussed with NP Consider initiation of spironolactone - renal function and K+ okay  BMET one week after initiation  Consider titration of ACE and beta-blocker to target doses if/when able to tolerate  Time spent: 20 minutes   Littie Deeds, PharmD Pharmacy Resident  04/30/2023 8:29 AM

## 2023-05-15 ENCOUNTER — Ambulatory Visit: Attending: Internal Medicine

## 2023-05-16 ENCOUNTER — Ambulatory Visit: Attending: Cardiology | Admitting: *Deleted

## 2023-05-16 DIAGNOSIS — I4819 Other persistent atrial fibrillation: Secondary | ICD-10-CM | POA: Insufficient documentation

## 2023-05-16 DIAGNOSIS — Z5181 Encounter for therapeutic drug level monitoring: Secondary | ICD-10-CM | POA: Insufficient documentation

## 2023-05-16 LAB — POCT INR: INR: 3.4 — AB (ref 2.0–3.0)

## 2023-05-16 NOTE — Patient Instructions (Signed)
 Hold warfarin tonight then decrease dose to 2 1/2 tablets daily except 2 tablets on Mondays. Continue taking warfarin at night Be consistent with Vit K foods Recheck in 3 wks

## 2023-05-17 ENCOUNTER — Telehealth: Payer: Self-pay | Admitting: *Deleted

## 2023-05-17 NOTE — Telephone Encounter (Signed)
   Pre-operative Risk Assessment    Patient Name: Miguel Cox  DOB: 12-Aug-1950 MRN: [id number]   Date of last office visit: 04/30/23 Shawnee Dellen, FNP Date of next office visit: 06/03/23 Shawnee Dellen, FNP; 08/26/23 Lasalle Pointer, NP   Request for Surgical Clearance    Procedure:   LEFT REVERSE TOTAL SHOULDER ARTHROPLASTY  Date of Surgery:  Clearance TBD                                Surgeon: DR. Marionette Sick Surgeon's Group or Practice Name:  [facility] Phone number:  [phone] KERRI MAZE Fax number:  [phone]   Type of Clearance Requested:   - Medical  - Pharmacy:  Hold Warfarin (Coumadin )     Type of Anesthesia:   CHOICE   Additional requests/questions:    [name]   05/17/2023, 1:08 PM

## 2023-05-17 NOTE — Telephone Encounter (Signed)
 Pharmacy please advise on holding Coumadin  prior to left reverse total shoulder arthroplasty scheduled for TBD. Thank you.

## 2023-05-20 NOTE — Telephone Encounter (Signed)
 Patient with diagnosis of atrial fibrillation on warfarin for anticoagulation.    Procedure:   LEFT REVERSE TOTAL SHOULDER ARTHROPLASTY   Date of Surgery:  Clearance TBD     CHA2DS2-VASc Score = 3   This indicates a 3.2% annual risk of stroke. The patient's score is based upon: CHF History: 0 HTN History: 1 Diabetes History: 1 Stroke History: 0 Vascular Disease History: 0 Age Score: 1 Gender Score: 0    CrCl 87 Platelet count 238  Per office protocol, patient can hold warfarin for 5 days prior to procedure.   Patient will not need bridging with Lovenox (enoxaparin) around procedure.  **This guidance is not considered finalized until pre-operative APP has relayed final recommendations.**

## 2023-05-21 NOTE — Telephone Encounter (Signed)
   Name: Miguel Cox  DOB: 07/30/1950  MRN: [id number]  Primary Cardiologist: Lasalle Pointer, MD  Chart reviewed as part of pre-operative protocol coverage. Because of Miguel Cox's past medical history and time since last visit, he will require a follow-up in-office visit in order to better assess preoperative cardiovascular risk.  Patient was hospitalized in March 2025 in the setting of acute heart failure exacerbation.  He has not been seen in follow-up since.  Pre-op covering staff: - Please schedule appointment and call patient to inform them. If patient already had an upcoming appointment within acceptable timeframe, please add "pre-op clearance" to the appointment notes so provider is aware. - Please contact requesting surgeon's office via preferred method (i.e, phone, fax) to inform them of need for appointment prior to surgery.  Per office protocol, patient can hold warfarin for 5 days prior to procedure.   Patient will not need bridging with Lovenox (enoxaparin) around procedure.  Jude Norton, NP  05/21/2023, 8:22 AM

## 2023-05-22 ENCOUNTER — Encounter: Payer: Self-pay | Admitting: Physician Assistant

## 2023-05-22 ENCOUNTER — Ambulatory Visit: Attending: Student | Admitting: Physician Assistant

## 2023-05-22 VITALS — BP 136/82 | HR 65 | Ht 72.0 in | Wt 278.2 lb

## 2023-05-22 DIAGNOSIS — I77819 Aortic ectasia, unspecified site: Secondary | ICD-10-CM

## 2023-05-22 DIAGNOSIS — E785 Hyperlipidemia, unspecified: Secondary | ICD-10-CM

## 2023-05-22 DIAGNOSIS — R609 Edema, unspecified: Secondary | ICD-10-CM | POA: Diagnosis present

## 2023-05-22 DIAGNOSIS — I10 Essential (primary) hypertension: Secondary | ICD-10-CM | POA: Diagnosis present

## 2023-05-22 DIAGNOSIS — I4819 Other persistent atrial fibrillation: Secondary | ICD-10-CM

## 2023-05-22 DIAGNOSIS — Z01810 Encounter for preprocedural cardiovascular examination: Secondary | ICD-10-CM | POA: Diagnosis present

## 2023-05-22 DIAGNOSIS — I5030 Unspecified diastolic (congestive) heart failure: Secondary | ICD-10-CM | POA: Diagnosis present

## 2023-05-22 MED ORDER — FUROSEMIDE 20 MG PO TABS: 20.0000 mg | ORAL_TABLET | Freq: Every day | ORAL | 11 refills | Status: AC

## 2023-05-22 NOTE — Progress Notes (Signed)
 Cardiology Office Note:  .   Date:  05/22/2023  ID:  Miguel Cox, DOB 12/19/1950, MRN [id number] PCP: Avelina Bode, DO  [facility] Providers Cardiologist:  Lasalle Pointer, MD { History of Present Illness: .   Miguel Cox is a 73 y.o. male with pmhx of persistent A-fib, HTN, DM2, OSA on CPAP presents to OV for preoperative cardiovascular risk assessment.   Surgery: Left reverse Total Shoulder Arthroplasty Date: TBD  Surgeon: Dr. Kenneth Peace of [facility]   Last seen in Advanced HF clinic on 04/30/2023 for hospital f/u and pedal edema. At that time, he was doing well and denied any other cardiac concerns.  He was continued on daily dose of amlodipine  10 mg, Lipitor 40 mg, Jardiance  10 mg, losartan  50 mg, Lopressor  75 mg BID, and warfarin 3 mg (2.5 tablets daily except 3 tablets on Monday).  Started on spironolactone  25 mg daily  During interview, patient denied any cardiac symptoms including chest pain, shortness of breath, palpitations, syncope, presyncope, dizziness, orthopnea, PND, swelling or significant weight changes, acute bleeding, or claudication. Although he denied any edema, exam reveals +2 pitting edema bilaterally. Reports good urine output and significant improvement in LE edema since starting spironolactone .  Reported that surgery was previously never scheduled due to not receiving cardiac clearance note. Endorsed low sodium diet.  Studies Reviewed: Aaron Aas   EKG Interpretation Date/Time:  Wednesday May 22 2023 14:30:06 EDT Ventricular Rate:  60 PR Interval:    QRS Duration:  108 QT Interval:  422 QTC Calculation: 422 R Axis:   104  Text Interpretation: Atrial fibrillation Rightward axis When compared with ECG of 02-Apr-2023 11:16, No significant change was found Confirmed by Odetta Benes ([id number]) on 05/22/2023 2:35:42 PM   CTA 03/2023 IMPRESSION: 1. 4.0 cm fusiform dilatation of the ascending thoracic aorta. Continue annual imaging followup by CTA  or MRA. This recommendation follows 2010 ACCF/AHA/AATS/ACR/ASA/SCA/SCAI/SIR/STS/SVM Guidelines for the Diagnosis and Management of Patients with Thoracic Aortic Disease. Circulation. 2010; 121: X914-N829. Aortic aneurysm NOS (ICD10-I71.9) 2. Main pulmonary artery dilation, likely reflecting underlying pulmonary arterial hypertension. 3. Moderate burden of coronary atherosclerosis. Additional incidental, chronic and senescent findings as above.  ECHO 03/2023 IMPRESSIONS   1. Poor acoustic windows limit study, even with Defininty RV difficult to  see.   2. Left ventricular ejection fraction, by estimation, is 55%. The left  ventricular internal cavity size was mildly dilated. Left ventricular  diastolic parameters are indeterminate.   3. Right ventricular systolic function is moderately reduced. The right  ventricular size is mildly enlarged.   4. The mitral valve is normal in structure. Trivial mitral valve  regurgitation.   5. The aortic valve is tricuspid. Aortic valve regurgitation is trivial.  Aortic valve sclerosis/calcification is present, without any evidence of  aortic stenosis.   6. The inferior vena cava is dilated in size with >50% respiratory  variability, suggesting right atrial pressure of 8 mmHg.   Echo 08/2019:   1. Left ventricular ejection fraction, by estimation, is 60 to 65%. The  left ventricle has normal function. The left ventricle has no regional  wall motion abnormalities. There is moderate left ventricular hypertrophy.  Left ventricular diastolic  parameters are indeterminate.   2. Right ventricular systolic function is normal. The right ventricular  size is normal.   3. Left atrial size was severely dilated.   4. Right atrial size was mild to moderately dilated.   5. The mitral valve is normal in structure. Trivial mitral valve  regurgitation. No evidence of mitral stenosis.   6. The aortic valve is tricuspid. Aortic valve regurgitation is not   visualized. No aortic stenosis is present.   7. Aortic dilatation noted. 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   8. The inferior vena cava is normal in size with greater than 50%  respiratory variability, suggesting right atrial pressure of 3 mmHg.    CHA2DS2-VASc Score = 3  This indicates a 3.2% annual risk of stroke. The patient's score is based upon: CHF History: 0 HTN History: 1 Diabetes History: 1 Stroke History: 0 Vascular Disease History: 0 Age Score: 1 Gender Score: 0     Physical Exam:   VS:  BP 136/82   Pulse 65   Ht 6' (1.829 m)   Wt 278 lb 3.2 oz (126.2 kg)   SpO2 97%   BMI 37.73 kg/m    Wt Readings from Last 3 Encounters:  05/22/23 278 lb 3.2 oz (126.2 kg)  04/30/23 281 lb 3.2 oz (127.6 kg)  04/09/23 287 lb (130.2 kg)    GEN: Well nourished, well developed in no acute distress NECK: No JVD; No carotid bruits CARDIAC: irreg, irreg, no murmurs, rubs, gallops RESPIRATORY:  Clear to auscultation without rales, wheezing or rhonchi  ABDOMEN: Soft, non-tender, non-distended EXTREMITIES:  2+ pitting edema bilateral; No deformity   ASSESSMENT AND PLAN: .   Preoperative Cardiovascular Risk Assessment Revised Cardiac Risk Index (RCRI) with 1 point and perioperative risk of major cardiac events is 0.9 %.  Duke Activity Status Index (DASI) with > 4 mets .  From cardiac standpoint, this patient has a low cardiac risk. Therefore, based on ACC/AHA guidelines, patient at acceptable risk for the planned procedure without further cardiovascular testing as long as pending lab work is normal.  Hold Warfarin for 5 days prior to procedure per Pharmacy.   Persistent Afib  EKG today: Afib, controlled, HR 60's Continue Lopressor  50 mg and Warfarin 3 mg, dosage per warfarin clinic.  Denied active bleeding. Continue to follow up with Warfarin clinic.  Ordered CBC to complete in 1 week  Hold Warfarin for 5 days prior to procedure per Pharmacy.   Chronic  HFpEF  Bilateral LE edema previously suspect 2/2 to persistent afib and HTN   ECHO 03/2023: LVEF 55%, mildly dilated LV cavity, moderately reduced RV systolic function, mildly enlarged RV size, trivial MVR/AVR, aortic valve calcification 05/01/2023 (care everywhere) Cr 1.35, K 4.1. 03/2023: BNP 244. Ordered BMP, BNP to complete in 1 week +2 bilateral LE, Added Lasix  20 mg daily.  Continue losartan  50 mg, Lopressor  75 mg twice daily and spironolactone  25 mg daily If LE edema does not improve, can consider decreasing Amlodipine  dose to 5 mg.  Can consider adding SGLT2i in future   HTN  BP in office: 140/84, repeat BP 136/82, Home BP: 140/70, SBP goal < 140 Continue daily dose of Losartan  50 mg and Lopressor  75 mg BID.   HLD  LDL 03/2023: 62 Continue of Lipitor 40 mg daily  Dilated Aortic Root  ECHO 2021: measured 42 mm  CTA aorta 03/2023: measured 40 mm. Recommended annual follow up by CTA or MRA.Aaron Aas     Dispo: CBC/BMP/BNP in 1 week after starting PO lasix . Follow up with HF clinic 06/03/2023. Follow up with Lasalle Pointer, NP 08/26/2023.   Signed, Metta Actis, PA-C

## 2023-05-22 NOTE — Patient Instructions (Signed)
 Medication Instructions:  Your physician has recommended you make the following change in your medication:   Start Lasix  20 mg Daily    *If you need a refill on your cardiac medications before your next appointment, please call your pharmacy*  Lab Work: Your physician recommends that you return for lab work in: 1 week ( BNP, CBC, BMET)   If you have labs (blood work) drawn today and your tests are completely normal, you will receive your results only by: MyChart Message (if you have MyChart) OR A paper copy in the mail If you have any lab test that is abnormal or we need to change your treatment, we will call you to review the results.  Testing/Procedures: NONE    Follow-Up: At [facility], you and your health needs are our priority.  As part of our continuing mission to provide you with exceptional heart care, our providers are all part of one team.  This team includes your primary Cardiologist (physician) and Advanced Practice Providers or APPs (Physician Assistants and Nurse Practitioners) who all work together to provide you with the care you need, when you need it.  Your next appointment:    With Heart Failure as Scheduled   Provider:   You may see Vishnu P Mallipeddi, MD or one of the following Advanced Practice Providers on your designated Care Team:   [place], PA-C  [place], [place] Theotis Flake, [place]     We recommend signing up for the patient portal called "MyChart".  Sign up information is provided on this After Visit Summary.  MyChart is used to connect with patients for Virtual Visits (Telemedicine).  Patients are able to view lab/test results, encounter notes, upcoming appointments, etc.  Non-urgent messages can be sent to your provider as well.   To learn more about what you can do with MyChart, go to [email or web address].   Other Instructions Thank you for choosing [facility]!

## 2023-05-29 ENCOUNTER — Other Ambulatory Visit (HOSPITAL_COMMUNITY)
Admission: RE | Admit: 2023-05-29 | Discharge: 2023-05-29 | Disposition: A | Discharge status: Home / Self Care | Source: Ambulatory Visit | Attending: Physician Assistant | Admitting: Physician Assistant

## 2023-05-29 DIAGNOSIS — R609 Edema, unspecified: Secondary | ICD-10-CM | POA: Diagnosis present

## 2023-05-29 DIAGNOSIS — I5030 Unspecified diastolic (congestive) heart failure: Secondary | ICD-10-CM | POA: Insufficient documentation

## 2023-05-29 DIAGNOSIS — I4819 Other persistent atrial fibrillation: Secondary | ICD-10-CM | POA: Insufficient documentation

## 2023-05-29 LAB — CBC
HCT: 42 % (ref 39.0–52.0)
Hemoglobin: 13.1 g/dL (ref 13.0–17.0)
MCH: 26.9 pg (ref 26.0–34.0)
MCHC: 31.2 g/dL (ref 30.0–36.0)
MCV: 86.2 fL (ref 80.0–100.0)
Platelets: 249 10*3/uL (ref 150–400)
RBC: 4.87 MIL/uL (ref 4.22–5.81)
RDW: 15.3 % (ref 11.5–15.5)
WBC: 9.8 10*3/uL (ref 4.0–10.5)
nRBC: 0 % (ref 0.0–0.2)

## 2023-05-29 LAB — BASIC METABOLIC PANEL WITH GFR
Anion gap: 10 (ref 5–15)
BUN: 24 mg/dL — ABNORMAL HIGH (ref 8–23)
CO2: 24 mmol/L (ref 22–32)
Calcium: 9.2 mg/dL (ref 8.9–10.3)
Chloride: 103 mmol/L (ref 98–111)
Creatinine, Ser: 1.52 mg/dL — ABNORMAL HIGH (ref 0.61–1.24)
GFR, Estimated: 48 mL/min — ABNORMAL LOW (ref >60)
Glucose, Bld: 135 mg/dL — ABNORMAL HIGH (ref 70–99)
Potassium: 4.3 mmol/L (ref 3.5–5.1)
Sodium: 137 mmol/L (ref 135–145)

## 2023-05-29 LAB — BRAIN NATRIURETIC PEPTIDE: B Natriuretic Peptide: 226 pg/mL — ABNORMAL HIGH (ref 0.0–100.0)

## 2023-05-31 ENCOUNTER — Telehealth: Payer: Self-pay | Admitting: Family

## 2023-05-31 NOTE — Telephone Encounter (Signed)
 Called to confirm/remind patient of their appointment at the Advanced [facility] Failure Clinic on 06/03/23.   Appointment:   [x] Confirmed  [] Left mess   [] No answer/No voice mail  [] VM Full/unable to leave message  [] Phone not in service  Patient reminded to bring all medications and/or complete list.  Confirmed patient has transportation. Gave directions, instructed to utilize valet parking.

## 2023-05-31 NOTE — Progress Notes (Unsigned)
 Advanced Heart Failure Clinic Note     PCP: [facility] (last seen 04/25) Cardiologist: Lasalle Pointer, MD (last seen 04/25)  Chief Complaint: fatigue  HPI:  Miguel Cox is a 73 y/o male with a history of persistent A-fib, hypertension, type 2 diabetes, acquired thrombocytopenia, anemia, dilatation of aorta and chronic heart failure. Denies having had a sleep study or ever wearing CPAP.   Echo 08/31/19: EF 60-65% with moderate LVH, severe LAE, RV normal, trivial Miguel, mild dilatation of the aortic root measuring 42 mm.   Chest CTA angio 03/27/23: 4.0 cm fusiform dilatation of the ascending thoracic aorta. Continue annual imaging followup by CTA or MRA  Admitted to [facility] 04/02/23 with worsening dyspnea, lower extremity edema, and orthopnea. Found to have elevated BNP and profound edema. Pulmonary edema seen on CXR and he was admitted with HF exacerbation. IV diuresed and put out >6L of fluids. Repeat echocardiogram revealed LVEF 55%, left ventricle with mild dilation and diastolic parameters were indeterminate. Right ventricular function is moderately reduced and ventricular size is enlarged. Symptoms improved. Did develop AKI due to diuresis. Lasix  held due to kidney function.   Echo 04/03/23: EF 55%, moderately reduced RV, trivial Miguel/ AR  Seen in HF clinic 04/30/23 & spironolactone  25mg  was started.   Seen cardiology 05/22/23 and had lasix  20mg  daily was started due to pedal edema.   He presents today, with his wife, for a HF f/u visit with a chief complaint of very little fatigue. Has associated left shoulder pain and will be having left shoulder replacement surgery 07/10/23. Denies shortness of breath, chest pain, palpitations, abdominal distention, pedal edema, dizziness or difficulty sleeping. Says that since his lasix  was resumed, he's been urinating every couple of hours. Does wear compression socks but didn't wear them today.   Reports having a good appetite and says  that they overate yesterday. Ate mac & cheese, vegetables, cornbread etc and it was so good, they ate it for lunch & dinner.   ROS: All systems negative except what is listed in HPI, PMH and Problem List  Past Medical History:  Diagnosis Date   Atrial fibrillation (HCC)    Diagnosed 2020   Diverticula of intestine    DJD (degenerative joint disease)    Dysrhythmia    Essential hypertension    Gout    OSA (obstructive sleep apnea)    Intolerant of CPAP   Osteoarthritis    Polio    Type 2 diabetes mellitus (HCC)     Current Outpatient Medications  Medication Sig Dispense Refill   amLODipine  (NORVASC ) 10 MG tablet Take 10 mg by mouth daily.     atorvastatin  (LIPITOR) 40 MG tablet Take 40 mg by mouth at bedtime.  3   colchicine  0.6 MG tablet Take 0.6 mg by mouth daily as needed (gout). Take 2 tablets and one hour later take a third tablet as needed for gout     empagliflozin  (JARDIANCE ) 10 MG TABS tablet Take 1 tablet (10 mg total) by mouth daily before breakfast. 30 tablet 6   EPINEPHrine  (EPIPEN  2-PAK) 0.3 mg/0.3 mL IJ SOAJ injection Inject 0.3 mLs (0.3 mg total) into the muscle once. 1 Device 1   furosemide  (LASIX ) 20 MG tablet Take 1 tablet (20 mg total) by mouth daily. 30 tablet 11   [Paused] glipiZIDE  (GLUCOTROL  XL) 10 MG 24 hr tablet Take 10 mg by mouth daily. (Patient taking differently: Take 5 mg by mouth daily.)     Miguel Cox  Oil 1000 MG CAPS Take 1,000 mg by mouth daily.      losartan  (COZAAR ) 50 MG tablet TAKE 1 TABLET BY MOUTH EVERY DAY 90 tablet 1   Metoprolol  Tartrate 75 MG TABS Take 1 tablet by mouth 2 (two) times daily.     Multiple Vitamin (MULTIVITAMIN WITH MINERALS) TABS tablet Take 1 tablet by mouth daily.     sildenafil (VIAGRA) 100 MG tablet Take 1 tablet by mouth daily as needed.     spironolactone  (ALDACTONE ) 25 MG tablet Take 1 tablet (25 mg total) by mouth daily. 90 tablet 3   warfarin (COUMADIN ) 3 MG tablet TAKE 2 AND 1/2 TABLETS DAILY EXCEPT 3 TABLETS ON  MONDAYS OR AS DIRECTED BY COUMADIN  CLINIC 230 tablet 1   No current facility-administered medications for this visit.    Allergies  Allergen Reactions   Enalapril Cough   Other Swelling    Pt can't eat anything from [facility], it causes swelling   Penicillins Hives and Itching    Did it involve swelling of the face/tongue/throat, SOB, or low BP? No Did it involve sudden or severe rash/hives, skin peeling, or any reaction on the inside of your mouth or nose? No Did you need to seek medical attention at a hospital or doctor's office? No When did it last happen?       If all above answers are "NO", may proceed with cephalosporin use. Tolerated Cephalosporin Date: 07/27/21.     Cefazolin  Rash      Social History   Socioeconomic History   Marital status: Married    Spouse name: Not on file   Number of children: Not on file   Years of education: Not on file   Highest education level: 10th grade  Occupational History   Not on file  Tobacco Use   Smoking status: Never   Smokeless tobacco: Never  Vaping Use   Vaping status: Never Used  Substance and Sexual Activity   Alcohol use: Not Currently   Drug use: No   Sexual activity: Not on file  Other Topics Concern   Not on file  Social History Narrative   Retired Naval architect   Social Drivers of Corporate investment banker Strain: Low Risk  (04/04/2023)   Overall Financial Resource Strain (CARDIA)    Difficulty of Paying Living Expenses: Not hard at all  Food Insecurity: No Food Insecurity (04/04/2023)   Hunger Vital Sign    Worried About Running Out of Food in the Last Year: Never true    Ran Out of Food in the Last Year: Never true  Transportation Needs: No Transportation Needs (04/04/2023)   PRAPARE - Administrator, Civil Service (Medical): No    Lack of Transportation (Non-Medical): No  Physical Activity: Insufficiently Active (03/08/2023)   Received from [facility]   Exercise Vital Sign    Days of  Exercise per Week: 2 days    Minutes of Exercise per Session: 30 min  Stress: No Stress Concern Present (03/08/2023)   Received from [facility] of Occupational Health - Occupational Stress Questionnaire    Feeling of Stress : Not at all  Social Connections: Patient Declined (04/02/2023)   Social Connection and Isolation Panel [NHANES]    Frequency of Communication with Friends and Family: Patient declined    Frequency of Social Gatherings with Friends and Family: Patient declined    Attends Religious Services: Patient declined    Active Member  of Clubs or Organizations: Patient declined    Attends Banker Meetings: Patient declined    Marital Status: Patient declined  Intimate Partner Violence: Not At Risk (04/02/2023)   Humiliation, Afraid, Rape, and Kick questionnaire    Fear of Current or Ex-Partner: No    Emotionally Abused: No    Physically Abused: No    Sexually Abused: No      Family History  Problem Relation Age of Onset   Cancer Mother    Heart attack Brother 37   Prior to Admission medications   Medication Sig Start Date End Date Taking? Authorizing Provider  amLODipine  (NORVASC ) 10 MG tablet Take 10 mg by mouth daily.   Yes [provider]  atorvastatin  (LIPITOR) 40 MG tablet Take 40 mg by mouth at bedtime. 03/07/14  Yes [provider]  chlorthalidone (HYGROTON) 25 MG tablet Take 25 mg by mouth daily.   Yes [provider]  colchicine  0.6 MG tablet Take 0.6 mg by mouth daily as needed (gout). Take 2 tablets and one hour later take a third tablet as needed for gout   Yes [provider]  cyclobenzaprine (FLEXERIL) 10 MG tablet Take 10 mg by mouth 3 (three) times daily as needed for muscle spasms.   Yes [provider]  empagliflozin  (JARDIANCE ) 10 MG TABS tablet Take 1 tablet (10 mg total) by mouth daily before breakfast. 04/09/23  Yes Darci Lykins A, FNP  furosemide  (LASIX ) 20 MG tablet Take 1  tablet (20 mg total) by mouth daily. 05/22/23  Yes Dunlap, Sondra Duran, PA-C  glipiZIDE  (GLUCOTROL  XL) 10 MG 24 hr tablet Take 5 mg by mouth daily. 09/30/18  Yes [provider]  Miguel Cox Oil 1000 MG CAPS Take 1,000 mg by mouth daily.    Yes [provider]  Metoprolol  Tartrate 75 MG TABS Take 1 tablet by mouth 2 (two) times daily. 01/09/23  Yes [provider]  Multiple Vitamin (MULTIVITAMIN WITH MINERALS) TABS tablet Take 1 tablet by mouth daily.   Yes [provider]  sildenafil (VIAGRA) 100 MG tablet Take 1 tablet by mouth daily as needed. 01/30/22  Yes [provider]  spironolactone  (ALDACTONE ) 25 MG tablet Take 1 tablet (25 mg total) by mouth daily. 04/30/23 07/29/23 Yes Daily Crate, Brian Campanile A, FNP  warfarin (COUMADIN ) 3 MG tablet TAKE 2 AND 1/2 TABLETS DAILY EXCEPT 3 TABLETS ON MONDAYS OR AS DIRECTED BY COUMADIN  CLINIC 10/30/22  Yes Mallipeddi, Vishnu P, MD  EPINEPHrine  (EPIPEN  2-PAK) 0.3 mg/0.3 mL IJ SOAJ injection Inject 0.3 mLs (0.3 mg total) into the muscle once. 04/26/14   Shara Das, DO  losartan  (COZAAR ) 100 MG tablet Take 1 tablet (100 mg total) by mouth daily. 06/03/23   Charlette Console, FNP     PHYSICAL EXAM:  General: Well appearing. No resp difficulty HEENT: normal Neck: supple, no JVD Cor: Iregular rhythm, bradycardic. No rubs, gallops or murmurs Lungs: clear Abdomen: soft, nontender, nondistended. Extremities: no cyanosis, clubbing, rash, 2+ pitting edema bilateral lower legs Neuro: alert & oriented X 3. Moves all 4 extremities w/o difficulty. Affect pleasant   ECG: not done   ASSESSMENT & PLAN:  1: Chronic heart failure with preserved ejection fraction- - suspect due to persistent atrial fibrillation/ HTN - NYHA class II - euvolemic - weight stable since last visit 1 month ago - Echo 08/31/19: EF 60-65% with moderate LVH, severe LAE, RV normal, trivial Miguel, mild dilatation of the aortic root measuring 42 mm.  - Echo 04/03/23:  EF 55%,  moderately reduced RV, trivial Miguel/ AR - continue furosemide  20mg  daily - continue jardiance  10mg  daily - increase losartan  to 100mg  daily d/t BP - continue spironolactone  25mg  daily - wearing compression socks daily with removal at bedtime - elevates his legs when sitting for long periods of time - not adding salt to his food and has been reading food labels for sodium content - BNP 05/29/23 reviewed and was 226.0  2: HTN- - BP 147/86  - saw PCP ([facility]) 04/25  - increase losartan  to 100mg  daily - continue amlodipine  10mg  daily - BMET 05/29/23 reviewed: sodium 137, potassium 4.3, creatinine 1.52 & GFR 48  3: T2DM- - A1c 04/02/23 reviewed and was 5.7% - continues on jardiance , glipizide   4: Persistent atrial fibrillation- - saw cardiology in [facility] 04/25 - continue metoprolol  tartrate 75mg  BID - continue warfarin as directed  5: Aortic root dilatation- - Chest CTA angio 03/27/23: 4.0 cm fusiform dilatation of the ascending thoracic aorta. - yearly follow-up  6: Hyperlipidemia- - continue atorvastatin  40mg  daily - LDL 03/08/23 reviewed and was 54   He will continue close follow-up with PCP and cardiology in [place]. Was advised that he could return here for any worsening of edema, weight gain etc and he was comfortable with this plan.    Charlette Console, FNP 05/31/23

## 2023-06-03 ENCOUNTER — Encounter: Payer: Self-pay | Admitting: Family

## 2023-06-03 ENCOUNTER — Ambulatory Visit: Attending: Family | Admitting: Family

## 2023-06-03 VITALS — BP 147/86 | HR 50 | Wt 280.0 lb

## 2023-06-03 DIAGNOSIS — E119 Type 2 diabetes mellitus without complications: Secondary | ICD-10-CM | POA: Diagnosis not present

## 2023-06-03 DIAGNOSIS — I10 Essential (primary) hypertension: Secondary | ICD-10-CM | POA: Diagnosis not present

## 2023-06-03 DIAGNOSIS — R5383 Other fatigue: Secondary | ICD-10-CM | POA: Diagnosis present

## 2023-06-03 DIAGNOSIS — I110 Hypertensive heart disease with heart failure: Secondary | ICD-10-CM | POA: Insufficient documentation

## 2023-06-03 DIAGNOSIS — D649 Anemia, unspecified: Secondary | ICD-10-CM | POA: Insufficient documentation

## 2023-06-03 DIAGNOSIS — Z7984 Long term (current) use of oral hypoglycemic drugs: Secondary | ICD-10-CM | POA: Diagnosis not present

## 2023-06-03 DIAGNOSIS — Z7901 Long term (current) use of anticoagulants: Secondary | ICD-10-CM | POA: Diagnosis not present

## 2023-06-03 DIAGNOSIS — E1122 Type 2 diabetes mellitus with diabetic chronic kidney disease: Secondary | ICD-10-CM | POA: Diagnosis not present

## 2023-06-03 DIAGNOSIS — E785 Hyperlipidemia, unspecified: Secondary | ICD-10-CM | POA: Diagnosis not present

## 2023-06-03 DIAGNOSIS — D696 Thrombocytopenia, unspecified: Secondary | ICD-10-CM | POA: Diagnosis not present

## 2023-06-03 DIAGNOSIS — I77819 Aortic ectasia, unspecified site: Secondary | ICD-10-CM

## 2023-06-03 DIAGNOSIS — I77810 Thoracic aortic ectasia: Secondary | ICD-10-CM | POA: Insufficient documentation

## 2023-06-03 DIAGNOSIS — I4819 Other persistent atrial fibrillation: Secondary | ICD-10-CM | POA: Insufficient documentation

## 2023-06-03 DIAGNOSIS — N1831 Chronic kidney disease, stage 3a: Secondary | ICD-10-CM

## 2023-06-03 DIAGNOSIS — I5032 Chronic diastolic (congestive) heart failure: Secondary | ICD-10-CM | POA: Insufficient documentation

## 2023-06-03 MED ORDER — LOSARTAN POTASSIUM 100 MG PO TABS: 100.0000 mg | ORAL_TABLET | Freq: Every day | ORAL | 3 refills | Status: AC

## 2023-06-03 NOTE — Progress Notes (Signed)
 [facility] - HEART FAILURE CLINIC - PHARMACIST COUNSELING NOTE  Guideline-Directed Medical Therapy/Evidence Based Medicine - HFpEF  ACE/ARB/ARNI: Losartan  50 mg daily Beta Blocker: Metoprolol  tartrate 75 mg twice daily (afib) Aldosterone Antagonist: Spironolactone  25 mg daily Diuretic: Furosemide  20 daily  SGLT2i: Empagliflozin  10 mg daily  Adherence Assessment  Do you ever forget to take your medication? [] Yes [x] No  Do you ever skip doses due to side effects? [] Yes [x] No  Do you have trouble affording your medicines? [] Yes [x] No  Are you ever unable to pick up your medication due to transportation difficulties? [] Yes [x] No  Do you ever stop taking your medications because you don't believe they are helping? [] Yes [x] No  Do you check your weight daily? [x] Yes [] No   Adherence strategy: They have two pill boxes, two weeks at a time.   Barriers to obtaining medications: None identified  Vital signs: HR 50, BP 147/68, weight (pounds) 280 lbs PTA: around 275 lbs (up a little today)  GFR 48 05/29/23 (has not recovered since hospitalization with baseline > 60)  Echo 04/03/23: EF 55%, moderately reduced RV, trivial MR/ AR      Latest Ref Rng & Units 05/29/2023    8:09 AM 04/05/2023    3:29 AM 04/04/2023    4:25 AM  BMP  Glucose 70 - 99 mg/dL 829  562  130   BUN 8 - 23 mg/dL 24  29  23    Creatinine 0.61 - 1.24 mg/dL 8.65  7.84  6.96   Sodium 135 - 145 mmol/L 137  137  135   Potassium 3.5 - 5.1 mmol/L 4.3  4.0  3.7   Chloride 98 - 111 mmol/L 103  100  100   CO2 22 - 32 mmol/L 24  27  28    Calcium  8.9 - 10.3 mg/dL 9.2  8.6  8.8    Past Medical History:  Diagnosis Date   Atrial fibrillation (HCC)    Diagnosed 2020   Diverticula of intestine    DJD (degenerative joint disease)    Dysrhythmia    Essential hypertension    Gout    OSA (obstructive sleep apnea)    Intolerant of CPAP   Osteoarthritis    Polio    Type 2 diabetes mellitus (HCC)     ASSESSMENT Miguel Cox is a 73 year old male who presents to the HF clinic for their 1 month follow-up appointment. They have a past medical history significant for persistent A-fib, hypertension, type 2 diabetes, acquired thrombocytopenia, anemia, dilatation of aorta and chronic heart failure. Their medications are covered by [employer] grant approved 03/11/2023 - 03/09/2024. They were started on spironolactone  25/d at their previous visit.  They are currently on maximum GDMT for HFpEF. Most concerning issue is they have fluid retention (edema). They wear compression socks. Lasix  daily lasix  has been helping a lot. Their BP is not controlled at this time with 147/68. They are on losartan , amlodipine , and chlorthalidone.   In reviewing symptoms and potential medication side effects. Had a recent gout flare this last week. Reports they have 3 - 4 times per year, but haven't had an increase in gout episodes the last year when they started chorthalidone. Informed the patient of the risks that this medication can make their gout worse. They acknowledge but said they've been struggling with it consistently 40 years. Some fluid today, due to eating too big salty meals the last few days. They report no SOB, dizziness, chest pain, palpitations or fatigue.  Recent ED Visit (past 6 months):  -- Date - 04/02/2023, [facility] - Acute congestive heart failure   PLAN  Preventative  LDL 62 03/08/23 -- atorvastatin  40/d A1c 5.7 04/02/23 -- PCP started mounjaro  -- Empagliflozin  10 mg daily  Recommendations -- Consider increasing losartan  to 100 mg daily to help with BP management -- Monitor patient for increase gout flares and hypokalemia due to being on chlorthalidone and furosemide ; consider stopping and changing losartan  to entresto for increase BP control  -- keeping to help with fluid and BP management in absence of side effects.   -- Recent BMET shows potassium at 4.3 and renal function stable GFR 43  Thank you for  allowing pharmacy to participate in this patient's care.   Time spent: 15 minutes  Craven Do, PharmD Pharmacy Resident  06/03/2023 7:28 AM

## 2023-06-03 NOTE — Patient Instructions (Signed)
 Medication Changes:  INCREASE Losartan  100mg  (1 tab) daily   Follow-Up in: Please follow up with the Advanced Heart Failure Clinic as needed. You do not need to make an appointment today. Just give us  a call in order to make an appointment should you feel you need one.   At the Advanced Heart Failure Clinic, you and your health needs are our priority. We have a designated team specialized in the treatment of Heart Failure. This Care Team includes your primary Heart Failure Specialized Cardiologist (physician), Advanced Practice Providers (APPs- Physician Assistants and Nurse Practitioners), and Pharmacist who all work together to provide you with the care you need, when you need it.   You may see any of the following providers on your designated Care Team at your next follow up:  Dr. Jules Oar Dr. Peder Bourdon Dr. Alwin Baars Dr. Judyth Nunnery Shawnee Dellen, FNP Bevely Brush, RPH-CPP  Please be sure to bring in all your medications bottles to every appointment.   Need to Contact Us :  If you have any questions or concerns before your next appointment please send us  a message through [place] or call our office at [phone].    TO LEAVE A MESSAGE FOR THE NURSE SELECT OPTION 2, PLEASE LEAVE A MESSAGE INCLUDING: YOUR NAME DATE OF BIRTH CALL BACK NUMBER REASON FOR CALL**this is important as we prioritize the call backs  YOU WILL RECEIVE A CALL BACK THE SAME DAY AS LONG AS YOU CALL BEFORE 4:00 PM

## 2023-06-04 NOTE — H&P (Signed)
 Patient's anticipated LOS is less than 2 midnights, meeting these requirements: - Younger than 90 - Lives within 1 hour of care - Has a competent adult at home to recover with post-op recover - NO history of  - Chronic pain requiring opiods  - Diabetes  - Coronary Artery Disease  - Heart failure  - Heart attack  - Stroke  - DVT/VTE  - Cardiac arrhythmia  - Respiratory Failure/COPD  - Renal failure  - Anemia  - Advanced Liver disease     Miguel Cox is an 73 y.o. male.    Chief Complaint: left shoulder pain  HPI: Pt is a 73 y.o. male complaining of left shoulder pain for multiple years. Pain had continually increased since the beginning. X-rays in the clinic show end-stage arthritic changes of the left shoulder. Pt has tried various conservative treatments which have failed to alleviate their symptoms, including injections and therapy. Various options are discussed with the patient. Risks, benefits and expectations were discussed with the patient. Patient understand the risks, benefits and expectations and wishes to proceed with surgery.   PCP:  Miguel Bode, DO  D/C Plans: Home  PMH: Past Medical History:  Diagnosis Date   Atrial fibrillation (HCC)    Diagnosed 2020   Diverticula of intestine    DJD (degenerative joint disease)    Dysrhythmia    Essential hypertension    Gout    OSA (obstructive sleep apnea)    Intolerant of CPAP   Osteoarthritis    Polio    Type 2 diabetes mellitus (HCC)     PSH: Past Surgical History:  Procedure Laterality Date   removal of toe nail     age 40 for fungus   SHOULDER ARTHROSCOPY Right 2011   right   TOTAL HIP ARTHROPLASTY Right 07/27/2021   Procedure: TOTAL HIP ARTHROPLASTY ANTERIOR APPROACH;  Surgeon: Miguel Crew, MD;  Location: [facility];  Service: Orthopedics;  Laterality: Right;   TOTAL KNEE ARTHROPLASTY Left 01/29/2009   Dr. Leighton Cox    Social History:  reports that he has never smoked. He has never used smokeless  tobacco. He reports that he does not currently use alcohol. He reports that he does not use drugs. BMI: Estimated body mass index is 37.97 kg/m as calculated from the following:   Height as of 05/22/23: 6' (1.829 m).   Weight as of 06/03/23: 127 kg.  Lab Results  Component Value Date   ALBUMIN 3.5 04/02/2023   Diabetes:   Patient has a diagnosis of diabetes,  Lab Results  Component Value Date   HGBA1C 5.7 (H) 04/02/2023   Smoking Status:   reports that he has never smoked. He has never used smokeless tobacco.    Allergies:  Allergies  Allergen Reactions   Enalapril Cough   Penicillins Hives and Itching    Did it involve swelling of the face/tongue/throat, SOB, or low BP? No Did it involve sudden or severe rash/hives, skin peeling, or any reaction on the inside of your mouth or nose? No Did you need to seek medical attention at a hospital or doctor's office? No When did it last happen?       If all above answers are "NO", may proceed with cephalosporin use. Tolerated Cephalosporin Date: 07/27/21.     Cefazolin  Rash   Other Swelling and Rash    Pt can't eat anything from [facility], it causes swelling    Medications: No current facility-administered medications for this encounter.   Current Outpatient  Medications  Medication Sig Dispense Refill   amLODipine  (NORVASC ) 10 MG tablet Take 10 mg by mouth daily.     atorvastatin  (LIPITOR) 40 MG tablet Take 40 mg by mouth at bedtime.  3   chlorthalidone (HYGROTON) 25 MG tablet Take 25 mg by mouth daily.     colchicine  0.6 MG tablet Take 0.6 mg by mouth daily as needed (gout). Take 2 tablets and one hour later take a third tablet as needed for gout     cyclobenzaprine (FLEXERIL) 10 MG tablet Take 10 mg by mouth 3 (three) times daily as needed for muscle spasms.     empagliflozin  (JARDIANCE ) 10 MG TABS tablet Take 1 tablet (10 mg total) by mouth daily before breakfast. 30 tablet 6   EPINEPHrine  (EPIPEN  2-PAK) 0.3 mg/0.3 mL IJ  SOAJ injection Inject 0.3 mLs (0.3 mg total) into the muscle once. 1 Device 1   furosemide  (LASIX ) 20 MG tablet Take 1 tablet (20 mg total) by mouth daily. 30 tablet 11   [Paused] glipiZIDE  (GLUCOTROL  XL) 5 MG 24 hr tablet Take 5 mg by mouth daily.     Krill Oil 1000 MG CAPS Take 1,000 mg by mouth daily.      losartan  (COZAAR ) 100 MG tablet Take 1 tablet (100 mg total) by mouth daily. 90 tablet 3   Metoprolol  Tartrate 75 MG TABS Take 75 mg by mouth 2 (two) times daily.     Multiple Vitamin (MULTIVITAMIN WITH MINERALS) TABS tablet Take 1 tablet by mouth daily.     spironolactone  (ALDACTONE ) 25 MG tablet Take 1 tablet (25 mg total) by mouth daily. 90 tablet 3   warfarin (COUMADIN ) 3 MG tablet TAKE 2 AND 1/2 TABLETS DAILY EXCEPT 3 TABLETS ON MONDAYS OR AS DIRECTED BY COUMADIN  CLINIC (Patient taking differently: Take 7.5 mg by mouth daily.) 230 tablet 1   sildenafil (VIAGRA) 100 MG tablet Take 1 tablet by mouth daily as needed.      No results found for this or any previous visit (from the past 48 hours). No results found.  ROS: Pain with rom of the left upper extremity  Physical Exam: Alert and oriented 73 y.o. male in no acute distress Cranial nerves 2-12 intact Cervical spine: full rom with no tenderness, nv intact distally Chest: active breath sounds bilaterally, no wheeze rhonchi or rales Heart: regular rate and rhythm, no murmur Abd: non tender non distended with active bowel sounds Hip is stable with rom  Left shoulder painful and weak rom Nv intact distally No rashes or edema distally  Assessment/Plan Assessment: left shoulder cuff arthropathy  Plan:  Patient will undergo a left reverse total shoulder by Miguel Cox at [facility] Risks benefits and expectations were discussed with the patient. Patient understand risks, benefits and expectations and wishes to proceed. Preoperative templating of the joint replacement has been completed, documented, and submitted to the Operating Room  personnel in order to optimize intra-operative equipment management.   Miguel Roosevelt PA-C, MPAS [facility] is now [name] [address], [place], [place] [address] Phone: [phone] [email or web address] Facebook  [employer]

## 2023-06-04 NOTE — Patient Instructions (Signed)
 SURGICAL WAITING ROOM VISITATION  Patients having surgery or a procedure may have no more than 2 support people in the waiting area - these visitors may rotate.    Children under the age of 1 must have an adult with them who is not the patient.  Due to an increase in RSV and influenza rates and associated hospitalizations, children ages 28 and under may not visit patients in [facility] hospitals.  Visitors with respiratory illnesses are discouraged from visiting and should remain at home.  If the patient needs to stay at the hospital during part of their recovery, the visitor guidelines for inpatient rooms apply. Pre-op nurse will coordinate an appropriate time for 1 support person to accompany patient in pre-op.  This support person may not rotate.    Please refer to the [facility] website for the visitor guidelines for Inpatients (after your surgery is over and you are in a regular room).       Your procedure is scheduled on: 06/12/23   Report to [facility] Main Entrance    Report to admitting at 12:30 PM   Call this number if you have problems the morning of surgery [phone]   Do not eat food :After Midnight.   After Midnight you may have the following liquids until ______ AM/ PM DAY OF SURGERY  Water  Non-Citrus Juices (without pulp, NO RED-Apple, White grape, White cranberry) Black Coffee (NO MILK/CREAM OR CREAMERS, sugar ok)  Clear Tea (NO MILK/CREAM OR CREAMERS, sugar ok) regular and decaf                             Plain Jell-O (NO RED)                                           Fruit ices (not with fruit pulp, NO RED)                                     Popsicles (NO RED)                                                               Sports drinks like Gatorade (NO RED)                    The day of surgery:  Drink ONE (1) Pre-Surgery or G2 at AM the morning of surgery. Drink in one sitting. Do not sip.  This drink was given to you during your  hospital  pre-op appointment visit. Nothing else to drink after completing the  Pre-Surgery  G2.   Oral Hygiene is also important to reduce your risk of infection.                                    Remember - BRUSH YOUR TEETH THE MORNING OF SURGERY WITH YOUR REGULAR TOOTHPASTE  DENTURES WILL BE REMOVED PRIOR TO SURGERY PLEASE DO NOT APPLY "Poly grip" OR ADHESIVES!!!   Stop all  vitamins and herbal supplements 7 days before surgery.   Take these medicines the morning of surgery with A SIP OF WATER : Amlodipine , atorvastatin , hygroton(chlorthalidone), colchicine , Metoprolol , Spironolactone .                           Do not take Lasix  or Losartan  the morning of surgery.               DO NOT TAKE ANY ORAL DIABETIC MEDICATIONS DAY OF YOUR SURGERY Do not take Jardiance  for 72 hours prior to surgery. Last dose ______________ Hold Glipizide  the morning of surgery.  Bring CPAP mask and tubing day of surgery.                              You may not have any metal on your body including hair pins, jewelry, and body piercing             Do not wear make-up, lotions, powders, perfumes/cologne, or deodorant              Men may shave face and neck.   Do not bring valuables to the hospital. [facility] IS NOT             RESPONSIBLE   FOR VALUABLES.   Contacts, glasses, dentures or bridgework may not be worn into surgery.   Bring small overnight bag day of surgery.   DO NOT BRING YOUR HOME MEDICATIONS TO THE HOSPITAL. PHARMACY WILL DISPENSE MEDICATIONS LISTED ON YOUR MEDICATION LIST TO YOU DURING YOUR ADMISSION IN THE HOSPITAL!    Patients discharged on the day of surgery will not be allowed to drive home.  Someone NEEDS to stay with you for the first 24 hours after anesthesia.   Special Instructions: Bring a copy of your healthcare power of attorney and living will documents the day of surgery if you haven't scanned them before.              Please read over the following fact sheets you  were given: IF YOU HAVE QUESTIONS ABOUT YOUR PRE-OP INSTRUCTIONS PLEASE CALL [phone] Ammon Bales   If you received a COVID test during your pre-op visit  it is requested that you wear a mask when out in public, stay away from anyone that may not be feeling well and notify your surgeon if you develop symptoms. If you test positive for Covid or have been in contact with anyone that has tested positive in the last 10 days please notify you surgeon.      Pre-operative 5 CHG Bath Instructions   You can play a key role in reducing the risk of infection after surgery. Your skin needs to be as free of germs as possible. You can reduce the number of germs on your skin by washing with CHG (chlorhexidine  gluconate) soap before surgery. CHG is an antiseptic soap that kills germs and continues to kill germs even after washing.   DO NOT use if you have an allergy to chlorhexidine /CHG or antibacterial soaps. If your skin becomes reddened or irritated, stop using the CHG and notify one of our RNs at [phone].   Please shower with the CHG soap starting 4 days before surgery using the following schedule:     Please keep in mind the following:  DO NOT shave, including legs and underarms, starting the day of your first shower.   You may shave your face  at any point before/day of surgery.  Place clean sheets on your bed the day you start using CHG soap. Use a clean washcloth (not used since being washed) for each shower. DO NOT sleep with pets once you start using the CHG.   CHG Shower Instructions:  If you choose to wash your hair and private area, wash first with your normal shampoo/soap.  After you use shampoo/soap, rinse your hair and body thoroughly to remove shampoo/soap residue.  Turn the water  OFF and apply about 3 tablespoons (45 ml) of CHG soap to a CLEAN washcloth.  Apply CHG soap ONLY FROM YOUR NECK DOWN TO YOUR TOES (washing for 3-5 minutes)  DO NOT use CHG soap on face, private areas, open  wounds, or sores.  Pay special attention to the area where your surgery is being performed.  If you are having back surgery, having someone wash your back for you may be helpful. Wait 2 minutes after CHG soap is applied, then you may rinse off the CHG soap.  Pat dry with a clean towel  Put on clean clothes/pajamas   If you choose to wear lotion, please use ONLY the CHG-compatible lotions on the back of this paper.     Additional instructions for the day of surgery: DO NOT APPLY any lotions, deodorants, cologne, or perfumes.   Put on clean/comfortable clothes.  Brush your teeth.  Ask your nurse before applying any prescription medications to the skin.      CHG Compatible Lotions   Aveeno Moisturizing lotion  Cetaphil Moisturizing Cream  Cetaphil Moisturizing Lotion  Clairol Herbal Essence Moisturizing Lotion, Dry Skin  Clairol Herbal Essence Moisturizing Lotion, Extra Dry Skin  Clairol Herbal Essence Moisturizing Lotion, Normal Skin  Curel Age Defying Therapeutic Moisturizing Lotion with Alpha Hydroxy  Curel Extreme Care Body Lotion  Curel Soothing Hands Moisturizing Hand Lotion  Curel Therapeutic Moisturizing Cream, Fragrance-Free  Curel Therapeutic Moisturizing Lotion, Fragrance-Free  Curel Therapeutic Moisturizing Lotion, Original Formula  Eucerin Daily Replenishing Lotion  Eucerin Dry Skin Therapy Plus Alpha Hydroxy Crme  Eucerin Dry Skin Therapy Plus Alpha Hydroxy Lotion  Eucerin Original Crme  Eucerin Original Lotion  Eucerin Plus Crme Eucerin Plus Lotion  Eucerin TriLipid Replenishing Lotion  Keri Anti-Bacterial Hand Lotion  Keri Deep Conditioning Original Lotion Dry Skin Formula Softly Scented  Keri Deep Conditioning Original Lotion, Fragrance Free Sensitive Skin Formula  Keri Lotion Fast Absorbing Fragrance Free Sensitive Skin Formula  Keri Lotion Fast Absorbing Softly Scented Dry Skin Formula  Keri Original Lotion  Keri Skin Renewal Lotion Keri Silky Smooth  Lotion  Keri Silky Smooth Sensitive Skin Lotion  Nivea Body Creamy Conditioning Oil  Nivea Body Extra Enriched Lotion  Nivea Body Original Lotion  Nivea Body Sheer Moisturizing Lotion Nivea Crme  Nivea Skin Firming Lotion  NutraDerm 30 Skin Lotion  NutraDerm Skin Lotion  NutraDerm Therapeutic Skin Cream  NutraDerm Therapeutic Skin Lotion  ProShield Protective Hand Cream       How to Manage Your Diabetes Before and After Surgery  Why is it important to control my blood sugar before and after surgery? Improving blood sugar levels before and after surgery helps healing and can limit problems. A way of improving blood sugar control is eating a healthy diet by:  Eating less sugar and carbohydrates  Increasing activity/exercise  Talking with your doctor about reaching your blood sugar goals High blood sugars (greater than 180 mg/dL) can raise your risk of infections and slow your recovery, so you will need  to focus on controlling your diabetes during the weeks before surgery. Make sure that the doctor who takes care of your diabetes knows about your planned surgery including the date and location.  How do I manage my blood sugar before surgery? Check your blood sugar at least 4 times a day, starting 2 days before surgery, to make sure that the level is not too high or low. Check your blood sugar the morning of your surgery when you wake up and every 2 hours until you get to the Short Stay unit. If your blood sugar is less than 70 mg/dL, you will need to treat for low blood sugar: Do not take insulin . Treat a low blood sugar (less than 70 mg/dL) with  cup of clear juice (cranberry or apple), 4 glucose tablets, OR glucose gel. Recheck blood sugar in 15 minutes after treatment (to make sure it is greater than 70 mg/dL). If your blood sugar is not greater than 70 mg/dL on recheck, call [phone] for further instructions. Report your blood sugar to the short stay nurse when you get to Short  Stay.  If you are admitted to the hospital after surgery: Your blood sugar will be checked by the staff and you will probably be given insulin  after surgery (instead of oral diabetes medicines) to make sure you have good blood sugar levels. The goal for blood sugar control after surgery is 80-180 mg/dL.   WHAT DO I DO ABOUT MY DIABETES MEDICATION?  Do not take oral diabetes medicines (pills) the morning of surgery.   DO NOT TAKE THE FOLLOWING 7 DAYS PRIOR TO SURGERY: Ozempic, Wegovy, Rybelsus (Semaglutide), Byetta (exenatide), Bydureon (exenatide ER), Victoza, Saxenda (liraglutide), or Trulicity (dulaglutide) Mounjaro (Tirzepatide) Adlyxin (Lixisenatide), Polyethylene Glycol Loxenatide.  For patients with insulin  pumps: Contact your diabetes doctor for specific instructions before surgery. Decrease basal rates by 20% at midnight the night before your surgery. Note that if your surgery is planned to be longer than 2 hours, your insulin  pump will be removed and intravenous (IV) insulin  will be started and managed by the nurses and the anesthesiologist. You will be able to restart your insulin  pump once you are awake and able to manage it.  Make sure to bring insulin  pump supplies to the hospital with you in case the  site needs to be changed.  Patient Signature:  Date:   Nurse Signature:  Date:

## 2023-06-04 NOTE — Progress Notes (Signed)
 Request for pre op orders sent to Dr. Winston Hawking.

## 2023-06-04 NOTE — Progress Notes (Signed)
 COVID Vaccine received:  []  No []  Yes Date of any COVID positive Test in last 90 days:  PCP - Avelina Bode DO Cardiologist - Vishnu Mallipeddi MD  Chest x-ray - 04/03/23 Epic EKG -  05/22/23 Epic Stress Test -  ECHO - 04/03/23 Epic Cardiac Cath -   Cardiac clearance 05/22/23 Odetta Benes PA-C  Bowel Prep - []  No  []   Yes ______  Pacemaker / ICD device []  No []  Yes   Spinal Cord Stimulator:[]  No []  Yes       History of Sleep Apnea? []  No []  Yes   CPAP used?- []  No []  Yes    Does the patient monitor blood sugar?          []  No []  Yes  []  N/A  Patient has: []  NO Hx DM   []  Pre-DM                 []  DM1  []   DM2 Does patient have a [employer] or Dexacom? []  No []  Yes   Fasting Blood Sugar Ranges-  Checks Blood Sugar _____ times a day  GLP1 agonist / usual dose -  GLP1 instructions:  SGLT-2 inhibitors / usual dose -  SGLT-2 instructions:   Blood Thinner / Instructions:Coumadin  daily. Hold x 5 days prior to surgery. Aspirin  Instructions:  Comments:   Activity level: Patient is able / unable to climb a flight of stairs without difficulty; []  No CP  []  No SOB, but would have ___   Patient can / can not perform ADLs without assistance.   Anesthesia review:   Patient denies shortness of breath, fever, cough and chest pain at PAT appointment.  Patient verbalized understanding and agreement to the Pre-Surgical Instructions that were given to them at this PAT appointment. Patient was also educated of the need to review these PAT instructions again prior to his/her surgery.I reviewed the appropriate phone numbers to call if they have any and questions or concerns.

## 2023-06-05 ENCOUNTER — Ambulatory Visit: Attending: Internal Medicine | Admitting: *Deleted

## 2023-06-05 DIAGNOSIS — I4819 Other persistent atrial fibrillation: Secondary | ICD-10-CM

## 2023-06-05 DIAGNOSIS — Z5181 Encounter for therapeutic drug level monitoring: Secondary | ICD-10-CM

## 2023-06-05 LAB — POCT INR: INR: 2.3 (ref 2.0–3.0)

## 2023-06-05 NOTE — Patient Instructions (Signed)
 Continue warfarin 2 1/2 tablets daily except 2 tablets on Mondays. Continue taking warfarin at night Pending Lt shoulder surgery on 5/14. Will hold warfarin 5 days before procedure.  Does Not need Loveno.   Take last dose of warfarin 5/8 and resume above schedule night of procedure if OK with MD. Be consistent with Vit K foods Recheck 7-10 days after procedure

## 2023-06-06 ENCOUNTER — Encounter (HOSPITAL_COMMUNITY): Payer: Self-pay

## 2023-06-06 ENCOUNTER — Other Ambulatory Visit: Payer: Self-pay

## 2023-06-06 ENCOUNTER — Encounter (HOSPITAL_COMMUNITY)
Admission: RE | Admit: 2023-06-06 | Discharge: 2023-06-06 | Disposition: A | Discharge status: Home / Self Care | Source: Ambulatory Visit | Attending: Orthopedic Surgery | Admitting: Orthopedic Surgery

## 2023-06-06 ENCOUNTER — Ambulatory Visit: Admitting: Nurse Practitioner

## 2023-06-06 VITALS — BP 135/74 | HR 48 | Temp 98.2°F | Resp 16 | Ht 72.0 in | Wt 270.6 lb

## 2023-06-06 DIAGNOSIS — I509 Heart failure, unspecified: Secondary | ICD-10-CM | POA: Insufficient documentation

## 2023-06-06 DIAGNOSIS — Z7984 Long term (current) use of oral hypoglycemic drugs: Secondary | ICD-10-CM | POA: Diagnosis not present

## 2023-06-06 DIAGNOSIS — Z7901 Long term (current) use of anticoagulants: Secondary | ICD-10-CM | POA: Insufficient documentation

## 2023-06-06 DIAGNOSIS — Z01812 Encounter for preprocedural laboratory examination: Secondary | ICD-10-CM | POA: Diagnosis present

## 2023-06-06 DIAGNOSIS — G4733 Obstructive sleep apnea (adult) (pediatric): Secondary | ICD-10-CM | POA: Insufficient documentation

## 2023-06-06 DIAGNOSIS — N1831 Chronic kidney disease, stage 3a: Secondary | ICD-10-CM | POA: Insufficient documentation

## 2023-06-06 DIAGNOSIS — M19012 Primary osteoarthritis, left shoulder: Secondary | ICD-10-CM | POA: Insufficient documentation

## 2023-06-06 DIAGNOSIS — E1122 Type 2 diabetes mellitus with diabetic chronic kidney disease: Secondary | ICD-10-CM | POA: Insufficient documentation

## 2023-06-06 DIAGNOSIS — I130 Hypertensive heart and chronic kidney disease with heart failure and stage 1 through stage 4 chronic kidney disease, or unspecified chronic kidney disease: Secondary | ICD-10-CM | POA: Diagnosis not present

## 2023-06-06 DIAGNOSIS — Z01818 Encounter for other preprocedural examination: Secondary | ICD-10-CM

## 2023-06-06 DIAGNOSIS — I4891 Unspecified atrial fibrillation: Secondary | ICD-10-CM | POA: Diagnosis not present

## 2023-06-06 HISTORY — DX: Personal history of urinary calculi: Z87.442

## 2023-06-06 LAB — HEMOGLOBIN A1C
Hgb A1c MFr Bld: 7.2 % — ABNORMAL HIGH (ref 4.8–5.6)
Mean Plasma Glucose: 159.94 mg/dL

## 2023-06-06 LAB — SURGICAL PCR SCREEN
MRSA, PCR: NEGATIVE
Staphylococcus aureus: NEGATIVE

## 2023-06-06 LAB — GLUCOSE, CAPILLARY: Glucose-Capillary: 130 mg/dL — ABNORMAL HIGH (ref 70–99)

## 2023-06-06 NOTE — Progress Notes (Signed)
 COVID Vaccine Completed:  Date of COVID positive in last 90 days:  PCP - [facility] in [place] Cardiologist - Vishnu Mallipeddi MD   Chest x-ray - 04/03/23 Epic EKG -  05/22/23 Epic Stress Test - N/A ECHO - 04/03/23 Epic Cardiac Cath - N/A Pacemaker/ICD device last checked: Spinal Cord Stimulator:  N/A  Cardiac clearance 05/22/23 Odetta Benes PA-C   Bowel Prep - N/A  Sleep Study -  Pt states never had a sleep study CPAP - No  Fasting Blood Sugar - 119 to 130 Checks Blood Sugar - 1 times a day  Last dose of GLP1 agonist-  N/A GLP1 instructions:  Hold 7 days before surgery    Jardiance  Last dose of SGLT-2 inhibitors-  06-08-23 SGLT-2 instructions:  Hold 3 days before surgery    Blood Thinner Instructions:  Coumadin   Last dose:  06-06-23 Time: 8 PM.  Patient aware to hold for 5 days Aspirin  Instructions: N/A Last Dose:  Activity level:  Can go up a flight of stairs and perform activities of daily living without stopping and without symptoms of chest pain or shortness of breath.  Anesthesia review:  Afib, HTN, DM, OSA, dilation aorta, CHF.  Stop Bang 5  Patient denies shortness of breath, fever, cough and chest pain at PAT appointment  Patient verbalized understanding of instructions that were given to them at the PAT appointment. Patient was also instructed that they will need to review over the PAT instructions again at home before surgery.

## 2023-06-07 NOTE — Anesthesia Preprocedure Evaluation (Signed)
 Anesthesia Evaluation    Reviewed: Allergy & Precautions, Patient's Chart, lab work & pertinent test results, reviewed documented beta blocker date and time   History of Anesthesia Complications Negative for: history of anesthetic complications  Airway Mallampati: II  TM Distance: >3 FB Neck ROM: Full    Dental  (+) Edentulous Lower, Missing, Chipped,    Pulmonary sleep apnea    Pulmonary exam normal        Cardiovascular hypertension, Pt. on medications and Pt. on home beta blockers Normal cardiovascular exam     Neuro/Psych negative neurological ROS     GI/Hepatic negative GI ROS, Neg liver ROS,,,  Endo/Other  diabetes, Type 2, Oral Hypoglycemic Agents    Renal/GU negative Renal ROS  negative genitourinary   Musculoskeletal  (+) Arthritis ,    Abdominal   Peds  Hematology negative hematology ROS (+)   Anesthesia Other Findings Day of surgery medications reviewed with patient.  Reproductive/Obstetrics negative OB ROS                              Anesthesia Physical Anesthesia Plan  ASA: 3  Anesthesia Plan: General   Post-op Pain Management: Tylenol  PO (pre-op)* and Regional block*   Induction: Intravenous  PONV Risk Score and Plan: 2 and Treatment may vary due to age or medical condition, Ondansetron  and Dexamethasone   Airway Management Planned: Oral ETT  Additional Equipment: None  Intra-op Plan:   Post-operative Plan: Extubation in OR  Informed Consent: I have reviewed the patients History and Physical, chart, labs and discussed the procedure including the risks, benefits and alternatives for the proposed anesthesia with the patient or authorized representative who has indicated his/her understanding and acceptance.     Dental advisory given  Plan Discussed with: CRNA  Anesthesia Plan Comments: (See PAT note 06/06/2023)        Anesthesia Quick Evaluation

## 2023-06-07 NOTE — Progress Notes (Signed)
 Anesthesia Chart Review   Case: [id number] Date/Time: 06/12/23 1441   Procedure: ARTHROPLASTY, SHOULDER, TOTAL, REVERSE (Left: Shoulder)   Anesthesia type: Choice   Pre-op diagnosis: Left shoulder rotator cuff arthropathy   Location: [facility] ROOM 06 / [facility]   Surgeons: Miguel Hawking, MD       DISCUSSION:73 y.o. never smoker with h/o HTN, OSA, CHF, atrial fibrillation, DM II, left shoulder rotator cuff arthropathy scheduled for above procedure 06/12/2023 with Dr. Winston Cox.   Pt with recent admission 3/4-04/05/2023 with heart failure exacerbation.   Pt seen by cardiology 05/22/2023 for preoperative evaluation. Per OV note, "Revised Cardiac Risk Index (RCRI) with 1 point and perioperative risk of major cardiac events is 0.9 %.  Duke Activity Status Index (DASI) with > 4 mets .  From cardiac standpoint, this patient has a low cardiac risk. Therefore, based on ACC/AHA guidelines, patient at acceptable risk for the planned procedure without further cardiovascular testing as long as pending lab work is normal.  Hold Warfarin for 5 days prior to procedure per Pharmacy."  Seen in heart failure clinic 06/03/2023. Euvolemic at this visit.  VS: BP 135/74   Pulse (!) 48   Temp 36.8 C (Oral)   Resp 16   Ht 6' (1.829 m)   Wt 122.7 kg   SpO2 100%   BMI 36.70 kg/m   PROVIDERS: Miguel Cox, [facility] is PCP   Cardiologist: Miguel Pointer, MD  LABS: Labs reviewed: Acceptable for surgery. (all labs ordered are listed, but only abnormal results are displayed)  Labs Reviewed  HEMOGLOBIN A1C - Abnormal; Notable for the following components:      Result Value   Hgb A1c MFr Bld 7.2 (*)    All other components within normal limits  GLUCOSE, CAPILLARY - Abnormal; Notable for the following components:   Glucose-Capillary 130 (*)    All other components within normal limits  SURGICAL PCR SCREEN     IMAGES:   EKG:   CV: Echo 04/03/2023 1. Poor acoustic windows limit study, even with  Defininty RV difficult to  see.   2. Left ventricular ejection fraction, by estimation, is 55%. The left  ventricular internal cavity size was mildly dilated. Left ventricular  diastolic parameters are indeterminate.   3. Right ventricular systolic function is moderately reduced. The right  ventricular size is mildly enlarged.   4. The mitral valve is normal in structure. Trivial mitral valve  regurgitation.   5. The aortic valve is tricuspid. Aortic valve regurgitation is trivial.  Aortic valve sclerosis/calcification is present, without any evidence of  aortic stenosis.   6. The inferior vena cava is dilated in size with >50% respiratory  variability, suggesting right atrial pressure of 8 mmHg.  Past Medical History:  Diagnosis Date   Atrial fibrillation (HCC)    Diagnosed 2020   Diverticula of intestine    DJD (degenerative joint disease)    Dysrhythmia    Essential hypertension    Gout    History of kidney stones    OSA (obstructive sleep apnea)    Pt denies, Stop Bang 5   Osteoarthritis    Polio    Type 2 diabetes mellitus (HCC)     Past Surgical History:  Procedure Laterality Date   removal of toe nail     age 52 for fungus   SHOULDER ARTHROSCOPY Right 2011   right   TOTAL HIP ARTHROPLASTY Right 07/27/2021   Procedure: TOTAL HIP ARTHROPLASTY ANTERIOR APPROACH;  Surgeon: Miguel Crew, MD;  Location: [facility];  Service: Orthopedics;  Laterality: Right;   TOTAL KNEE ARTHROPLASTY Left 01/29/2009   Dr. Leighton Cox    MEDICATIONS:  amLODipine  (NORVASC ) 10 MG tablet   atorvastatin  (LIPITOR) 40 MG tablet   chlorthalidone (HYGROTON) 25 MG tablet   colchicine  0.6 MG tablet   cyclobenzaprine (FLEXERIL) 10 MG tablet   empagliflozin  (JARDIANCE ) 10 MG TABS tablet   EPINEPHrine  (EPIPEN  2-PAK) 0.3 mg/0.3 mL IJ SOAJ injection   furosemide  (LASIX ) 20 MG tablet   [Paused] glipiZIDE  (GLUCOTROL  XL) 5 MG 24 hr tablet   Krill Oil 1000 MG CAPS   losartan  (COZAAR ) 100 MG tablet    Metoprolol  Tartrate 75 MG TABS   Multiple Vitamin (MULTIVITAMIN WITH MINERALS) TABS tablet   sildenafil (VIAGRA) 100 MG tablet   spironolactone  (ALDACTONE ) 25 MG tablet   warfarin (COUMADIN ) 3 MG tablet   No current facility-administered medications for this encounter.     Miguel Cotton Ward, PA-C [facility] Pre-Surgical Testing [phone]

## 2023-06-12 ENCOUNTER — Ambulatory Visit (HOSPITAL_COMMUNITY): Payer: Self-pay | Admitting: Physician Assistant

## 2023-06-12 ENCOUNTER — Other Ambulatory Visit: Payer: Self-pay

## 2023-06-12 ENCOUNTER — Observation Stay (HOSPITAL_COMMUNITY)

## 2023-06-12 ENCOUNTER — Ambulatory Visit (HOSPITAL_COMMUNITY): Admitting: Anesthesiology

## 2023-06-12 ENCOUNTER — Encounter (HOSPITAL_COMMUNITY)
Admission: RE | Disposition: A | Discharge status: Home / Self Care | Payer: Self-pay | Source: Home / Self Care | Attending: Orthopedic Surgery

## 2023-06-12 ENCOUNTER — Observation Stay (HOSPITAL_COMMUNITY)
Admission: RE | Admit: 2023-06-12 | Discharge: 2023-06-13 | Disposition: A | Discharge status: Home / Self Care | Attending: Orthopedic Surgery | Admitting: Orthopedic Surgery

## 2023-06-12 ENCOUNTER — Encounter (HOSPITAL_COMMUNITY): Payer: Self-pay | Admitting: Orthopedic Surgery

## 2023-06-12 DIAGNOSIS — I110 Hypertensive heart disease with heart failure: Secondary | ICD-10-CM | POA: Diagnosis not present

## 2023-06-12 DIAGNOSIS — Z96652 Presence of left artificial knee joint: Secondary | ICD-10-CM | POA: Diagnosis not present

## 2023-06-12 DIAGNOSIS — E119 Type 2 diabetes mellitus without complications: Secondary | ICD-10-CM

## 2023-06-12 DIAGNOSIS — M19012 Primary osteoarthritis, left shoulder: Principal | ICD-10-CM | POA: Insufficient documentation

## 2023-06-12 DIAGNOSIS — I10 Essential (primary) hypertension: Secondary | ICD-10-CM | POA: Insufficient documentation

## 2023-06-12 DIAGNOSIS — M12812 Other specific arthropathies, not elsewhere classified, left shoulder: Secondary | ICD-10-CM | POA: Diagnosis not present

## 2023-06-12 DIAGNOSIS — Z96612 Presence of left artificial shoulder joint: Principal | ICD-10-CM

## 2023-06-12 DIAGNOSIS — I5022 Chronic systolic (congestive) heart failure: Secondary | ICD-10-CM | POA: Diagnosis not present

## 2023-06-12 DIAGNOSIS — E1122 Type 2 diabetes mellitus with diabetic chronic kidney disease: Secondary | ICD-10-CM

## 2023-06-12 DIAGNOSIS — Z79899 Other long term (current) drug therapy: Secondary | ICD-10-CM | POA: Insufficient documentation

## 2023-06-12 DIAGNOSIS — I4891 Unspecified atrial fibrillation: Secondary | ICD-10-CM | POA: Diagnosis not present

## 2023-06-12 DIAGNOSIS — Z7901 Long term (current) use of anticoagulants: Secondary | ICD-10-CM | POA: Insufficient documentation

## 2023-06-12 DIAGNOSIS — Z96641 Presence of right artificial hip joint: Secondary | ICD-10-CM | POA: Insufficient documentation

## 2023-06-12 DIAGNOSIS — M75102 Unspecified rotator cuff tear or rupture of left shoulder, not specified as traumatic: Secondary | ICD-10-CM | POA: Insufficient documentation

## 2023-06-12 HISTORY — PX: REVERSE SHOULDER ARTHROPLASTY: SHX5054

## 2023-06-12 LAB — GLUCOSE, CAPILLARY
Glucose-Capillary: 148 mg/dL — ABNORMAL HIGH (ref 70–99)
Glucose-Capillary: 139 mg/dL — ABNORMAL HIGH (ref 70–99)
Glucose-Capillary: 139 mg/dL — ABNORMAL HIGH (ref 70–99)
Glucose-Capillary: 227 mg/dL — ABNORMAL HIGH (ref 70–99)

## 2023-06-12 LAB — PROTIME-INR
INR: 1.1 (ref 0.8–1.2)
Prothrombin Time: 14.4 s (ref 11.4–15.2)

## 2023-06-12 LAB — APTT: aPTT: 27 s (ref 24–36)

## 2023-06-12 SURGERY — ARTHROPLASTY, SHOULDER, TOTAL, REVERSE
Anesthesia: General | Site: Shoulder | Laterality: Left

## 2023-06-12 MED ORDER — MIDAZOLAM HCL 2 MG/2ML IJ SOLN
2.0000 mg | Freq: Once | INTRAMUSCULAR | Status: AC
  Administered 2023-06-12: 2 mg via INTRAVENOUS
  Filled 2023-06-12: qty 2

## 2023-06-12 MED ORDER — LIDOCAINE HCL (CARDIAC) PF 100 MG/5ML IV SOSY
PREFILLED_SYRINGE | INTRAVENOUS | Status: DC | PRN
  Administered 2023-06-12: 60 mg via INTRAVENOUS

## 2023-06-12 MED ORDER — INSULIN ASPART 100 UNIT/ML IJ SOLN
6.0000 [IU] | Freq: Three times a day (TID) | INTRAMUSCULAR | Status: DC
  Administered 2023-06-13: 6 [IU] via SUBCUTANEOUS

## 2023-06-12 MED ORDER — LOSARTAN POTASSIUM 50 MG PO TABS
100.0000 mg | ORAL_TABLET | Freq: Every day | ORAL | Status: DC
  Administered 2023-06-13: 100 mg via ORAL
  Filled 2023-06-12: qty 2

## 2023-06-12 MED ORDER — PHENYLEPHRINE HCL-NACL 20-0.9 MG/250ML-% IV SOLN
INTRAVENOUS | Status: DC | PRN
  Administered 2023-06-12: 30 ug/min via INTRAVENOUS

## 2023-06-12 MED ORDER — HYDROMORPHONE HCL 1 MG/ML IJ SOLN
INTRAMUSCULAR | Status: AC
  Filled 2023-06-12: qty 1

## 2023-06-12 MED ORDER — TRANEXAMIC ACID-NACL 1000-0.7 MG/100ML-% IV SOLN
1000.0000 mg | Freq: Once | INTRAVENOUS | Status: AC
  Administered 2023-06-12: 1000 mg via INTRAVENOUS
  Filled 2023-06-12: qty 100

## 2023-06-12 MED ORDER — ONDANSETRON HCL 4 MG/2ML IJ SOLN
INTRAMUSCULAR | Status: AC
  Filled 2023-06-12: qty 2

## 2023-06-12 MED ORDER — FENTANYL CITRATE (PF) 100 MCG/2ML IJ SOLN
INTRAMUSCULAR | Status: DC | PRN
  Administered 2023-06-12: 25 ug via INTRAVENOUS
  Administered 2023-06-12: 75 ug via INTRAVENOUS

## 2023-06-12 MED ORDER — WARFARIN SODIUM 6 MG PO TABS: 6.0000 mg | ORAL_TABLET | ORAL | Status: DC

## 2023-06-12 MED ORDER — METOCLOPRAMIDE HCL 5 MG/ML IJ SOLN: 5.0000 mg | Freq: Three times a day (TID) | INTRAMUSCULAR | Status: DC | PRN

## 2023-06-12 MED ORDER — CEFAZOLIN SODIUM-DEXTROSE 2-4 GM/100ML-% IV SOLN
2.0000 g | Freq: Four times a day (QID) | INTRAVENOUS | Status: AC
  Administered 2023-06-12 – 2023-06-13 (×2): 2 g via INTRAVENOUS
  Filled 2023-06-12 (×2): qty 100

## 2023-06-12 MED ORDER — SUGAMMADEX SODIUM 200 MG/2ML IV SOLN
INTRAVENOUS | Status: DC | PRN
  Administered 2023-06-12: 50 mg via INTRAVENOUS
  Administered 2023-06-12: 200 mg via INTRAVENOUS

## 2023-06-12 MED ORDER — OXYCODONE HCL 5 MG/5ML PO SOLN: 5.0000 mg | Freq: Once | ORAL | Status: DC | PRN

## 2023-06-12 MED ORDER — CYCLOBENZAPRINE HCL 10 MG PO TABS
10.0000 mg | ORAL_TABLET | Freq: Three times a day (TID) | ORAL | Status: DC | PRN
  Administered 2023-06-12 – 2023-06-13 (×2): 10 mg via ORAL
  Filled 2023-06-12 (×2): qty 1

## 2023-06-12 MED ORDER — TRANEXAMIC ACID-NACL 1000-0.7 MG/100ML-% IV SOLN
1000.0000 mg | INTRAVENOUS | Status: AC
  Administered 2023-06-12: 1000 mg via INTRAVENOUS
  Filled 2023-06-12: qty 100

## 2023-06-12 MED ORDER — INSULIN ASPART 100 UNIT/ML IJ SOLN
0.0000 [IU] | Freq: Three times a day (TID) | INTRAMUSCULAR | Status: DC
  Administered 2023-06-13: 3 [IU] via SUBCUTANEOUS

## 2023-06-12 MED ORDER — ONDANSETRON HCL 4 MG PO TABS: 4.0000 mg | ORAL_TABLET | Freq: Three times a day (TID) | ORAL | 1 refills | Status: AC | PRN

## 2023-06-12 MED ORDER — SODIUM CHLORIDE 0.9% FLUSH
3.0000 mL | Freq: Two times a day (BID) | INTRAVENOUS | Status: DC
  Administered 2023-06-13: 10 mL via INTRAVENOUS

## 2023-06-12 MED ORDER — EMPAGLIFLOZIN 10 MG PO TABS
10.0000 mg | ORAL_TABLET | Freq: Every day | ORAL | Status: DC
  Administered 2023-06-13: 10 mg via ORAL
  Filled 2023-06-12: qty 1

## 2023-06-12 MED ORDER — WARFARIN SODIUM 5 MG PO TABS
7.5000 mg | ORAL_TABLET | ORAL | Status: DC
  Administered 2023-06-12: 7.5 mg via ORAL
  Filled 2023-06-12: qty 1

## 2023-06-12 MED ORDER — CHLORHEXIDINE GLUCONATE 0.12 % MT SOLN
15.0000 mL | Freq: Once | OROMUCOSAL | Status: AC
  Administered 2023-06-12: 15 mL via OROMUCOSAL

## 2023-06-12 MED ORDER — SILDENAFIL CITRATE 100 MG PO TABS: 100.0000 mg | ORAL_TABLET | Freq: Every day | ORAL | Status: DC | PRN

## 2023-06-12 MED ORDER — FUROSEMIDE 20 MG PO TABS
20.0000 mg | ORAL_TABLET | Freq: Every day | ORAL | Status: DC
  Administered 2023-06-12 – 2023-06-13 (×2): 20 mg via ORAL
  Filled 2023-06-12 (×2): qty 1

## 2023-06-12 MED ORDER — WARFARIN SODIUM 7.5 MG PO TABS: 7.5000 mg | ORAL_TABLET | Freq: Every day | ORAL | Status: DC

## 2023-06-12 MED ORDER — CEFAZOLIN SODIUM-DEXTROSE 2-4 GM/100ML-% IV SOLN: 2.0000 g | Freq: Three times a day (TID) | INTRAVENOUS | Status: DC

## 2023-06-12 MED ORDER — FENTANYL CITRATE PF 50 MCG/ML IJ SOSY: 25.0000 ug | PREFILLED_SYRINGE | INTRAMUSCULAR | Status: DC | PRN

## 2023-06-12 MED ORDER — INSULIN ASPART 100 UNIT/ML IJ SOLN: 0.0000 [IU] | INTRAMUSCULAR | Status: DC | PRN

## 2023-06-12 MED ORDER — METOPROLOL TARTRATE 50 MG PO TABS
75.0000 mg | ORAL_TABLET | Freq: Two times a day (BID) | ORAL | Status: DC
  Administered 2023-06-12 – 2023-06-13 (×2): 75 mg via ORAL
  Filled 2023-06-12 (×2): qty 2

## 2023-06-12 MED ORDER — POLYETHYLENE GLYCOL 3350 17 G PO PACK: 17.0000 g | PACK | Freq: Every day | ORAL | Status: DC | PRN

## 2023-06-12 MED ORDER — ROCURONIUM BROMIDE 10 MG/ML (PF) SYRINGE
PREFILLED_SYRINGE | INTRAVENOUS | Status: AC
  Filled 2023-06-12: qty 10

## 2023-06-12 MED ORDER — FENTANYL CITRATE PF 50 MCG/ML IJ SOSY
100.0000 ug | PREFILLED_SYRINGE | Freq: Once | INTRAMUSCULAR | Status: AC
  Administered 2023-06-12: 50 ug via INTRAVENOUS
  Filled 2023-06-12: qty 2

## 2023-06-12 MED ORDER — ATORVASTATIN CALCIUM 40 MG PO TABS
40.0000 mg | ORAL_TABLET | Freq: Every day | ORAL | Status: DC
  Administered 2023-06-12: 40 mg via ORAL
  Filled 2023-06-12: qty 1

## 2023-06-12 MED ORDER — PROPOFOL 10 MG/ML IV BOLUS
INTRAVENOUS | Status: AC
  Filled 2023-06-12: qty 20

## 2023-06-12 MED ORDER — OXYCODONE HCL 5 MG PO TABS: 5.0000 mg | ORAL_TABLET | Freq: Once | ORAL | Status: DC | PRN

## 2023-06-12 MED ORDER — INSULIN ASPART 100 UNIT/ML IJ SOLN
0.0000 [IU] | Freq: Every day | INTRAMUSCULAR | Status: DC
  Administered 2023-06-12: 2 [IU] via SUBCUTANEOUS

## 2023-06-12 MED ORDER — EPHEDRINE 5 MG/ML INJ
INTRAVENOUS | Status: AC
  Filled 2023-06-12: qty 5

## 2023-06-12 MED ORDER — OXYCODONE-ACETAMINOPHEN 5-325 MG PO TABS: 1.0000 | ORAL_TABLET | ORAL | 0 refills | Status: DC | PRN

## 2023-06-12 MED ORDER — DEXAMETHASONE SODIUM PHOSPHATE 10 MG/ML IJ SOLN
INTRAMUSCULAR | Status: AC
  Filled 2023-06-12: qty 1

## 2023-06-12 MED ORDER — SPIRONOLACTONE 25 MG PO TABS: 25.0000 mg | ORAL_TABLET | Freq: Every day | ORAL | Status: DC

## 2023-06-12 MED ORDER — SODIUM CHLORIDE 0.9% FLUSH: 3.0000 mL | INTRAVENOUS | Status: DC | PRN

## 2023-06-12 MED ORDER — EPHEDRINE SULFATE (PRESSORS) 50 MG/ML IJ SOLN
INTRAMUSCULAR | Status: DC | PRN
  Administered 2023-06-12 (×2): 5 mg via INTRAVENOUS

## 2023-06-12 MED ORDER — ONDANSETRON HCL 4 MG PO TABS: 4.0000 mg | ORAL_TABLET | Freq: Four times a day (QID) | ORAL | Status: DC | PRN

## 2023-06-12 MED ORDER — FENTANYL CITRATE (PF) 100 MCG/2ML IJ SOLN
INTRAMUSCULAR | Status: AC
  Filled 2023-06-12: qty 2

## 2023-06-12 MED ORDER — MENTHOL 3 MG MT LOZG: 1.0000 | LOZENGE | OROMUCOSAL | Status: DC | PRN

## 2023-06-12 MED ORDER — KRILL OIL 1000 MG PO CAPS: 1000.0000 mg | ORAL_CAPSULE | Freq: Every day | ORAL | Status: DC

## 2023-06-12 MED ORDER — METOCLOPRAMIDE HCL 5 MG PO TABS: 5.0000 mg | ORAL_TABLET | Freq: Three times a day (TID) | ORAL | Status: DC | PRN

## 2023-06-12 MED ORDER — HYDROMORPHONE HCL 1 MG/ML IJ SOLN
0.5000 mg | INTRAMUSCULAR | Status: DC | PRN
  Administered 2023-06-12 (×2): 1 mg via INTRAVENOUS
  Filled 2023-06-12: qty 1

## 2023-06-12 MED ORDER — BUPIVACAINE-EPINEPHRINE (PF) 0.25% -1:200000 IJ SOLN
INTRAMUSCULAR | Status: AC
  Filled 2023-06-12: qty 30

## 2023-06-12 MED ORDER — AMLODIPINE BESYLATE 10 MG PO TABS
10.0000 mg | ORAL_TABLET | Freq: Every day | ORAL | Status: DC
  Administered 2023-06-13: 10 mg via ORAL
  Filled 2023-06-12: qty 1

## 2023-06-12 MED ORDER — PHENOL 1.4 % MT LIQD: 1.0000 | OROMUCOSAL | Status: DC | PRN

## 2023-06-12 MED ORDER — PHENYLEPHRINE 80 MCG/ML (10ML) SYRINGE FOR IV PUSH (FOR BLOOD PRESSURE SUPPORT)
PREFILLED_SYRINGE | INTRAVENOUS | Status: AC
  Filled 2023-06-12: qty 10

## 2023-06-12 MED ORDER — PHENYLEPHRINE HCL-NACL 20-0.9 MG/250ML-% IV SOLN
INTRAVENOUS | Status: AC
  Filled 2023-06-12: qty 250

## 2023-06-12 MED ORDER — ONDANSETRON HCL 4 MG/2ML IJ SOLN
INTRAMUSCULAR | Status: DC | PRN
  Administered 2023-06-12: 4 mg via INTRAVENOUS

## 2023-06-12 MED ORDER — BUPIVACAINE LIPOSOME 1.3 % IJ SUSP
INTRAMUSCULAR | Status: DC | PRN
  Administered 2023-06-12: 10 mL via PERINEURAL

## 2023-06-12 MED ORDER — OXYCODONE HCL 5 MG PO TABS
5.0000 mg | ORAL_TABLET | ORAL | Status: DC | PRN
  Administered 2023-06-12 – 2023-06-13 (×4): 10 mg via ORAL
  Filled 2023-06-12 (×4): qty 2

## 2023-06-12 MED ORDER — LACTATED RINGERS IV SOLN: INTRAVENOUS | Status: DC

## 2023-06-12 MED ORDER — ONDANSETRON HCL 4 MG/2ML IJ SOLN: 4.0000 mg | Freq: Four times a day (QID) | INTRAMUSCULAR | Status: DC | PRN

## 2023-06-12 MED ORDER — CHLORTHALIDONE 25 MG PO TABS
25.0000 mg | ORAL_TABLET | Freq: Every day | ORAL | Status: DC
  Administered 2023-06-13: 25 mg via ORAL
  Filled 2023-06-12: qty 1

## 2023-06-12 MED ORDER — ACETAMINOPHEN 325 MG PO TABS
325.0000 mg | ORAL_TABLET | Freq: Four times a day (QID) | ORAL | Status: DC | PRN
  Administered 2023-06-13: 650 mg via ORAL
  Filled 2023-06-12: qty 2

## 2023-06-12 MED ORDER — COLCHICINE 0.6 MG PO TABS: 0.6000 mg | ORAL_TABLET | Freq: Every day | ORAL | Status: DC | PRN

## 2023-06-12 MED ORDER — DROPERIDOL 2.5 MG/ML IJ SOLN: 0.6250 mg | Freq: Once | INTRAMUSCULAR | Status: DC | PRN

## 2023-06-12 MED ORDER — LIDOCAINE HCL (PF) 2 % IJ SOLN
INTRAMUSCULAR | Status: AC
  Filled 2023-06-12: qty 5

## 2023-06-12 MED ORDER — 0.9 % SODIUM CHLORIDE (POUR BTL) OPTIME
TOPICAL | Status: DC | PRN
Start: 2023-06-12 — End: 2023-06-12
  Administered 2023-06-12: 1000 mL

## 2023-06-12 MED ORDER — ACETAMINOPHEN 500 MG PO TABS
1000.0000 mg | ORAL_TABLET | Freq: Once | ORAL | Status: AC
  Administered 2023-06-12: 1000 mg via ORAL
  Filled 2023-06-12: qty 2

## 2023-06-12 MED ORDER — GLYCOPYRROLATE 0.2 MG/ML IJ SOLN
INTRAMUSCULAR | Status: AC
  Filled 2023-06-12: qty 1

## 2023-06-12 MED ORDER — ADULT MULTIVITAMIN W/MINERALS CH
1.0000 | ORAL_TABLET | Freq: Every day | ORAL | Status: DC
  Administered 2023-06-12 – 2023-06-13 (×2): 1 via ORAL
  Filled 2023-06-12 (×2): qty 1

## 2023-06-12 MED ORDER — DEXAMETHASONE SODIUM PHOSPHATE 10 MG/ML IJ SOLN
INTRAMUSCULAR | Status: DC | PRN
  Administered 2023-06-12: 5 mg via INTRAVENOUS

## 2023-06-12 MED ORDER — GLIPIZIDE ER 5 MG PO TB24: 5.0000 mg | ORAL_TABLET | Freq: Every day | ORAL | Status: DC

## 2023-06-12 MED ORDER — GLYCOPYRROLATE 0.2 MG/ML IJ SOLN
INTRAMUSCULAR | Status: DC | PRN
  Administered 2023-06-12: .2 mg via INTRAVENOUS

## 2023-06-12 MED ORDER — EPINEPHRINE 0.3 MG/0.3ML IJ SOAJ: 0.3000 mg | Freq: Once | INTRAMUSCULAR | Status: DC | PRN

## 2023-06-12 MED ORDER — PROPOFOL 10 MG/ML IV BOLUS
INTRAVENOUS | Status: DC | PRN
  Administered 2023-06-12: 170 mg via INTRAVENOUS
  Administered 2023-06-12: 10 mg via INTRAVENOUS

## 2023-06-12 MED ORDER — ORAL CARE MOUTH RINSE: 15.0000 mL | Freq: Once | OROMUCOSAL | Status: AC

## 2023-06-12 MED ORDER — ROCURONIUM BROMIDE 100 MG/10ML IV SOLN
INTRAVENOUS | Status: DC | PRN
  Administered 2023-06-12: 20 mg via INTRAVENOUS
  Administered 2023-06-12: 60 mg via INTRAVENOUS

## 2023-06-12 MED ORDER — DOCUSATE SODIUM 100 MG PO CAPS
100.0000 mg | ORAL_CAPSULE | Freq: Two times a day (BID) | ORAL | Status: DC
  Administered 2023-06-12 – 2023-06-13 (×2): 100 mg via ORAL
  Filled 2023-06-12 (×2): qty 1

## 2023-06-12 MED ORDER — BUPIVACAINE-EPINEPHRINE (PF) 0.5% -1:200000 IJ SOLN
INTRAMUSCULAR | Status: DC | PRN
Start: 2023-06-12 — End: 2023-06-12
  Administered 2023-06-12: 15 mL via PERINEURAL

## 2023-06-12 MED ORDER — BUPIVACAINE-EPINEPHRINE (PF) 0.25% -1:200000 IJ SOLN
INTRAMUSCULAR | Status: DC | PRN
  Administered 2023-06-12: 20 mL via PERINEURAL

## 2023-06-12 MED ORDER — WARFARIN - PHYSICIAN DOSING INPATIENT: Freq: Every day | Status: DC

## 2023-06-12 MED ORDER — CEFAZOLIN SODIUM-DEXTROSE 2-3 GM-%(50ML) IV SOLR
INTRAVENOUS | Status: DC | PRN
  Administered 2023-06-12: 2 g via INTRAVENOUS

## 2023-06-12 SURGICAL SUPPLY — 58 items
BAG COUNTER SPONGE SURGICOUNT (BAG) IMPLANT
BAG ZIPLOCK 12X15 (MISCELLANEOUS) IMPLANT
BIT DRILL 170X2.5X (BIT) IMPLANT
BLADE SAG 18X100X1.27 (BLADE) ×1 IMPLANT
COVER BACK TABLE 60X90IN (DRAPES) ×1 IMPLANT
COVER SURGICAL LIGHT HANDLE (MISCELLANEOUS) ×1 IMPLANT
DRAPE INCISE IOBAN 66X45 STRL (DRAPES) ×1 IMPLANT
DRAPE SHEET LG 3/4 BI-LAMINATE (DRAPES) ×1 IMPLANT
DRAPE SURG ORHT 6 SPLT 77X108 (DRAPES) ×2 IMPLANT
DRAPE TOP 10253 STERILE (DRAPES) ×1 IMPLANT
DRAPE U-SHAPE 47X51 STRL (DRAPES) ×1 IMPLANT
DRSG ADAPTIC 3X8 NADH LF (GAUZE/BANDAGES/DRESSINGS) ×1 IMPLANT
DRSG AQUACEL AG ADV 3.5X10 (GAUZE/BANDAGES/DRESSINGS) ×1 IMPLANT
DURAPREP 26ML APPLICATOR (WOUND CARE) ×1 IMPLANT
ELECT BLADE TIP CTD 4 INCH (ELECTRODE) ×1 IMPLANT
ELECT NDL TIP 2.8 STRL (NEEDLE) ×1 IMPLANT
ELECT NEEDLE TIP 2.8 STRL (NEEDLE) ×1 IMPLANT
ELECT PENCIL ROCKER SW 15FT (MISCELLANEOUS) ×1 IMPLANT
ELECT REM PT RETURN 15FT ADLT (MISCELLANEOUS) ×1 IMPLANT
EPIPHYSIS CENTER SZ 2 LT (Shoulder) IMPLANT
FACESHIELD WRAPAROUND (MASK) ×1 IMPLANT
FACESHIELD WRAPAROUND OR TEAM (MASK) ×1 IMPLANT
GAUZE PAD ABD 8X10 STRL (GAUZE/BANDAGES/DRESSINGS) ×1 IMPLANT
GAUZE SPONGE 4X4 12PLY STRL (GAUZE/BANDAGES/DRESSINGS) ×1 IMPLANT
GLENOSPHERE XTEND LAT 42+0 STD (Miscellaneous) IMPLANT
GLOVE BIOGEL PI IND STRL 7.5 (GLOVE) ×1 IMPLANT
GLOVE BIOGEL PI IND STRL 8.5 (GLOVE) ×1 IMPLANT
GLOVE ORTHO TXT STRL SZ7.5 (GLOVE) ×1 IMPLANT
GLOVE SURG ORTHO 8.5 STRL (GLOVE) ×1 IMPLANT
GOWN STRL REUS W/ TWL XL LVL3 (GOWN DISPOSABLE) ×2 IMPLANT
KIT BASIN OR (CUSTOM PROCEDURE TRAY) ×1 IMPLANT
KIT TURNOVER KIT A (KITS) ×1 IMPLANT
MANIFOLD NEPTUNE II (INSTRUMENTS) ×1 IMPLANT
METAGLENE DELTA EXTEND (Trauma) IMPLANT
NDL MAYO CATGUT SZ4 TPR NDL (NEEDLE) IMPLANT
NEEDLE MAYO CATGUT SZ4 (NEEDLE) ×1 IMPLANT
NS IRRIG 1000ML POUR BTL (IV SOLUTION) ×1 IMPLANT
PACK SHOULDER (CUSTOM PROCEDURE TRAY) ×1 IMPLANT
PIN GUIDE 1.2 (PIN) IMPLANT
PIN GUIDE GLENOPHERE 1.5MX300M (PIN) IMPLANT
PIN METAGLENE 2.5 (PIN) IMPLANT
RESTRAINT HEAD UNIVERSAL NS (MISCELLANEOUS) ×1 IMPLANT
SCREW 4.5X36MM (Screw) IMPLANT
SCREW 48L (Screw) IMPLANT
SCREW BN 18X4.5XSTRL SHLDR (Screw) IMPLANT
SLING ARM FOAM STRAP LRG (SOFTGOODS) IMPLANT
SPACER 42 PLUS 6 (Orthopedic Implant) IMPLANT
SPIKE FLUID TRANSFER (MISCELLANEOUS) ×1 IMPLANT
STEM 12 HA (Stem) IMPLANT
STRIP CLOSURE SKIN 1/2X4 (GAUZE/BANDAGES/DRESSINGS) ×1 IMPLANT
SUT MNCRL AB 4-0 PS2 18 (SUTURE) ×1 IMPLANT
SUT VIC AB 0 CT1 36 (SUTURE) ×1 IMPLANT
SUT VIC AB 0 CT2 27 (SUTURE) ×1 IMPLANT
SUT VIC AB 2-0 CT1 TAPERPNT 27 (SUTURE) ×1 IMPLANT
SUTURE FIBERWR #2 38 T-5 BLUE (SUTURE) ×2 IMPLANT
TAPE CLOTH SURG 4X10 WHT LF (GAUZE/BANDAGES/DRESSINGS) IMPLANT
TOWEL GREEN STERILE FF (TOWEL DISPOSABLE) ×1 IMPLANT
TOWEL OR 17X26 10 PK STRL BLUE (TOWEL DISPOSABLE) ×1 IMPLANT

## 2023-06-12 NOTE — Anesthesia Procedure Notes (Signed)
 Procedure Name: Intubation Date/Time: 06/12/2023 1:34 PM  Performed by: Patt Boozer, CRNAPre-anesthesia Checklist: Patient identified, Emergency Drugs available, Suction available and Patient being monitored Patient Re-evaluated:Patient Re-evaluated prior to induction Oxygen Delivery Method: Circle system utilized Preoxygenation: Pre-oxygenation with 100% oxygen Induction Type: IV induction Ventilation: Mask ventilation without difficulty Laryngoscope Size: Mac and 4 Grade View: Grade I Tube type: Oral Tube size: 7.5 mm Number of attempts: 1 Airway Equipment and Method: Stylet Placement Confirmation: ETT inserted through vocal cords under direct vision, positive ETCO2 and breath sounds checked- equal and bilateral Secured at: 24 cm Tube secured with: Tape Dental Injury: Teeth and Oropharynx as per pre-operative assessment

## 2023-06-12 NOTE — Discharge Instructions (Signed)
 Ice to the shoulder constantly.  Keep the incision covered and clean and dry for one week, then ok to get it wet in the shower. May remove the Aquacel bandage next Wednesday and then leave the incision uncovered.   Do exercise as instructed several times per day.  DO NOT reach behind your back or push up out of a chair with the operative arm.  Use a sling while you are up and around for comfort, may remove while seated.  Keep pillow propped behind the operative elbow.  Follow up with Dr Brunilda Capra in two weeks in the office, call [phone] for appt  Please call Dr Brunilda Capra (cell) [phone] with any questions or concerns

## 2023-06-12 NOTE — Brief Op Note (Signed)
 06/12/2023  3:11 PM  PATIENT:  Miguel Cox  73 y.o. male  PRE-OPERATIVE DIAGNOSIS:  Left shoulder rotator cuff arthropathy  POST-OPERATIVE DIAGNOSIS:  Left shoulder rotator cuff arthropathy  PROCEDURE:  Procedure(s): ARTHROPLASTY, SHOULDER, TOTAL, REVERSE (Left) DePuy Delta with NO subscap repair  SURGEON:  Surgeons and Role:    Winston Hawking, MD - Primary  PHYSICIAN ASSISTANT:   ASSISTANTS: Corinthia Dickinson, PA-C   ANESTHESIA:   regional and general  EBL:  100 mL   BLOOD ADMINISTERED:none  DRAINS: none   LOCAL MEDICATIONS USED:  MARCAINE      SPECIMEN:  No Specimen  DISPOSITION OF SPECIMEN:  N/A  COUNTS:  YES  TOURNIQUET:  * No tourniquets in log *  DICTATION: .Other dictation number [id number]  PLAN OF CARE: Admit for overnight observation  PATIENT DISPOSITION:  PACU - hemodynamically stable.   Delay start of Pharmacological VTE agent (>24hrs) due to surgical blood loss or risk of bleeding: not applicable

## 2023-06-12 NOTE — Transfer of Care (Signed)
 Immediate Anesthesia Transfer of Care Note  Patient: Miguel Cox  Procedure(s) Performed: ARTHROPLASTY, SHOULDER, TOTAL, REVERSE (Left: Shoulder)  Patient Location: PACU  Anesthesia Type:General  Level of Consciousness: awake, alert , oriented, and patient cooperative  Airway & Oxygen Therapy: Patient Spontanous Breathing and Patient connected to face mask oxygen  Post-op Assessment: Report given to RN and Post -op Vital signs reviewed and stable  Post vital signs: Reviewed and stable  Last Vitals:  Vitals Value Taken Time  BP 153/69 06/12/23 1534  Temp    Pulse 56 06/12/23 1536  Resp 20 06/12/23 1536  SpO2 97 % 06/12/23 1536  Vitals shown include unfiled device data.  Last Pain:  Vitals:   06/12/23 0943  TempSrc: Oral  PainSc: 0-No pain         Complications: No notable events documented.

## 2023-06-12 NOTE — Plan of Care (Signed)
  Problem: Education: Goal: Knowledge of General Education information will improve Description: Including pain rating scale, medication(s)/side effects and non-pharmacologic comfort measures Outcome: Progressing   Problem: Health Behavior/Discharge Planning: Goal: Ability to manage health-related needs will improve Outcome: Progressing   Problem: Clinical Measurements: Goal: Ability to maintain clinical measurements within normal limits will improve Outcome: Progressing Goal: Will remain free from infection Outcome: Progressing Goal: Diagnostic test results will improve Outcome: Progressing Goal: Respiratory complications will improve Outcome: Progressing Goal: Cardiovascular complication will be avoided Outcome: Progressing   Problem: Activity: Goal: Risk for activity intolerance will decrease Outcome: Progressing   Problem: Nutrition: Goal: Adequate nutrition will be maintained Outcome: Progressing   Problem: Coping: Goal: Level of anxiety will decrease Outcome: Progressing   Problem: Elimination: Goal: Will not experience complications related to bowel motility Outcome: Progressing Goal: Will not experience complications related to urinary retention Outcome: Progressing   Problem: Pain Managment: Goal: General experience of comfort will improve and/or be controlled Outcome: Progressing   Problem: Safety: Goal: Ability to remain free from injury will improve Outcome: Progressing   Problem: Skin Integrity: Goal: Risk for impaired skin integrity will decrease Outcome: Progressing   Problem: Education: Goal: Ability to describe self-care measures that may prevent or decrease complications (Diabetes Survival Skills Education) will improve Outcome: Progressing Goal: Individualized Educational Video(s) Outcome: Progressing   Problem: Coping: Goal: Ability to adjust to condition or change in health will improve Outcome: Progressing   Problem: Fluid  Volume: Goal: Ability to maintain a balanced intake and output will improve Outcome: Progressing   Problem: Health Behavior/Discharge Planning: Goal: Ability to identify and utilize available resources and services will improve Outcome: Progressing Goal: Ability to manage health-related needs will improve Outcome: Progressing   Problem: Metabolic: Goal: Ability to maintain appropriate glucose levels will improve Outcome: Progressing   Problem: Nutritional: Goal: Maintenance of adequate nutrition will improve Outcome: Progressing Goal: Progress toward achieving an optimal weight will improve Outcome: Progressing   Problem: Skin Integrity: Goal: Risk for impaired skin integrity will decrease Outcome: Progressing   Problem: Tissue Perfusion: Goal: Adequacy of tissue perfusion will improve Outcome: Progressing   Problem: Education: Goal: Knowledge of the prescribed therapeutic regimen will improve Outcome: Progressing Goal: Understanding of activity limitations/precautions following surgery will improve Outcome: Progressing Goal: Individualized Educational Video(s) Outcome: Progressing   Problem: Activity: Goal: Ability to tolerate increased activity will improve Outcome: Progressing   Problem: Pain Management: Goal: Pain level will decrease with appropriate interventions Outcome: Progressing

## 2023-06-12 NOTE — Plan of Care (Signed)
  Problem: Activity: Goal: Risk for activity intolerance will decrease Outcome: Progressing   Problem: Nutrition: Goal: Adequate nutrition will be maintained Outcome: Progressing   Problem: Safety: Goal: Ability to remain free from injury will improve Outcome: Progressing   Problem: Pain Managment: Goal: General experience of comfort will improve and/or be controlled Outcome: Progressing

## 2023-06-12 NOTE — Anesthesia Postprocedure Evaluation (Signed)
 Anesthesia Post Note  Patient: Miguel Cox  Procedure(s) Performed: ARTHROPLASTY, SHOULDER, TOTAL, REVERSE (Left: Shoulder)     Patient location during evaluation: PACU Anesthesia Type: General Level of consciousness: awake and alert Pain management: pain level controlled Vital Signs Assessment: post-procedure vital signs reviewed and stable Respiratory status: spontaneous breathing, nonlabored ventilation and respiratory function stable Cardiovascular status: blood pressure returned to baseline Postop Assessment: no apparent nausea or vomiting Anesthetic complications: no   No notable events documented.  Last Vitals:  Vitals:   06/12/23 1545 06/12/23 1600  BP: 132/69 133/69  Pulse: (!) 53 (!) 46  Resp: 16 20  Temp:    SpO2: 93% 94%    Last Pain:  Vitals:   06/12/23 1600  TempSrc:   PainSc: 0-No pain                 Rayfield Cairo

## 2023-06-12 NOTE — Anesthesia Procedure Notes (Addendum)
 Anesthesia Regional Block: Interscalene brachial plexus block   Pre-Anesthetic Checklist: , timeout performed,  Correct Patient, Correct Site, Correct Laterality,  Correct Procedure, Correct Position, site marked,  Risks and benefits discussed,  Pre-op evaluation,  At surgeon's request and post-op pain management  Laterality: Left  Prep: Maximum Sterile Barrier Precautions used, chloraprep       Needles:  Injection technique: Single-shot  Needle Type: Echogenic Stimulator Needle     Needle Length: 4cm  Needle Gauge: 22     Additional Needles:   Procedures:,,,, ultrasound used (permanent image in chart),,    Narrative:  Start time: 06/12/2023 12:49 PM End time: 06/12/2023 12:52 PM Injection made incrementally with aspirations every 5 mL.  Performed by: Personally  Anesthesiologist: Vernadine Golas, MD  Additional Notes: Risks, benefits, and alternative discussed. Patient gave consent for procedure. Patient prepped and draped in sterile fashion. Sedation administered, patient remains easily responsive to voice. Relevant anatomy identified with ultrasound guidance. Local anesthetic given in 5cc increments with no signs or symptoms of intravascular injection. No pain or paraesthesias with injection. Patient monitored throughout procedure with signs of LAST or immediate complications. Tolerated well. Ultrasound image placed in chart.  Amador Junes, MD

## 2023-06-12 NOTE — Interval H&P Note (Signed)
 History and Physical Interval Note:  06/12/2023 1:02 PM  Miguel Cox  has presented today for surgery, with the diagnosis of Left shoulder rotator cuff arthropathy.  The various methods of treatment have been discussed with the patient and family. After consideration of risks, benefits and other options for treatment, the patient has consented to  Procedure(s): ARTHROPLASTY, SHOULDER, TOTAL, REVERSE (Left) as a surgical intervention.  The patient's history has been reviewed, patient examined, no change in status, stable for surgery.  I have reviewed the patient's chart and labs.  Questions were answered to the patient's satisfaction.     Lorriane Rote

## 2023-06-13 ENCOUNTER — Encounter (HOSPITAL_COMMUNITY): Payer: Self-pay | Admitting: Orthopedic Surgery

## 2023-06-13 DIAGNOSIS — M19012 Primary osteoarthritis, left shoulder: Secondary | ICD-10-CM | POA: Diagnosis not present

## 2023-06-13 LAB — PROTIME-INR
INR: 1.2 (ref 0.8–1.2)
Prothrombin Time: 15.4 s — ABNORMAL HIGH (ref 11.4–15.2)

## 2023-06-13 LAB — GLUCOSE, CAPILLARY: Glucose-Capillary: 137 mg/dL — ABNORMAL HIGH (ref 70–99)

## 2023-06-13 NOTE — Progress Notes (Signed)
   06/13/23 0831  TOC Brief Assessment  Insurance and Status Reviewed  Patient has primary care physician Yes  Home environment has been reviewed resides in private residence with spouse  Prior level of function: Independent  Prior/Current Home Services No current home services  Social Drivers of Health Review SDOH reviewed no interventions necessary  Readmission risk has been reviewed Yes  Transition of care needs no transition of care needs at this time

## 2023-06-13 NOTE — Care Management Obs Status (Signed)
 MEDICARE OBSERVATION STATUS NOTIFICATION   Patient Details  Name: DACARRI SURFACE MRN: [id number] Date of Birth: 31-May-1950   Medicare Observation Status Notification Given:  Yes    Bari Leys, RN 06/13/2023, 8:29 AM

## 2023-06-13 NOTE — Op Note (Signed)
 Miguel, Cox MEDICAL RECORD NO: [id number] ACCOUNT NO: [id number] DATE OF BIRTH: 07-Feb-1950 FACILITY: [facility]: [id number] PHYSICIAN: Miguel Cox. Brunilda Capra, MD  Operative Report   DATE OF PROCEDURE: 06/12/2023   PREOPERATIVE DIAGNOSIS: 1. Left shoulder end-stage arthritis. 2. Rotator cuff insufficiency.  POSTOPERATIVE DIAGNOSES: 1. Left shoulder end-stage arthritis. 2. Rotator cuff insufficiency.  PROCEDURE PERFORMED: Left reverse total shoulder arthroplasty using DePuy Delta Xtend prosthesis with no subscapularis repair.  ATTENDING SURGEON: Miguel Cox. Brunilda Capra, MD  ASSISTANT: Barton Like Dixon, [place], who was scrubbed during the entire procedure, and necessary for satisfactory completion of surgery.  ANESTHESIA: General anesthesia was used plus interscalene block.  ESTIMATED BLOOD LOSS: 100 mL.  FLUID REPLACEMENT: 1500 mL crystalloid.  INSTRUMENT COUNTS: Correct.  COMPLICATIONS: None.  ANTIBIOTICS: Perioperative antibiotics were given.  INDICATIONS: The patient is a 73 year old male who presents with a history of worsening left shoulder pain secondary to end-stage rotator cuff tear arthropathy. The patient has failed an extended period of conservative management and desires operative  treatment to eliminate pain and restore function. Informed consent obtained.  DESCRIPTION OF PROCEDURE: After an adequate level of general anesthesia was achieved plus interscalene block, the patient was positioned in the modified beach chair position. Left shoulder was correctly identified and sterile prep and drape were  performed. Timeout called verifying correct patient and correct site. We entered the patient's shoulder using a standard deltopectoral incision starting at the coracoid process and extending down to the anterior humerus. Dissection down through  subcutaneous tissues using Bovie. The cephalic vein was identified and taken laterally to the deltoid. Pectoralis was taken  medially. The conjoined tendon was identified and retracted medially. Deep retractor was placed. We tenodesed the biceps in situ  with #0 Vicryl figure-of-eight suture x2 incorporating part of the pec tendon. We then released the subscapularis and subperiosteally off the lesser tuberosity. This was contracted and stiff and felt to be probably not repairable, but we tagged it just  in case with #2 FiberWire suture in a modified Mason-Allen suture technique. We released the inferior capsule extending the shoulder with progressive external rotation delivering the humeral head out of the wound. There was advanced chondromalacia and  cartilage loss on the humeral head indicating the arthritis that the patient was experiencing. We entered the proximal humerus with a 6-mm reamer and reamed up to a size 12. We then placed our 12-mm T-handle guide and resected the head at 20 degrees of  retroversion with the oscillating saw. We removed excess osteophytes with a rongeur. We then subluxed the humerus posteriorly, gaining good exposure of the glenoid face. We removed the biceps stump, the hypertrophied labrum and capsule, and we were able  to place our deep retractors with good glenoid visualization. We removed the remaining cartilage from the glenoid with a Cobb elevator and rongeur getting down to bone. We then placed our guide pin centered low on the glenoid bicortically. We reamed to  subchondral bone for the metaglene baseplate and did our peripheral T-handle reamer by hand making room for the glenosphere. Next, we drilled out our central peg hole. We then impacted the HA-coated press-fit baseplate into position. We placed a 42 screw  inferiorly with a 36 screw at the base of the coracoid superiorly and locked both the screws. We then placed a nonlocked 18 screw posteriorly. We had excellent baseplate stability and support. Screw purchase was excellent. We then placed a 42 +0  standard glenosphere on the baseplate  and secured that with  a screwdriver. I did a finger sweep to make sure we had no soft tissue caught up between the baseplate and the glenosphere. We then went back to the humeral side and reamed for the two left  metaphysis. We then trialled with the 12 stem and the two left metaphysis set in the 0 setting and placed in 20 degrees of retroversion. We selected a 42 +3 poly trial, placed on the humeral tray and reduced the shoulder. We had excellent stability and  soft tissue balancing. We removed all the trial components from the humeral side. We irrigated thoroughly. I then used available bone graft from the humeral head and used impaction grafting technique with the HA-coated press-fit 12 stem and the two left  metaphysis set in the 0 setting and placed in 20 degrees of retroversion. With the stem securely in place, I selected the real 42 +6 poly, placed that on the humeral tray, impacted that and reduced the shoulder. We were happy with our soft tissue  balancing, conjugated appropriately tensioned. No gapping with inferior pole or external rotation. We then irrigated thoroughly. I felt like the subscapularis was going to restrict motion and was too stiff. We removed the tip of it, irrigated again and  then closed the deltopectoral interval with #0 Vicryl suture followed by 2-0 Vicryl for subcutaneous closure and running 4-0 Monocryl for skin. Steri-Strips were applied followed by a sterile dressing. The patient tolerated the surgery well.    SUJ D: 06/12/2023 3:22:33 pm T: 06/13/2023 2:48:00 am  JOB: [id number]/ [id number]

## 2023-06-13 NOTE — Discharge Summary (Signed)
 In most cases prophylactic antibiotics for Dental procdeures after total joint surgery are not necessary.  Exceptions are as follows:  1. History of prior total joint infection  2. Severely immunocompromised (Organ Transplant, cancer chemotherapy, Rheumatoid biologic meds such as Humera)  3. Poorly controlled diabetes (A1C &gt; 8.0, blood glucose over 200)  If you have one of these conditions, contact your surgeon for an antibiotic prescription, prior to your dental procedure. Orthopedic Discharge Summary        Physician Discharge Summary  Patient ID: Miguel Cox MRN: [id number] DOB/AGE: 27-Jun-1950 73 y.o.  Admit date: 06/12/2023 Discharge date: 06/13/2023   Procedures:  Procedure(s) (LRB): ARTHROPLASTY, SHOULDER, TOTAL, REVERSE (Left)  Attending Physician:  Dr. Marionette Sick  Admission Diagnoses:   right shoulder cuff arthropathy  Discharge Diagnoses:  right shoulder cuff arthropathy   Past Medical History:  Diagnosis Date   Atrial fibrillation (HCC)    Diagnosed 2020   Diverticula of intestine    DJD (degenerative joint disease)    Dysrhythmia    Essential hypertension    Gout    History of kidney stones    OSA (obstructive sleep apnea)    Pt denies, Stop Bang 5   Osteoarthritis    Polio    Type 2 diabetes mellitus (HCC)     [facility]P: [facility]   Discharged Condition: good  Hospital Course:  Patient underwent the above stated procedure on 06/12/2023. Patient tolerated the procedure well and brought to the recovery room in good condition and subsequently to the floor. Patient had an uncomplicated hospital course and was stable for discharge.   Disposition: Discharge disposition: 01-Home or Self Care      with follow up in 2 weeks    Follow-up Information     Winston Hawking, MD. Call in 2 week(s).   Specialty: Orthopedic Surgery Why: please call the office at [phone] for appt in two weeks Contact information: [address] [place] [address] [phone]                 Dental Antibiotics:  In most cases prophylactic antibiotics for Dental procdeures after total joint surgery are not necessary.  Exceptions are as follows:  1. History of prior total joint infection  2. Severely immunocompromised (Organ Transplant, cancer chemotherapy, Rheumatoid biologic meds such as Humera)  3. Poorly controlled diabetes (A1C &gt; 8.0, blood glucose over 200)  If you have one of these conditions, contact your surgeon for an antibiotic prescription, prior to your dental procedure.  Discharge Instructions     Call MD / Call 911   Complete by: As directed    If you experience chest pain or shortness of breath, CALL 911 and be transported to the hospital emergency room.  If you develope a fever above 101 F, pus (white drainage) or increased drainage or redness at the wound, or calf pain, call your surgeon's office.   Constipation Prevention   Complete by: As directed    Drink plenty of fluids.  Prune juice may be helpful.  You may use a stool softener, such as Colace (over the counter) 100 mg twice a day.  Use MiraLax  (over the counter) for constipation as needed.   Diet - low sodium heart healthy   Complete by: As directed    Increase activity slowly as tolerated   Complete by: As directed    Post-operative opioid taper instructions:   Complete by: As directed    POST-OPERATIVE OPIOID  TAPER INSTRUCTIONS: It is important to wean off of your opioid medication as soon as possible. If you do not need pain medication after your surgery it is ok to stop day one. Opioids include: Codeine, Hydrocodone (Norco, Vicodin), Oxycodone (Percocet, oxycontin ) and hydromorphone amongst others.  Long term and even short term use of opiods can cause: Increased pain response Dependence Constipation Depression Respiratory depression And more.  Withdrawal symptoms can include Flu like symptoms Nausea,  vomiting And more Techniques to manage these symptoms Hydrate well Eat regular healthy meals Stay active Use relaxation techniques(deep breathing, meditating, yoga) Do Not substitute Alcohol to help with tapering If you have been on opioids for less than two weeks and do not have pain than it is ok to stop all together.  Plan to wean off of opioids This plan should start within one week post op of your joint replacement. Maintain the same interval or time between taking each dose and first decrease the dose.  Cut the total daily intake of opioids by one tablet each day Next start to increase the time between doses. The last dose that should be eliminated is the evening dose.          Allergies as of 06/13/2023       Reactions   Enalapril Cough   Penicillins Hives, Itching   Did it involve swelling of the face/tongue/throat, SOB, or low BP? No Did it involve sudden or severe rash/hives, skin peeling, or any reaction on the inside of your mouth or nose? No Did you need to seek medical attention at a hospital or doctor's office? No When did it last happen?       If all above answers are "NO", may proceed with cephalosporin use. Tolerated Cephalosporin Date: 07/27/21.   Cefazolin  Rash   Other Swelling, Rash   Pt can't eat anything from [facility], it causes swelling        Medication List     PAUSE taking these medications    glipiZIDE  5 MG 24 hr tablet Wait to take this until your doctor or other care provider tells you to start again. Commonly known as: GLUCOTROL  XL Take 5 mg by mouth daily.       TAKE these medications    amLODipine  10 MG tablet Commonly known as: NORVASC  Take 10 mg by mouth daily.   atorvastatin  40 MG tablet Commonly known as: LIPITOR Take 40 mg by mouth at bedtime.   chlorthalidone 25 MG tablet Commonly known as: HYGROTON Take 25 mg by mouth daily.   colchicine  0.6 MG tablet Take 0.6 mg by mouth daily as needed (gout). Take 2 tablets  and one hour later take a third tablet as needed for gout   cyclobenzaprine 10 MG tablet Commonly known as: FLEXERIL Take 10 mg by mouth 3 (three) times daily as needed for muscle spasms.   empagliflozin  10 MG Tabs tablet Commonly known as: JARDIANCE  Take 1 tablet (10 mg total) by mouth daily before breakfast.   EPINEPHrine  0.3 mg/0.3 mL Soaj injection Commonly known as: EpiPen  2-Pak Inject 0.3 mLs (0.3 mg total) into the muscle once.   furosemide  20 MG tablet Commonly known as: Lasix  Take 1 tablet (20 mg total) by mouth daily.   Krill Oil 1000 MG Caps Take 1,000 mg by mouth daily.   losartan  100 MG tablet Commonly known as: COZAAR  Take 1 tablet (100 mg total) by mouth daily.   Metoprolol  Tartrate 75 MG Tabs Take 75 mg by mouth 2 (two) times  daily.   multivitamin with minerals Tabs tablet Take 1 tablet by mouth daily.   ondansetron  4 MG tablet Commonly known as: Zofran  Take 1 tablet (4 mg total) by mouth every 8 (eight) hours as needed for nausea, vomiting or refractory nausea / vomiting.   oxyCODONE -acetaminophen  5-325 MG tablet Commonly known as: Percocet Take 1 tablet by mouth every 4 (four) hours as needed for severe pain (pain score 7-10).   sildenafil 100 MG tablet Commonly known as: VIAGRA Take 1 tablet by mouth daily as needed.   spironolactone  25 MG tablet Commonly known as: ALDACTONE  Take 1 tablet (25 mg total) by mouth daily.   warfarin 3 MG tablet Commonly known as: COUMADIN  Take as directed. If you are unsure how to take this medication, talk to your nurse or doctor. Original instructions: TAKE 2 AND 1/2 TABLETS DAILY EXCEPT 3 TABLETS ON MONDAYS OR AS DIRECTED BY COUMADIN  CLINIC What changed:  how much to take how to take this when to take this additional instructions          Signed: Corinthia Dickinson 06/13/2023, 7:49 AM  [facility] is now [name] [address], [place], [place] [address] Phone:  [phone] Facebook  Instagram  [employer]

## 2023-06-13 NOTE — Evaluation (Signed)
 Occupational Therapy Evaluation Patient Details Name: Miguel Cox MRN: [id number] DOB: Jan 23, 1951 Today's Date: 06/13/2023   History of Present Illness   Lenville Rinke is a 73 yo male s/p L Reverse TSA. Hx of L RTC tear. PMH: Afib, DJD, HTN, Gout, OSA, polio, DM 2     Clinical Impressions PTA pt lives at home with wife and was independent prior to yesterday's surgery and hospital admission. Education completed regarding compensatory strategies for ADL tasks, management of sling, L ROM  per specified parameters in the order set, positioning of operated arm in sitting and supine and edema control, including use of regular ice packs. Family/caregiver present for education, written handouts provided/reviewed and  and pt/caregiver verbalize/demonstrated understanding using Teach Back. Pt to follow up with MD to progress rehab of the operative shoulder. Pt required min A with caregiver/wife indep to don/doff shoulder immobilizer with education provided to maintain sling all of the time with the exception of bathing, dressing and when completing exercises until the surgeon states otherwise. L AAROM exercises completed with mod I 10 reps each and HEP provided, see details below. Pt was educated on how to complete UB ADLs while maintaining shoulder ROM restrictions with compensatory techniques, pt needs min A to don shirt and complete UB bathing. Overall pt also needed supervision for LB ADLs and mod I for functional mobility without an AD. Shoulder protocol educational hand out provided to encourage carry-over at discharge. All education completed and patient to follow up with MD as per protocol. OT discharge completed.         If plan is discharge home, recommend the following:   A little help with bathing/dressing/bathroom;Assistance with cooking/housework;Assist for transportation;Help with stairs or ramp for entrance     Functional Status Assessment   Patient has had a recent decline in  their functional status and demonstrates the ability to make significant improvements in function in a reasonable and predictable amount of time.     Equipment Recommendations   None recommended by OT      Precautions/Restrictions   Precautions Precautions: Shoulder No shoulder ROM allowed; elbow/wrist/hand AROM only.  Required Braces or Orthoses: Sling Restrictions Weight Bearing Restrictions Per Provider Order: Yes LUE Weight Bearing Per Provider Order: Non weight bearing     Mobility Bed Mobility Overal bed mobility: Modified Independent                  Transfers Overall transfer level: Modified independent                        Balance Overall balance assessment: No apparent balance deficits (not formally assessed)                                         ADL either performed or assessed with clinical judgement   ADL Overall ADL's : Needs assistance/impaired    Per orders, L shoulder parameters as follows for ADL tasks: No shoulder ROM; elbow/wrist/hand ROM only. While moving within specified parameters, pt/caregiver instructed on bathing and how to donn/doff shirt, placing operative arm through sleeve first when donning and off last when doffing.Pt/caregiver educated on compensatory strategies for LB ADL and strategies to reduce risk of falls.  Pt/caregiver educated on donning/doffing sling and to wear the sling at all times with the exception of ADL, and to loosen the neck strap of the sling  when the operative arm is in a supported position when sitting. In sitting or supine, pt instructed to have a pillow behind and under their operative arm to provide support. If assist needed with ambulation, caregiver educated on the importance of walking on pt's non-operative side.  Education regarding use of regular ice packs completed, including the importance of using a barrier on the shoulder prior to positioning the pack. Pt/caregiver  verbalized/demonstrated understanding. Teach Back used while caregiver assisted with dressing pt and positioning ice packs to facilitate DC.                                          Vision Baseline Vision/History: 0 No visual deficits;1 Wears glasses Vision Assessment?: No apparent visual deficits;Wears glasses for reading;Wears glasses for driving     Perception Perception: Within Functional Limits       Praxis Praxis: WFL       Pertinent Vitals/Pain Pain Assessment Pain Assessment: 0-10 Pain Score: 8  Pain Location: B shoulders and L hand Pain Descriptors / Indicators: Aching Pain Intervention(s): Limited activity within patient's tolerance, Premedicated before session, Repositioned, Relaxation, Ice applied     Extremity/Trunk Assessment Upper Extremity Assessment Upper Extremity Assessment: Right hand dominant   Lower Extremity Assessment Lower Extremity Assessment: Overall WFL for tasks assessed   Cervical / Trunk Assessment Cervical / Trunk Assessment: Normal   Communication Communication Communication: No apparent difficulties   Cognition Arousal: Alert Behavior During Therapy: WFL for tasks assessed/performed Cognition: No apparent impairments                               Following commands: Intact       Cueing  General Comments   Cueing Techniques: Verbal cues  L post op dressing in place and intact   Exercises Exercises: Shoulder Shoulder Exercises Elbow Flexion: AAROM, 10 reps Elbow Extension: AAROM, 10 reps Wrist Flexion: AAROM, 10 reps Wrist Extension: AAROM, 10 reps Digit Composite Flexion: AROM, 10 reps Composite Extension: AROM, 10 reps   Shoulder Instructions Shoulder Instructions Donning/doffing shirt without moving shoulder: Modified independent Method for sponge bathing under operated UE: Caregiver independent with task;Patient able to independently direct caregiver Donning/doffing sling/immobilizer:  Caregiver independent with task;Patient able to independently direct caregiver Correct positioning of sling/immobilizer: Caregiver independent with task;Patient able to independently direct caregiver ROM for elbow, wrist and digits of operated UE: Modified independent Sling wearing schedule (on at all times/off for ADL's): Caregiver independent with task;Independent Proper positioning of operated UE when showering: Caregiver independent with task;Patient able to independently direct caregiver Positioning of UE while sleeping: Modified independent;Caregiver independent with task    Home Living Family/patient expects to be discharged to:: Private residence Living Arrangements: Spouse/significant other Available Help at Discharge: Family;Available 24 hours/day Type of Home: House Home Access: Ramped entrance     Home Layout: One level     Bathroom Shower/Tub: Walk-in shower;Door   [employer] Toilet: Handicapped height Bathroom Accessibility: Yes How Accessible: Accessible via walker Home Equipment: Agricultural consultant (2 wheels);Cane - single point;Shower seat;Toilet riser;BSC/3in1;Adaptive equipment Adaptive Equipment: Reacher        Prior Functioning/Environment Prior Level of Function : Independent/Modified Independent                    OT Problem List: Pain;Impaired UE functional use;Decreased strength;Decreased range of motion;Decreased coordination  OT Treatment/Interventions:       AM-PAC OT "6 Clicks" Daily Activity     Outcome Measure Help from another person eating meals?: None Help from another person taking care of personal grooming?: None Help from another person toileting, which includes using toliet, bedpan, or urinal?: None Help from another person bathing (including washing, rinsing, drying)?: A Little Help from another person to put on and taking off regular upper body clothing?: A Little Help from another person to put on and taking off regular lower body  clothing?: A Little 6 Click Score: 21   End of Session Equipment Utilized During Treatment: Gait belt Nurse Communication: Mobility status;Weight bearing status  Activity Tolerance: Patient tolerated treatment well Patient left: in chair;with call bell/phone within reach;with family/visitor present  OT Visit Diagnosis: Pain;Other (comment) (L UE dysfunction) Pain - Right/Left: Left Pain - part of body: Shoulder                Time: 9147-8295 OT Time Calculation (min): 27 min Charges:  OT General Charges $OT Visit: 1 Visit OT Evaluation $OT Eval Low Complexity: 1 Low OT Treatments $Self Care/Home Management : 8-22 mins  Reza Crymes OT/L Acute Rehabilitation Department  [phone]  06/13/2023, 9:32 AM

## 2023-06-13 NOTE — Progress Notes (Signed)
   Subjective: 1 Day Post-Op Procedure(s) (LRB): ARTHROPLASTY, SHOULDER, TOTAL, REVERSE (Left)  Pt doing well Minimal pain this morning in the left shoulder Denies any new symptoms overnight Patient reports pain as mild.  Objective:   VITALS:   Vitals:   06/13/23 0133 06/13/23 0631  BP: (!) 142/82 137/83  Pulse: (!) 58 (!) 51  Resp: 18 18  Temp: 97.7 F (36.5 C) 98 F (36.7 C)  SpO2: 96% 96%    Left shoulder: incision healing well Dressing changed to aquacel Sling in good position Nv intact distally  LABS No results for input(s): "HGB", "HCT", "WBC", "PLT" in the last 72 hours.  No results for input(s): "NA", "K", "BUN", "CREATININE", "GLUCOSE" in the last 72 hours.   Assessment/Plan: 1 Day Post-Op Procedure(s) (LRB): ARTHROPLASTY, SHOULDER, TOTAL, REVERSE (Left) Overall pt doing well Recommend d/c home this morning with family F/u in the office in 2 weeks Pain management Pulmonary toilet   Brad Anner Kill, MPAS [facility] is now [facility] [address], [place], [place] [address] Phone: [phone] [email or web address] Facebook  [employer]

## 2023-06-20 ENCOUNTER — Other Ambulatory Visit: Payer: Self-pay | Admitting: Internal Medicine

## 2023-06-20 DIAGNOSIS — I4819 Other persistent atrial fibrillation: Secondary | ICD-10-CM

## 2023-06-25 ENCOUNTER — Encounter

## 2023-06-25 ENCOUNTER — Ambulatory Visit

## 2023-06-26 ENCOUNTER — Ambulatory Visit: Attending: Internal Medicine | Admitting: *Deleted

## 2023-06-26 DIAGNOSIS — Z5181 Encounter for therapeutic drug level monitoring: Secondary | ICD-10-CM | POA: Insufficient documentation

## 2023-06-26 DIAGNOSIS — I4819 Other persistent atrial fibrillation: Secondary | ICD-10-CM | POA: Diagnosis present

## 2023-06-26 LAB — POCT INR: INR: 2.4 (ref 2.0–3.0)

## 2023-06-26 NOTE — Patient Instructions (Signed)
 Continue warfarin 2 1/2 tablets daily except 2 tablets on Mondays. Be consistent with Vit K foods Recheck in 3 wks

## 2023-07-02 ENCOUNTER — Ambulatory Visit: Admitting: Nurse Practitioner

## 2023-07-17 ENCOUNTER — Ambulatory Visit: Attending: Internal Medicine

## 2023-07-17 DIAGNOSIS — Z5181 Encounter for therapeutic drug level monitoring: Secondary | ICD-10-CM | POA: Insufficient documentation

## 2023-07-17 DIAGNOSIS — I4819 Other persistent atrial fibrillation: Secondary | ICD-10-CM | POA: Insufficient documentation

## 2023-07-17 LAB — POCT INR: INR: 2.3 (ref 2.0–3.0)

## 2023-07-17 NOTE — Patient Instructions (Signed)
 Description   Continue warfarin 2 1/2 tablets daily except 2 tablets on Mondays. Be consistent with Vit K foods Recheck in 4 wks

## 2023-08-14 ENCOUNTER — Ambulatory Visit: Attending: Internal Medicine | Admitting: *Deleted

## 2023-08-14 DIAGNOSIS — I4819 Other persistent atrial fibrillation: Secondary | ICD-10-CM | POA: Diagnosis present

## 2023-08-14 DIAGNOSIS — Z5181 Encounter for therapeutic drug level monitoring: Secondary | ICD-10-CM | POA: Diagnosis present

## 2023-08-14 LAB — POCT INR: INR: 2.8 (ref 2.0–3.0)

## 2023-08-14 NOTE — Patient Instructions (Signed)
 Continue warfarin 2 1/2 tablets daily except 2 tablets on Mondays. Be consistent with Vit K foods Recheck in 6 wks

## 2023-08-26 ENCOUNTER — Ambulatory Visit: Payer: Medicare Other | Admitting: Nurse Practitioner

## 2023-09-25 ENCOUNTER — Ambulatory Visit: Attending: Internal Medicine | Admitting: *Deleted

## 2023-09-25 DIAGNOSIS — I4819 Other persistent atrial fibrillation: Secondary | ICD-10-CM | POA: Insufficient documentation

## 2023-09-25 DIAGNOSIS — Z5181 Encounter for therapeutic drug level monitoring: Secondary | ICD-10-CM | POA: Diagnosis present

## 2023-09-25 LAB — POCT INR: INR: 3.3 — AB (ref 2.0–3.0)

## 2023-09-25 NOTE — Progress Notes (Signed)
 INR 3.3; Please see anticoagulation encounter

## 2023-09-25 NOTE — Patient Instructions (Signed)
 Hold warfarin tonight then resume 2 1/2 tablets daily except 2 tablets on Mondays. Be consistent with Vit K foods Recheck in 4 wks

## 2023-10-16 ENCOUNTER — Encounter: Payer: Self-pay | Admitting: Urology

## 2023-10-16 ENCOUNTER — Ambulatory Visit (INDEPENDENT_AMBULATORY_CARE_PROVIDER_SITE_OTHER): Admitting: Urology

## 2023-10-16 ENCOUNTER — Telehealth: Payer: Self-pay

## 2023-10-16 VITALS — BP 144/69 | HR 66

## 2023-10-16 DIAGNOSIS — R972 Elevated prostate specific antigen [PSA]: Secondary | ICD-10-CM | POA: Diagnosis not present

## 2023-10-16 LAB — URINALYSIS, ROUTINE W REFLEX MICROSCOPIC
Bilirubin, UA: NEGATIVE
Ketones, UA: NEGATIVE
Leukocytes,UA: NEGATIVE
Nitrite, UA: NEGATIVE
Protein,UA: NEGATIVE
RBC, UA: NEGATIVE
Specific Gravity, UA: 1.015 (ref 1.005–1.030)
Urobilinogen, Ur: 0.2 mg/dL (ref 0.2–1.0)
pH, UA: 6 (ref 5.0–7.5)

## 2023-10-16 MED ORDER — LEVOFLOXACIN 750 MG PO TABS: 750.0000 mg | ORAL_TABLET | Freq: Every day | ORAL | 0 refills | Status: DC

## 2023-10-16 NOTE — Patient Instructions (Addendum)
 Prostate Biopsy Instructions:  Appointment Time: 8:30am please arrive at 8:15am Appointment Date:11/11/2023 Location: [facility]   Please note this procedure is completed at [facility] Rad[facility] Department. Please arrive 15 minutes prior to your scheduled visit time to allow registration time. If you are late for your scheduled visit time, you may be asked to reschedule due to time restraints.  You will need to stop all aspirin  or blood thinners (aspirin , plavix, coumadin , warfarin, motrin, ibuprofen, advil, aleve, naproxen, naprosyn) for 7 days prior to the procedure.  If you have any questions about stopping these medications, please contact your prescribing doctor.  Our office will obtain clearance for your blood thinners to be held- please do not stop these medications until you have been given instruction to do so.  Having a light meal prior to the procedure is recommended.  If you are diabetic or have low blood sugar please bring a small snack or glucose tablet.  A Fleets enema is needed to be purchased over the counter at a local pharmacy and used 2 hours before you scheduled appointment.  This can be purchased over the counter at any pharmacy.  One antibiotic tablet has been sent to your pharmacy. Please pick this prescription up and take 2 hours prior to your scheduled biopsy appointment. You will also be given an additional antibiotic injection at the biopsy appointment.   Please bring someone with you to the procedure to drive you home if you are given a valium to take prior to your procedure.   If you have any questions or concerns, please feel free to call the office at [phone] or send a Mychart message.  What you can expect after your prostate biopsy:  You may notice blood in your urine or stool up to 1 week after your procedure. You may notice blood in your semen up to 1 month after your procedure.  If you are unable to pee afterwards or develop  a fever, please call our office at [phone] and hit option [phone] to speak with a clinical staff member.  Your physicain will notify you of your results.    Thank you, [facility]          Transrectal Ultrasound-Guided Prostate Biopsy An ultrasound is a test that uses sound waves to take pictures of the inside of the body. During this test, a small device (probe) with a gel is put inside your butt (rectum). The probe will take pictures of your prostate to help guide the needle. A needle will be inserted to take samples of tissues for testing. This test may be done to check for changes in the prostate, like prostate swelling or cancer. Tell your doctor about: Any allergies you have. All medicines you are taking. Any problems you or family members have had with anesthetic medicines. Any bleeding problems you have. Any surgeries you have had. Any medical conditions you have. Any prostate infections you have had. What are the risks? Infection. Bleeding from the butt. Blood in the pee (urine). Allergic reactions to medicines. Damage to nearby parts. Trouble peeing. Nerve damage. This is usually temporary. What happens before the test? Medicines Ask your doctor about changing or stopping: Your normal medicines. Vitamins, herbs, and supplements. Over-the-counter medicines. Do not take aspirin  or ibuprofen unless you are told to. General instructions Follow instructions from your doctor about what you cannot eat or drink. Liquid will be used to clear waste from your butt (enema). You may have a  blood sample taken. You may have a pee sample taken. For your safety, your doctor may: Ask you to wash with a soap that kills germs. Give you antibiotic medicine. If you will be going home right after the test, plan to have a responsible adult: Take you home from the hospital or clinic. You will not be allowed to drive. Care for you for the time you are told. What happens  during the test?  An IV tube will be put into one of your veins. You will be given one or both of these: A medicine to help you relax. A medicine to numb the area. You will be placed on your left side. Your knees will be bent toward your chest. A probe with gel on it will be placed in your butt. Pictures will be taken of your prostate and the area around it. Medicine will be used to numb your prostate. A needle will be placed in your butt and moved to your prostate. Prostate tissue will be taken out. The needle and probe will be taken out. The samples will be sent to a lab. The procedure may vary among doctors and hospitals. What happens after the test? You will be watched until you leave the hospital or clinic. This includes checking your blood pressure, heart rate, breathing rate, and blood oxygen level. You may have some pain in your butt. You will be given medicine for it. If you were given a sedative during the test, do not drive or use machines until your doctor says that it is safe. A sedative is a medicine that helps you relax. It is up to you to get the results of your test. Ask how to get your results when they are ready. Summary This test is usually done to check for prostate cancer. Before the test, ask your doctor about changing or stopping your medicines. You may have some pain in your butt. You will be given medicine for it. Plan to have a responsible adult take you home from the hospital or clinic. This information is not intended to replace advice given to you by your health care provider. Make sure you discuss any questions you have with your health care provider. Document Revised: 07/11/2020 Document Reviewed: 07/11/2020 Elsevier Patient Education  2024 [employer].

## 2023-10-16 NOTE — Progress Notes (Signed)
 10/16/2023 9:44 AM   Miguel Cox [birth year] [id number]  Referring provider: Drew Domino, NP [address], [address] [place],  [place] [address]  Elevated PSA   HPI: [name] is a 72yo here for evaluation of elevated PSA. PSA 20.2. He denies any significant LUTS. No family hx of prostate cancer.  He is on coumadin  for afib. No prior PSAs. He denies any bone pain. No history of UTi or prostatitis.    PMH: Past Medical History:  Diagnosis Date   Atrial fibrillation (HCC)    Diagnosed 2020   Diverticula of intestine    DJD (degenerative joint disease)    Dysrhythmia    Essential hypertension    Gout    History of kidney stones    OSA (obstructive sleep apnea)    Pt denies, Stop Bang 5   Osteoarthritis    Polio    Type 2 diabetes mellitus (HCC)     Surgical History: Past Surgical History:  Procedure Laterality Date   removal of toe nail     age 67 for fungus   REVERSE SHOULDER ARTHROPLASTY Left 06/12/2023   Procedure: ARTHROPLASTY, SHOULDER, TOTAL, REVERSE;  Surgeon: Miguel Kemps, MD;  Location: [facility];  Service: Orthopedics;  Laterality: Left;   SHOULDER ARTHROSCOPY Right 2011   right   TOTAL HIP ARTHROPLASTY Right 07/27/2021   Procedure: TOTAL HIP ARTHROPLASTY ANTERIOR APPROACH;  Surgeon: Miguel Cough, MD;  Location: [facility];  Service: Orthopedics;  Laterality: Right;   TOTAL KNEE ARTHROPLASTY Left 01/29/2009   Dr. Duwayne    Home Medications:  Allergies as of 10/16/2023       Reactions   Enalapril Cox   Penicillins Hives, Itching   Did it involve swelling of the face/tongue/throat, SOB, or low BP? No Did it involve sudden or severe rash/hives, skin peeling, or any reaction on the inside of your mouth or nose? No Did you need to seek medical attention at a hospital or doctor's office? No When did it last happen?       If all above answers are NO, may proceed with cephalosporin use. Tolerated Cephalosporin Date: 07/27/21.   Cefazolin  Rash   Other Swelling,  Rash   Pt can't eat anything from Miguel Cox, it causes swelling        Medication List        Accurate as of October 16, 2023  9:44 AM. If you have any questions, ask your nurse or doctor.          PAUSE taking these medications    glipiZIDE  5 MG 24 hr tablet Wait to take this until your doctor or other care provider tells you to start again. Commonly known as: GLUCOTROL  XL Take 5 mg by mouth daily.       TAKE these medications    amLODipine  10 MG tablet Commonly known as: NORVASC  Take 10 mg by mouth daily.   atorvastatin  40 MG tablet Commonly known as: LIPITOR Take 40 mg by mouth at bedtime.   chlorthalidone  25 MG tablet Commonly known as: HYGROTON  Take 25 mg by mouth daily.   colchicine  0.6 MG tablet Take 0.6 mg by mouth daily as needed (gout). Take 2 tablets and one hour later take a third tablet as needed for gout   cyclobenzaprine  10 MG tablet Commonly known as: FLEXERIL  Take 10 mg by mouth 3 (three) times daily as needed for muscle spasms.   empagliflozin  10 MG Tabs tablet Commonly known as: JARDIANCE  Take 1 tablet (10  mg total) by mouth daily before breakfast.   EPINEPHrine  0.3 mg/0.3 mL Soaj injection Commonly known as: EpiPen  2-Pak Inject 0.3 mLs (0.3 mg total) into the muscle once.   furosemide  20 MG tablet Commonly known as: Lasix  Take 1 tablet (20 mg total) by mouth daily.   Krill Oil 1000 MG Caps Take 1,000 mg by mouth daily.   losartan  100 MG tablet Commonly known as: COZAAR  Take 1 tablet (100 mg total) by mouth daily.   Metoprolol  Tartrate 75 MG Tabs Take 75 mg by mouth 2 (two) times daily.   multivitamin with minerals Tabs tablet Take 1 tablet by mouth daily.   ondansetron  4 MG tablet Commonly known as: Zofran  Take 1 tablet (4 mg total) by mouth every 8 (eight) hours as needed for nausea, vomiting or refractory nausea / vomiting.   oxyCODONE -acetaminophen  5-325 MG tablet Commonly known as: Percocet Take 1 tablet by  mouth every 4 (four) hours as needed for severe pain (pain score 7-10).   sildenafil  100 MG tablet Commonly known as: VIAGRA  Take 1 tablet by mouth daily as needed.   spironolactone  25 MG tablet Commonly known as: ALDACTONE  Take 1 tablet (25 mg total) by mouth daily.   warfarin 3 MG tablet Commonly known as: COUMADIN  Take as directed by the anticoagulation clinic. If you are unsure how to take this medication, talk to your nurse or doctor. Original instructions: TAKE 2 AND 1/2 TABLETS DAILY EXCEPT 3 TABLETS ON MONDAYS OR AS DIRECTED BY COUMADIN  CLINIC        Allergies:  Allergies  Allergen Reactions   Enalapril Cox   Penicillins Hives and Itching    Did it involve swelling of the face/tongue/throat, SOB, or low BP? No Did it involve sudden or severe rash/hives, skin peeling, or any reaction on the inside of your mouth or nose? No Did you need to seek medical attention at a hospital or doctor's office? No When did it last happen?       If all above answers are NO, may proceed with cephalosporin use. Tolerated Cephalosporin Date: 07/27/21.     Cefazolin  Rash   Other Swelling and Rash    Pt can't eat anything from Miguel Cox, it causes swelling    Family History: Family History  Problem Relation Age of Onset   Cancer Mother    Heart attack Brother 80    Social History:  reports that he has never smoked. He has never used smokeless tobacco. He reports that he does not currently use alcohol. He reports that he does not use drugs.  ROS: All other review of systems were reviewed and are negative except what is noted above in HPI  Physical Exam: BP (!) 144/69   Pulse 66   Constitutional:  Alert and oriented, No acute distress. HEENT: [place] AT, moist mucus membranes.  Trachea midline, no masses. Cardiovascular: No clubbing, cyanosis, or edema. Respiratory: Normal respiratory effort, no increased work of breathing. GI: Abdomen is soft, nontender, nondistended, no  abdominal masses GU: No CVA tenderness.  Lymph: No cervical or inguinal lymphadenopathy. Skin: No rashes, bruises or suspicious lesions. Neurologic: Grossly intact, no focal deficits, moving all 4 extremities. Psychiatric: Normal mood and affect.  Laboratory Data: Lab Results  Component Value Date   WBC 9.8 05/29/2023   HGB 13.1 05/29/2023   HCT 42.0 05/29/2023   MCV 86.2 05/29/2023   PLT 249 05/29/2023    Lab Results  Component Value Date   CREATININE 1.52 (H) 05/29/2023  No results found for: PSA  No results found for: TESTOSTERONE  Lab Results  Component Value Date   HGBA1C 7.2 (H) 06/06/2023    Urinalysis    Component Value Date/Time   COLORURINE YELLOW 04/02/2023 1110   APPEARANCEUR CLEAR 04/02/2023 1110   LABSPEC 1.013 04/02/2023 1110   PHURINE 7.0 04/02/2023 1110   GLUCOSEU NEGATIVE 04/02/2023 1110   HGBUR NEGATIVE 04/02/2023 1110   BILIRUBINUR NEGATIVE 04/02/2023 1110   KETONESUR NEGATIVE 04/02/2023 1110   PROTEINUR NEGATIVE 04/02/2023 1110   UROBILINOGEN 1.0 02/16/2010 1530   NITRITE NEGATIVE 04/02/2023 1110   LEUKOCYTESUR NEGATIVE 04/02/2023 1110    No results found for: LABMICR, WBCUA, RBCUA, LABEPIT, MUCUS, BACTERIA  Pertinent Imaging:  No results found for this or any previous visit.  No results found for this or any previous visit.  No results found for this or any previous visit.  No results found for this or any previous visit.  No results found for this or any previous visit.  No results found for this or any previous visit.  No results found for this or any previous visit.  No results found for this or any previous visit.   Assessment & Plan:    1. Elevated PSA (Primary) The patient and I talked about etiologies of elevated PSA.  We discussed the possible relationship between elevated PSA, prostate cancer, BPH, prostatitis, and UTI.   Conservative treatment of elevated PSA with watchful waiting was discussed  with the patient.  All questions were answered.        All of the risks and benefits along with alternatives to prostate biopsy were discussed with the patient.  The patient gave fully informed consent to proceed with a transrectal ultrasound guided biopsy of the prostate for the evaluation of their evated PSA.  Prostate biopsy instructions and antibiotics were given to the patient.  - Urinalysis, Routine w reflex microscopic   No follow-ups on file.  Belvie Clara, MD  [facility]

## 2023-10-16 NOTE — Telephone Encounter (Signed)
   Pre-operative Risk Assessment    Patient Name: Miguel Cox  DOB: 11/04/1950 MRN: [id number] Date of last office visit: 05/22/23  Lorette Kapur Date of next office visit: 10/22/23  Almarie Crate Request for Surgical Clearance    Procedure:  Prostate Biopsy Date of Surgery:  Clearance 11/11/23                                Surgeon:  Dr. MYRTIS Clara Surgeon's Group or Practice Name:  [facility] Phone number:  [phone] Fax number:  [phone] Type of Clearance Requested:   - Medical  - Pharmacy:  Hold Warfarin (Coumadin ) Hold coumadin  5 days prior Type of Anesthesia:  Local    Additional requests/questions:    Bonney Arlyne LITTIE Kallie   10/16/2023, 5:02 PM

## 2023-10-22 ENCOUNTER — Ambulatory Visit: Attending: Nurse Practitioner | Admitting: Nurse Practitioner

## 2023-10-22 ENCOUNTER — Encounter: Payer: Self-pay | Admitting: Nurse Practitioner

## 2023-10-22 ENCOUNTER — Ambulatory Visit (INDEPENDENT_AMBULATORY_CARE_PROVIDER_SITE_OTHER): Admitting: *Deleted

## 2023-10-22 VITALS — BP 108/68 | HR 61 | Ht 72.0 in | Wt 293.6 lb

## 2023-10-22 DIAGNOSIS — I10 Essential (primary) hypertension: Secondary | ICD-10-CM | POA: Insufficient documentation

## 2023-10-22 DIAGNOSIS — E785 Hyperlipidemia, unspecified: Secondary | ICD-10-CM | POA: Diagnosis not present

## 2023-10-22 DIAGNOSIS — Z01810 Encounter for preprocedural cardiovascular examination: Secondary | ICD-10-CM | POA: Insufficient documentation

## 2023-10-22 DIAGNOSIS — I4819 Other persistent atrial fibrillation: Secondary | ICD-10-CM | POA: Diagnosis not present

## 2023-10-22 DIAGNOSIS — E669 Obesity, unspecified: Secondary | ICD-10-CM | POA: Diagnosis present

## 2023-10-22 DIAGNOSIS — Z5181 Encounter for therapeutic drug level monitoring: Secondary | ICD-10-CM | POA: Insufficient documentation

## 2023-10-22 DIAGNOSIS — I5030 Unspecified diastolic (congestive) heart failure: Secondary | ICD-10-CM

## 2023-10-22 LAB — POCT INR: INR: 4 — AB (ref 2.0–3.0)

## 2023-10-22 MED ORDER — ATORVASTATIN CALCIUM 80 MG PO TABS: 80.0000 mg | ORAL_TABLET | Freq: Every day | ORAL | 3 refills | Status: AC

## 2023-10-22 NOTE — Progress Notes (Unsigned)
 Cardiology Office Note:  .   Date: 10/22/2023 ID:  Miguel Cox, DOB 1950-04-22, MRN [id number] PCP: [facility] Providers Cardiologist:  Diannah SHAUNNA Maywood, MD    History of Present Illness: .   Miguel Cox is a 73 y.o. male with a PMH of persistent A-fib, hypertension, type 2 diabetes, OSA on CPAP, who presents today for preoperative cardiovascular risk assessment and regular follow-up.  Last seen by Dr. Mallipeddi on March 05, 2022.  Was overall doing well at the time.  He had stopped taking Eliquis  due to cost and had started a baby aspirin  daily.  Was started on Coumadin  for stroke prophylaxis.  Amlodipine  was stopped due to bilateral lower extremity swelling.  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  Losartan  was increased to 50 mg daily.  02/22/2023 - Today he presents for preoperative cardiovascular risk assessment.  He is pending left reverse total shoulder arthroplasty for surgery date of TBD.  Surgeon will be Dr. Garnette Her of [facility].  Our office has been contacting regarding clearance.  He states he has been doing well since last office visit.  Denies any acute cardiac complaints or concerns.  Says SBP averages upper 130s to low 140s at home. Denies any chest pain, shortness of breath, palpitations, syncope, presyncope, dizziness, orthopnea, PND, swelling or significant weight changes, acute bleeding, or claudication.    10/22/2023 - Here for follow-up. Doing well since his shoulder surgery, now has returned to riding his motorcycle. Denies any acute cardiac complaints or issues. Denies any chest pain, shortness of breath, palpitations, syncope, presyncope, dizziness, orthopnea, PND, swelling or significant weight changes, acute bleeding, or claudication. Tells me he is scheduled for biopsy of his prostate next month.    ROS: Negative.  See HPI. 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   Studies Reviewed: SABRA    EKG: EKG is not ordered today.   Echo 03/2023:   1. Poor acoustic windows limit study, even with Defininty RV difficult to  see.   2. Left ventricular ejection fraction, by estimation, is 55%. The left  ventricular internal cavity size was mildly dilated. Left ventricular  diastolic parameters are indeterminate.   3. Right ventricular systolic function is moderately reduced. The right  ventricular size is mildly enlarged.   4. The mitral valve is normal in structure. Trivial mitral valve  regurgitation.   5. The aortic valve is tricuspid. Aortic valve regurgitation is trivial.  Aortic valve sclerosis/calcification is present, without any evidence of  aortic stenosis.   6. The inferior vena cava is dilated in size with >50% respiratory  variability, suggesting right atrial pressure of 8 mmHg.   FINDINGS   Left Ventricle: Left ventricular ejection fraction, by estimation, is  55%. Definity  contrast agent was given IV to delineate the left  ventricular endocardial borders. The left ventricular internal cavity size  was mildly dilated. There is no left  ventricular hypertrophy. Left ventricular diastolic parameters are  indeterminate.  Echo 08/2019:   1. Left ventricular ejection fraction, by estimation, is 60 to 65%. The  left ventricle has normal function. The left ventricle has no regional  wall motion abnormalities. There is moderate left ventricular hypertrophy.  Left ventricular diastolic  parameters are indeterminate.   2. Right ventricular systolic function is normal. The right ventricular  size is  normal.   3. Left atrial size was severely dilated.   4. Right atrial size was mild to moderately dilated.   5. The mitral valve is normal in structure. Trivial mitral valve  regurgitation. No evidence of mitral stenosis.   6. The aortic valve is tricuspid. Aortic valve regurgitation is  not  visualized. No aortic stenosis is present.   7. Aortic dilatation noted. 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   8. The inferior vena cava is normal in size with greater than 50%  respiratory variability, suggesting right atrial pressure of 3 mmHg.   Physical Exam:   VS:  BP 108/68   Pulse 61   Ht 6' (1.829 m)   Wt 293 lb 9.6 oz (133.2 kg)   SpO2 96%   BMI 39.82 kg/m    Wt Readings from Last 3 Encounters:  10/22/23 293 lb 9.6 oz (133.2 kg)  06/12/23 270 lb 9.6 oz (122.7 kg)  06/06/23 270 lb 9.6 oz (122.7 kg)    GEN: Obese, 73 year old male in no acute distress NECK: No JVD; No carotid bruits CARDIAC: S1/S2, irregularly irregular rhythm, no murmurs, rubs, gallops RESPIRATORY:  Clear to auscultation without rales, wheezing or rhonchi  ABDOMEN: Soft, non-tender, non-distended EXTREMITIES:  No edema; No deformity   ASSESSMENT AND PLAN: .    Preoperative cardiovascular risk assessment [name] perioperative risk of a major cardiac event is 0.4% according to the Revised Cardiac Risk Index (RCRI).  Therefore, he is at low risk for perioperative complications.   His functional capacity is excellent at 6.61 METs according to the Duke Activity Status Index (DASI). Recommendations: According to ACC/AHA guidelines, no further cardiovascular testing needed.  The patient may proceed to surgery at acceptable risk.   Antiplatelet and/or Anticoagulation Recommendations: Will consult clinical pharm D about holding time of Coumadin .   2. Persistent to permanent A-fib Denies any tachycardia or palpitations.  Heart rate is well-controlled.  Continue Lopressor .  Continue to follow-up with Coumadin  clinic.  Denies any bleeding issues on Coumadin .  Heart healthy diet and regular cardiovascular exercise encouraged.   3. HTN BP well controlled. Discussed SBP goal is less than 140. Discussed to monitor BP at home at least 2 hours after medications and sitting for 5-10  minutes.  Continue current medication regimen.  Discussed to contact our office if SBP is consistently greater than 140.  He verbalized understanding. Heart healthy diet and regular cardiovascular exercise encouraged.   4.  Hyperlipidemia Most recent LDL 109, compliant with Lipitor. Will increase Lipitor to 80 mg daily and will obtain CMET and FLP in 6 weeks. Heart healthy diet and regular cardiovascular exercise encouraged.   5. Obesity Weight loss via diet and exercise encouraged. Discussed the impact being overweight would have on cardiovascular risk.  I spent a total duration of 30 minutes reviewing prior notes, reviewing outside records including  labs, face-to-face counseling of medical condition, pathophysiology, evaluation, management, and documenting the findings in the note.   Dispo: Follow-up with Dr. Mallipeddi or APP in 6 months or sooner if anything changes.  Signed, Almarie Crate, NP

## 2023-10-22 NOTE — Patient Instructions (Addendum)
 Medication Instructions:  Your physician has recommended you make the following change in your medication:  Please Increase Atorvastatin  to 80 Mg daily   Labwork: In 6 weeks at [facility]   Testing/Procedures: None   Follow-Up: Your physician recommends that you schedule a follow-up appointment in: 6 Months   Any Other Special Instructions Will Be Listed Below (If Applicable).  If you need a refill on your cardiac medications before your next appointment, please call your pharmacy.

## 2023-10-22 NOTE — Progress Notes (Signed)
 INR 4.0; Please see anticoagulation encounter

## 2023-10-22 NOTE — Patient Instructions (Signed)
 Hold warfarin tonight then decrease dose to 2 1/2 tablets daily except 2 tablets on Mondays and Thursdays. Be consistent with Vit K foods Recheck in 3 wks

## 2023-10-23 ENCOUNTER — Encounter

## 2023-10-24 ENCOUNTER — Telehealth: Payer: Self-pay | Admitting: Nurse Practitioner

## 2023-10-24 NOTE — Telephone Encounter (Signed)
 Patient informed and verbalized understanding of plan.

## 2023-10-24 NOTE — Telephone Encounter (Signed)
-----   Message from Almarie Crate sent at 10/24/2023 11:57 AM EDT ----- Please let patient know that I have heard back from our clinical pharmacist who confirmed he only needs to hold Coumadin  for 5 days prior to his prostate biopsy.   Thanks!   Best, Almarie Crate, NP ----- Message ----- From: Geraldine Eleanor BIRCH, RPH-CPP Sent: 10/24/2023  11:52 AM EDT To: Almarie Crate, NP  Yes, 5 days prior. No bridge is needed. ----- Message ----- From: Crate Almarie, NP Sent: 10/23/2023  11:07 AM EDT To: [facility] Preop Pharm  Please comment on holding time for Coumadin . Pt was told 5 days prior to prostate biopsy scheduled for next month.   Thank you!   Best, Almarie Crate, NP

## 2023-10-24 NOTE — Telephone Encounter (Signed)
 Patient with diagnosis of A Fib on warfarin for anticoagulation.    Procedure: prostate biopsy Date of procedure: 11/11/23   CHA2DS2-VASc Score = 5  This indicates a 7.2% annual risk of stroke. The patient's score is based upon: CHF History: 1 HTN History: 1 Diabetes History: 1 Stroke History: 0 Vascular Disease History: 1 Age Score: 1 Gender Score: 0   CrCl 80 ml/min Platelet count 186K  Patient has not had an Afib/aflutter ablation or Watchman within the last 3 months or DCCV within the last 30 days    Per office protocol, patient can hold warfarin for 5 days prior to procedure.    Patient will not need bridging with Lovenox (enoxaparin) around procedure.  **This guidance is not considered finalized until pre-operative APP has relayed final recommendations.**

## 2023-10-25 NOTE — Telephone Encounter (Signed)
   Patient Name: Miguel Cox  DOB: [birth year] MRN: [id number]  Primary Cardiologist: Diannah SHAUNNA Maywood, MD  Chart reviewed as part of pre-operative protocol coverage. Given past medical history and time since last visit, based on ACC/AHA guidelines, Miguel Cox is at acceptable risk for the planned procedure without further cardiovascular testing.   Preoperative cardiovascular risk assessment Miguel Cox perioperative risk of a major cardiac event is 0.4% according to the Revised Cardiac Risk Index (RCRI).  Therefore, he is at low risk for perioperative complications.   His functional capacity is excellent at 6.61 METs according to the Duke Activity Status Index (DASI). Recommendations: According to ACC/AHA guidelines, no further cardiovascular testing needed.  The patient may proceed to surgery at acceptable risk.   Antiplatelet and/or Anticoagulation Recommendations: According to our pharm D, pt may hold Coumadin  5 days prior to procedure and no bridge is necessary. Will update patient.  The patient was advised that if he develops new symptoms prior to surgery to contact our office to arrange for a follow-up visit, and he verbalized understanding.  I will route this recommendation to the requesting party via Epic fax function and remove from pre-op pool.  Please call with questions.  Lamarr Satterfield, NP 10/25/2023, 10:26 AM

## 2023-10-30 ENCOUNTER — Other Ambulatory Visit: Payer: Self-pay | Admitting: Family

## 2023-11-11 ENCOUNTER — Ambulatory Visit (HOSPITAL_COMMUNITY)
Admission: RE | Admit: 2023-11-11 | Discharge: 2023-11-11 | Disposition: A | Discharge status: Home / Self Care | Source: Ambulatory Visit | Attending: Urology | Admitting: Urology

## 2023-11-11 ENCOUNTER — Other Ambulatory Visit: Payer: Self-pay | Admitting: Urology

## 2023-11-11 ENCOUNTER — Encounter (HOSPITAL_COMMUNITY): Payer: Self-pay

## 2023-11-11 ENCOUNTER — Ambulatory Visit (HOSPITAL_BASED_OUTPATIENT_CLINIC_OR_DEPARTMENT_OTHER): Admitting: Urology

## 2023-11-11 VITALS — BP 149/65 | HR 68 | Temp 98.0°F | Resp 18

## 2023-11-11 DIAGNOSIS — C61 Malignant neoplasm of prostate: Secondary | ICD-10-CM | POA: Diagnosis not present

## 2023-11-11 DIAGNOSIS — R972 Elevated prostate specific antigen [PSA]: Secondary | ICD-10-CM

## 2023-11-11 DIAGNOSIS — N4231 Prostatic intraepithelial neoplasia: Secondary | ICD-10-CM | POA: Diagnosis not present

## 2023-11-11 MED ORDER — LIDOCAINE HCL (PF) 2 % IJ SOLN
10.0000 mL | Freq: Once | INTRAMUSCULAR | Status: AC
  Administered 2023-11-11: 10 mL

## 2023-11-11 MED ORDER — GENTAMICIN SULFATE 40 MG/ML IJ SOLN
INTRAMUSCULAR | Status: AC
Start: 2023-11-11 — End: 2023-11-11
  Filled 2023-11-11: qty 2

## 2023-11-11 MED ORDER — GENTAMICIN SULFATE 40 MG/ML IJ SOLN
80.0000 mg | Freq: Once | INTRAMUSCULAR | Status: AC
Start: 2023-11-11 — End: 2023-11-11
  Administered 2023-11-11: 80 mg via INTRAMUSCULAR

## 2023-11-11 MED ORDER — LIDOCAINE HCL (PF) 2 % IJ SOLN
INTRAMUSCULAR | Status: AC
  Filled 2023-11-11: qty 10

## 2023-11-11 NOTE — Progress Notes (Signed)
 PT tolerated prostate biopsy procedure and antibiotic injection well today. Labs obtained and sent for pathology by Lexi from ultrasound. PT ambulatory at discharge with no acute distress noted and verbalized understanding of discharge instructions. PT to follow up with urologist as scheduled on 11/22/23 @1250 .

## 2023-11-12 ENCOUNTER — Ambulatory Visit: Attending: Internal Medicine | Admitting: *Deleted

## 2023-11-12 DIAGNOSIS — I4819 Other persistent atrial fibrillation: Secondary | ICD-10-CM | POA: Insufficient documentation

## 2023-11-12 DIAGNOSIS — Z5181 Encounter for therapeutic drug level monitoring: Secondary | ICD-10-CM | POA: Diagnosis present

## 2023-11-12 LAB — POCT INR: INR: 1.1 — AB (ref 2.0–3.0)

## 2023-11-12 LAB — SURGICAL PATHOLOGY

## 2023-11-12 NOTE — Progress Notes (Signed)
 INR 1.1 Please see anticoagulation encounter

## 2023-11-12 NOTE — Patient Instructions (Signed)
 Has been off warfarin x 5 days for prostate Bx.   Restart warfarin 2 1/2 tablets daily except 2 tablets on Mondays and Thursdays. Be consistent with Vit K foods Recheck in 2 wks

## 2023-11-19 ENCOUNTER — Encounter: Payer: Self-pay | Admitting: Urology

## 2023-11-19 NOTE — Patient Instructions (Signed)
 Transrectal Ultrasound-Guided Prostate Biopsy, Care After What can I expect after the procedure? After the procedure, it is common to have: Pain and discomfort near your butt (rectum), especially while sitting. Pink-colored pee (urine). This is due to small amounts of blood in your pee. A burning feeling while peeing. Blood in your poop (stool). Bleeding from your butt. Blood in your semen. Follow these instructions at home: Medicines Take over-the-counter and prescription medicines only as told by your doctor. If you were given a sedative during your procedure, do not drive or use machines until your doctor says that it is safe. A sedative is a medicine that helps you relax. If you were prescribed an antibiotic medicine, take it as told by your doctor. Do not stop taking it even if you start to feel better. Activity  Return to your normal activities when your doctor says that it is safe. Ask your doctor when it is okay for you to have sex. You may have to avoid lifting. Ask your doctor how much you can safely lift. General instructions  Drink enough water to keep your pee pale yellow. Watch your pee, poop, and semen for new bleeding or bleeding that gets worse. Keep all follow-up visits. Contact a doctor if: You have any of these: Blood clots in your pee or poop. Blood in your pee more than 2 weeks after the procedure. Blood in your semen more than 2 months after the procedure. New or worse bleeding in your pee, poop, or semen. Very bad belly pain. Your pee smells bad or unusual. You have trouble peeing. Your lower belly feels firm. You have problems getting an erection. You feel like you may vomit (are nauseous), or you vomit. Get help right away if: You have a fever or chills. You have bright red pee. You have very bad pain that does not get better with medicine. You cannot pee. Summary After this procedure, it is common to have pain and discomfort near your butt,  especially while sitting. You may have blood in your pee and poop. It is common to have blood in your semen. Get help right away if you have a fever or chills. This information is not intended to replace advice given to you by your health care provider. Make sure you discuss any questions you have with your health care provider. Document Revised: 07/11/2020 Document Reviewed: 07/11/2020 Elsevier Patient Education  2024 [employer].

## 2023-11-19 NOTE — Progress Notes (Signed)
 Prostate Biopsy Procedure   Informed consent was obtained after discussing risks/benefits of the procedure.  A time out was performed to ensure correct patient identity.  Pre-Procedure: - Last PSA Level: No results found for: PSA - Gentamicin given prophylactically - Levaquin  500 mg administered PO -Transrectal Ultrasound performed revealing a 42.7 gm prostate -No significant hypoechoic or median lobe noted  Procedure: - Prostate block performed using 10 cc 1% lidocaine  and biopsies taken from sextant areas, a total of 12 under ultrasound guidance.  Post-Procedure: - Patient tolerated the procedure well - He was counseled to seek immediate medical attention if experiences any severe pain, significant bleeding, or fevers - Return in one week to discuss biopsy results

## 2023-11-22 ENCOUNTER — Ambulatory Visit (INDEPENDENT_AMBULATORY_CARE_PROVIDER_SITE_OTHER): Admitting: Urology

## 2023-11-22 ENCOUNTER — Encounter: Payer: Self-pay | Admitting: Urology

## 2023-11-22 DIAGNOSIS — R972 Elevated prostate specific antigen [PSA]: Secondary | ICD-10-CM

## 2023-11-22 DIAGNOSIS — C61 Malignant neoplasm of prostate: Secondary | ICD-10-CM

## 2023-11-22 NOTE — Progress Notes (Signed)
 11/22/2023 12:58 PM   Miguel Cox 09-13-1950 [id number]  Referring provider: [facility] [address] [place],  [place] [address]  Followup prostate biopsy   HPI: Miguel Cox is a 72yo here for followup after prostate biopsy. Biopsy revealed Gleason 3+4=7 in 7/12 cores and Gleason 3+3=6 in 1/12 cores. Prostate volume 42.7. PSA 20.2. No significant LUTS.    PMH: Past Medical History:  Diagnosis Date   Atrial fibrillation (HCC)    Diagnosed 2020   Diverticula of intestine    DJD (degenerative joint disease)    Dysrhythmia    Essential hypertension    Gout    History of kidney stones    OSA (obstructive sleep apnea)    Pt denies, Stop Bang 5   Osteoarthritis    Polio    Type 2 diabetes mellitus (HCC)     Surgical History: Past Surgical History:  Procedure Laterality Date   removal of toe nail     age 1 for fungus   REVERSE SHOULDER ARTHROPLASTY Left 06/12/2023   Procedure: ARTHROPLASTY, SHOULDER, TOTAL, REVERSE;  Surgeon: Kay Kemps, MD;  Location: [facility];  Service: Orthopedics;  Laterality: Left;   SHOULDER ARTHROSCOPY Right 2011   right   TOTAL HIP ARTHROPLASTY Right 07/27/2021   Procedure: TOTAL HIP ARTHROPLASTY ANTERIOR APPROACH;  Surgeon: Ernie Cough, MD;  Location: [facility];  Service: Orthopedics;  Laterality: Right;   TOTAL KNEE ARTHROPLASTY Left 01/29/2009   Dr. Duwayne    Home Medications:  Allergies as of 11/22/2023       Reactions   Enalapril Cough   Penicillins Hives, Itching   Did it involve swelling of the face/tongue/throat, SOB, or low BP? No Did it involve sudden or severe rash/hives, skin peeling, or any reaction on the inside of your mouth or nose? No Did you need to seek medical attention at a hospital or doctor's office? No When did it last happen?       If all above answers are NO, may proceed with cephalosporin use. Tolerated Cephalosporin Date: 07/27/21.   Cefazolin  Rash   Other Swelling, Rash   Pt can't eat anything from  Miguel Cox, it causes swelling        Medication List        Accurate as of November 22, 2023 12:58 PM. If you have any questions, ask your nurse or doctor.          PAUSE taking these medications    glipiZIDE  5 MG 24 hr tablet Wait to take this until your doctor or other care provider tells you to start again. Commonly known as: GLUCOTROL  XL Take 5 mg by mouth daily.       TAKE these medications    amLODipine  10 MG tablet Commonly known as: NORVASC  Take 10 mg by mouth daily.   atorvastatin  80 MG tablet Commonly known as: LIPITOR Take 1 tablet (80 mg total) by mouth at bedtime.   chlorthalidone  25 MG tablet Commonly known as: HYGROTON  Take 25 mg by mouth daily.   colchicine  0.6 MG tablet Take 0.6 mg by mouth daily as needed (gout). Take 2 tablets and one hour later take a third tablet as needed for gout   cyclobenzaprine  10 MG tablet Commonly known as: FLEXERIL  Take 10 mg by mouth 3 (three) times daily as needed for muscle spasms.   EPINEPHrine  0.3 mg/0.3 mL Soaj injection Commonly known as: EpiPen  2-Pak Inject 0.3 mLs (0.3 mg total) into the muscle once.   furosemide   20 MG tablet Commonly known as: Lasix  Take 1 tablet (20 mg total) by mouth daily.   Jardiance  10 MG Tabs tablet Generic drug: empagliflozin  TAKE 1 TABLET BY MOUTH DAILY BEFORE BREAKFAST.   Krill Oil 1000 MG Caps Take 1,000 mg by mouth daily.   levofloxacin  750 MG tablet Commonly known as: Levaquin  Take 1 tablet (750 mg total) by mouth daily. Take 1 hour prior to your prostate biopsy procedure.   losartan  100 MG tablet Commonly known as: COZAAR  Take 1 tablet (100 mg total) by mouth daily.   Metoprolol  Tartrate 75 MG Tabs Take 75 mg by mouth 2 (two) times daily.   multivitamin with minerals Tabs tablet Take 1 tablet by mouth daily.   ondansetron  4 MG tablet Commonly known as: Zofran  Take 1 tablet (4 mg total) by mouth every 8 (eight) hours as needed for nausea, vomiting or  refractory nausea / vomiting.   oxyCODONE -acetaminophen  5-325 MG tablet Commonly known as: Percocet Take 1 tablet by mouth every 4 (four) hours as needed for severe pain (pain score 7-10).   sildenafil  100 MG tablet Commonly known as: VIAGRA  Take 1 tablet by mouth daily as needed.   spironolactone  25 MG tablet Commonly known as: ALDACTONE  Take 1 tablet (25 mg total) by mouth daily.   warfarin 3 MG tablet Commonly known as: COUMADIN  Take as directed by the anticoagulation clinic. If you are unsure how to take this medication, talk to your nurse or doctor. Original instructions: TAKE 2 AND 1/2 TABLETS DAILY EXCEPT 3 TABLETS ON MONDAYS OR AS DIRECTED BY COUMADIN  CLINIC        Allergies:  Allergies  Allergen Reactions   Enalapril Cough   Penicillins Hives and Itching    Did it involve swelling of the face/tongue/throat, SOB, or low BP? No Did it involve sudden or severe rash/hives, skin peeling, or any reaction on the inside of your mouth or nose? No Did you need to seek medical attention at a hospital or doctor's office? No When did it last happen?       If all above answers are NO, may proceed with cephalosporin use. Tolerated Cephalosporin Date: 07/27/21.     Cefazolin  Rash   Other Swelling and Rash    Pt can't eat anything from Miguel Cox, it causes swelling    Family History: Family History  Problem Relation Age of Onset   Cancer Mother    Heart attack Brother 82    Social History:  reports that he has never smoked. He has never used smokeless tobacco. He reports that he does not currently use alcohol. He reports that he does not use drugs.  ROS: All other review of systems were reviewed and are negative except what is noted above in HPI  Physical Exam: There were no vitals taken for this visit.  Constitutional:  Alert and oriented, No acute distress. HEENT: [place] AT, moist mucus membranes.  Trachea midline, no masses. Cardiovascular: No clubbing, cyanosis, or  edema. Respiratory: Normal respiratory effort, no increased work of breathing. GI: Abdomen is soft, nontender, nondistended, no abdominal masses GU: No CVA tenderness.  Lymph: No cervical or inguinal lymphadenopathy. Skin: No rashes, bruises or suspicious lesions. Neurologic: Grossly intact, no focal deficits, moving all 4 extremities. Psychiatric: Normal mood and affect.  Laboratory Data: Lab Results  Component Value Date   WBC 9.8 05/29/2023   HGB 13.1 05/29/2023   HCT 42.0 05/29/2023   MCV 86.2 05/29/2023   PLT 249 05/29/2023    Lab  Results  Component Value Date   CREATININE 1.52 (H) 05/29/2023    No results found for: PSA  No results found for: TESTOSTERONE  Lab Results  Component Value Date   HGBA1C 7.2 (H) 06/06/2023    Urinalysis    Component Value Date/Time   COLORURINE YELLOW 04/02/2023 1110   APPEARANCEUR Clear 10/16/2023 0934   LABSPEC 1.013 04/02/2023 1110   PHURINE 7.0 04/02/2023 1110   GLUCOSEU 3+ (A) 10/16/2023 0934   HGBUR NEGATIVE 04/02/2023 1110   BILIRUBINUR Negative 10/16/2023 0934   KETONESUR NEGATIVE 04/02/2023 1110   PROTEINUR Negative 10/16/2023 0934   PROTEINUR NEGATIVE 04/02/2023 1110   UROBILINOGEN 1.0 02/16/2010 1530   NITRITE Negative 10/16/2023 0934   NITRITE NEGATIVE 04/02/2023 1110   LEUKOCYTESUR Negative 10/16/2023 0934   LEUKOCYTESUR NEGATIVE 04/02/2023 1110    Lab Results  Component Value Date   LABMICR Comment 10/16/2023    Pertinent Imaging:  No results found for this or any previous visit.  No results found for this or any previous visit.  No results found for this or any previous visit.  No results found for this or any previous visit.  No results found for this or any previous visit.  No results found for this or any previous visit.  No results found for this or any previous visit.  No results found for this or any previous visit.   Assessment & Plan:    1.  Prostate cancer [facility]) I discussed  the natural history of intermediate risk prostate cancer with the patient and the various treatment options including active surveillance, RALP, IMRT, brachytherapy, cryotherapy, HIFU and ADT. After discussing the options the patient elects for radiation therapy. I will refer his to Dr. Patrcia for consideration of radiation therapy. We will obtain a PSMA PET prior to his appointment with Dr. Patrcia No follow-ups on file.  Belvie Clara, MD  [facility] [place]

## 2023-11-22 NOTE — Patient Instructions (Signed)

## 2023-11-25 ENCOUNTER — Encounter: Payer: Self-pay | Admitting: Student

## 2023-11-26 ENCOUNTER — Ambulatory Visit: Attending: Internal Medicine

## 2023-11-27 ENCOUNTER — Ambulatory Visit: Attending: Cardiology | Admitting: *Deleted

## 2023-11-27 DIAGNOSIS — I4819 Other persistent atrial fibrillation: Secondary | ICD-10-CM | POA: Insufficient documentation

## 2023-11-27 DIAGNOSIS — Z5181 Encounter for therapeutic drug level monitoring: Secondary | ICD-10-CM | POA: Insufficient documentation

## 2023-11-27 LAB — POCT INR: INR: 3.1 — AB (ref 2.0–3.0)

## 2023-11-27 NOTE — Patient Instructions (Signed)
 Continue warfarin 2 1/2 tablets daily except 2 tablets on Mondays and Thursdays. Be consistent with Vit K foods Recheck in 4 wks Eat Vit K food tonight

## 2023-11-27 NOTE — Progress Notes (Signed)
 INR 3.1  Please see anticoagulation encounter

## 2023-12-02 NOTE — Progress Notes (Signed)
 GU Location of Tumor / Histology: Prostate Ca  If Prostate Cancer, Gleason Score is (3 + 4) and PSA is (20.2 on 08/23/2023)  Miguel Cox presented as referral from Dr. Belvie Clara [facility] [place]) elevated PSA.  Biopsies     12/05/2023 Dr. Belvie L.McKenzie NM PET (PSMA) Skull to Mid Thigh CLINICAL DATA:   Past/Anticipated interventions by urology, if any:  Dr. Belvie FREDRIK Clara   Past/Anticipated interventions by medical oncology, if any: NA  Weight changes, if any: No  IPSS:  2 SHIM:  5  Bowel/Bladder complaints, if any:  No  Nausea/Vomiting, if any:  No  Pain issues, if any:  0/10  SAFETY ISSUES: Prior radiation?  No Pacemaker/ICD? No Possible current pregnancy? Male Is the patient on methotrexate? No  Current Complaints / other details: None  30 minutes spent total, including time for meaningful use questions, reviewing medication, as well as spent in face-to-face time in nurse evaluation with the patient.

## 2023-12-05 ENCOUNTER — Ambulatory Visit (HOSPITAL_COMMUNITY)
Admission: RE | Admit: 2023-12-05 | Discharge: 2023-12-05 | Disposition: A | Discharge status: Home / Self Care | Source: Ambulatory Visit | Attending: Urology | Admitting: Urology

## 2023-12-05 ENCOUNTER — Other Ambulatory Visit: Payer: Self-pay | Admitting: Internal Medicine

## 2023-12-05 DIAGNOSIS — C61 Malignant neoplasm of prostate: Secondary | ICD-10-CM | POA: Diagnosis present

## 2023-12-05 DIAGNOSIS — I4819 Other persistent atrial fibrillation: Secondary | ICD-10-CM

## 2023-12-05 MED ORDER — FLOTUFOLASTAT F 18 GALLIUM 296-5846 MBQ/ML IV SOLN
7.4700 | Freq: Once | INTRAVENOUS | Status: AC
  Administered 2023-12-05: 7.47 via INTRAVENOUS
  Filled 2023-12-05: qty 8

## 2023-12-08 ENCOUNTER — Encounter: Payer: Self-pay | Admitting: Urology

## 2023-12-08 DIAGNOSIS — C61 Malignant neoplasm of prostate: Secondary | ICD-10-CM | POA: Insufficient documentation

## 2023-12-08 NOTE — Progress Notes (Signed)
 Radiation Oncology         [phone] ________________________________  Initial Outpatient Consultation  Name: Miguel Cox MRN: [id number]  Date: 12/09/2023  DOB: 09-13-1950  RR:Mnrxpwhyjf, [facility]  McKenzie, Belvie CROME, MD   REFERRING PHYSICIAN: Sherrilee Belvie CROME, MD  DIAGNOSIS: 73 y.o. gentleman with Stage T1c adenocarcinoma of the prostate with Gleason score of 3+4, and PSA of 20.2.  No diagnosis found.  HISTORY OF PRESENT ILLNESS: Miguel Cox is a 73 y.o. male with a diagnosis of prostate cancer. He was noted to have an elevated PSA of 20.2 by his primary care physician, Lauraine Fus, NP.  Accordingly, he was referred for evaluation in urology by Dr. Sherrilee on 10/16/23. The patient proceeded to transrectal ultrasound with 12 biopsies of the prostate on 11/11/23.  The prostate volume measured 42.7 cc.  Out of 12 core biopsies, 8 were positive, including all 6 left-sided cores.  The maximum Gleason score was 3+4, and this was seen in right mid, left apex, left mid, left base, left apex lateral, right mid lateral, and left base lateral. Additionally, Gleason 3+3 was seen in left mid lateral (with perineural invasion).  Given the significant elevation in his PSA, he underwent a staging PSMA PET scan on 12/05/23. {Results are pending.}  The patient reviewed the biopsy results with his urologist and he has kindly been referred today for discussion of potential radiation treatment options.   PREVIOUS RADIATION THERAPY: No  PAST MEDICAL HISTORY:  Past Medical History:  Diagnosis Date   Atrial fibrillation (HCC)    Diagnosed 2020   Diverticula of intestine    DJD (degenerative joint disease)    Dysrhythmia    Essential hypertension    Gout    History of kidney stones    OSA (obstructive sleep apnea)    Pt denies, Stop Bang 5   Osteoarthritis    Polio    Type 2 diabetes mellitus (HCC)       PAST SURGICAL HISTORY: Past Surgical History:  Procedure Laterality  Date   removal of toe nail     age 32 for fungus   REVERSE SHOULDER ARTHROPLASTY Left 06/12/2023   Procedure: ARTHROPLASTY, SHOULDER, TOTAL, REVERSE;  Surgeon: Kay Kemps, MD;  Location: [facility];  Service: Orthopedics;  Laterality: Left;   SHOULDER ARTHROSCOPY Right 2011   right   TOTAL HIP ARTHROPLASTY Right 07/27/2021   Procedure: TOTAL HIP ARTHROPLASTY ANTERIOR APPROACH;  Surgeon: Ernie Cough, MD;  Location: [facility];  Service: Orthopedics;  Laterality: Right;   TOTAL KNEE ARTHROPLASTY Left 01/29/2009   Dr. Duwayne    FAMILY HISTORY:  Family History  Problem Relation Age of Onset   Cancer Mother    Heart attack Brother 72    SOCIAL HISTORY:  Social History   Socioeconomic History   Marital status: Married    Spouse name: Not on file   Number of children: Not on file   Years of education: Not on file   Highest education level: 10th grade  Occupational History   Not on file  Tobacco Use   Smoking status: Never   Smokeless tobacco: Never  Vaping Use   Vaping status: Never Used  Substance and Sexual Activity   Alcohol use: Not Currently   Drug use: No   Sexual activity: Not on file  Other Topics Concern   Not on file  Social History Narrative   Retired naval architect   Social Drivers of Corporate Investment Banker Strain: Low Risk  (  04/04/2023)   Overall Financial Resource Strain (CARDIA)    Difficulty of Paying Living Expenses: Not hard at all  Food Insecurity: No Food Insecurity (11/18/2023)   Received from [facility]   Hunger Vital Sign    Within the past 12 months, you worried that your food would run out before you got the money to buy more.: Never true    Within the past 12 months, the food you bought just didn't last and you didn't have money to get more.: Never true  Transportation Needs: No Transportation Needs (11/18/2023)   Received from [facility] - Transportation    Lack of Transportation (Medical): No    Lack of Transportation  (Non-Medical): No  Physical Activity: Insufficiently Active (03/08/2023)   Received from [facility]   Exercise Vital Sign    On average, how many days per week do you engage in moderate to strenuous exercise (like a brisk walk)?: 2 days    On average, how many minutes do you engage in exercise at this level?: 30 min  Stress: No Stress Concern Present (08/14/2023)   Received from [facility] of Occupational Health - Occupational Stress Questionnaire    Do you feel stress - tense, restless, nervous, or anxious, or unable to sleep at night because your mind is troubled all the time - these days?: Not at all  Social Connections: Unknown (06/12/2023)   Social Connection and Isolation Panel    Frequency of Communication with Friends and Family: More than three times a week    Frequency of Social Gatherings with Friends and Family: More than three times a week    Attends Religious Services: Patient declined    Database Administrator or Organizations: Patient declined    Attends Banker Meetings: Patient declined    Marital Status: Married  Catering Manager Violence: Not At Risk (11/18/2023)   Received from [facility]   Humiliation, Afraid, Rape, and Kick questionnaire    Within the last year, have you been afraid of your partner or ex-partner?: No    Within the last year, have you been humiliated or emotionally abused in other ways by your partner or ex-partner?: No    Within the last year, have you been kicked, hit, slapped, or otherwise physically hurt by your partner or ex-partner?: No    Within the last year, have you been raped or forced to have any kind of sexual activity by your partner or ex-partner?: No    ALLERGIES: Enalapril, Penicillins, Cefazolin , and Other  MEDICATIONS:  Current Outpatient Medications  Medication Sig Dispense Refill   amLODipine  (NORVASC ) 10 MG tablet Take 10 mg by mouth daily.     atorvastatin  (LIPITOR) 80 MG tablet  Take 1 tablet (80 mg total) by mouth at bedtime. 90 tablet 3   chlorthalidone  (HYGROTON ) 25 MG tablet Take 25 mg by mouth daily.     colchicine  0.6 MG tablet Take 0.6 mg by mouth daily as needed (gout). Take 2 tablets and one hour later take a third tablet as needed for gout     cyclobenzaprine  (FLEXERIL ) 10 MG tablet Take 10 mg by mouth 3 (three) times daily as needed for muscle spasms.     EPINEPHrine  (EPIPEN  2-PAK) 0.3 mg/0.3 mL IJ SOAJ injection Inject 0.3 mLs (0.3 mg total) into the muscle once. 1 Device 1   furosemide  (LASIX ) 20 MG tablet Take 1 tablet (20 mg total) by  mouth daily. 30 tablet 11   [Paused] glipiZIDE  (GLUCOTROL  XL) 5 MG 24 hr tablet Take 5 mg by mouth daily. (Patient not taking: Reported on 10/22/2023)     JARDIANCE  10 MG TABS tablet TAKE 1 TABLET BY MOUTH DAILY BEFORE BREAKFAST. 30 tablet 6   Krill Oil 1000 MG CAPS Take 1,000 mg by mouth daily.      levofloxacin  (LEVAQUIN ) 750 MG tablet Take 1 tablet (750 mg total) by mouth daily. Take 1 hour prior to your prostate biopsy procedure. 1 tablet 0   losartan  (COZAAR ) 100 MG tablet Take 1 tablet (100 mg total) by mouth daily. 90 tablet 3   Metoprolol  Tartrate 75 MG TABS Take 75 mg by mouth 2 (two) times daily.     Multiple Vitamin (MULTIVITAMIN WITH MINERALS) TABS tablet Take 1 tablet by mouth daily.     ondansetron  (ZOFRAN ) 4 MG tablet Take 1 tablet (4 mg total) by mouth every 8 (eight) hours as needed for nausea, vomiting or refractory nausea / vomiting. 30 tablet 1   oxyCODONE -acetaminophen  (PERCOCET) 5-325 MG tablet Take 1 tablet by mouth every 4 (four) hours as needed for severe pain (pain score 7-10). 40 tablet 0   sildenafil  (VIAGRA ) 100 MG tablet Take 1 tablet by mouth daily as needed.     spironolactone  (ALDACTONE ) 25 MG tablet Take 1 tablet (25 mg total) by mouth daily. 90 tablet 3   warfarin (COUMADIN ) 3 MG tablet TAKE 2 AND 1/2 TABLETS DAILY EXCEPT 3 TABLETS ON MONDAYS OR AS DIRECTED BY COUMADIN  CLINIC 230 tablet 1   No  current facility-administered medications for this encounter.    REVIEW OF SYSTEMS:  On review of systems, the patient reports that he is doing well overall. He denies any chest pain, shortness of breath, cough, fevers, chills, night sweats, unintended weight changes. He denies any bowel disturbances, and denies abdominal pain, nausea or vomiting. He denies any new musculoskeletal or joint aches or pains. His IPSS was ***, indicating *** urinary symptoms. His SHIM was ***, indicating he {does not have/has mild/moderate/severe} erectile dysfunction. A complete review of systems is obtained and is otherwise negative.    PHYSICAL EXAM:  Wt Readings from Last 3 Encounters:  10/22/23 293 lb 9.6 oz (133.2 kg)  06/12/23 270 lb 9.6 oz (122.7 kg)  06/06/23 270 lb 9.6 oz (122.7 kg)   Temp Readings from Last 3 Encounters:  11/11/23 98 F (36.7 C) (Oral)  06/13/23 97.6 F (36.4 C)  06/06/23 98.2 F (36.8 C) (Oral)   BP Readings from Last 3 Encounters:  11/11/23 (!) 149/65  10/22/23 108/68  10/16/23 (!) 144/69   Pulse Readings from Last 3 Encounters:  11/11/23 68  10/22/23 61  10/16/23 66    /10  In general this is a well appearing *** male in no acute distress. He's alert and oriented x4 and appropriate throughout the examination. Cardiopulmonary assessment is negative for acute distress, and he exhibits normal effort.     KPS = ***  100 - Normal; no complaints; no evidence of disease. 90   - Able to carry on normal activity; minor signs or symptoms of disease. 80   - Normal activity with effort; some signs or symptoms of disease. 18   - Cares for self; unable to carry on normal activity or to do active work. 60   - Requires occasional assistance, but is able to care for most of his personal needs. 50   - Requires considerable assistance and frequent medical  care. 40   - Disabled; requires special care and assistance. 30   - Severely disabled; hospital admission is indicated although  death not imminent. 20   - Very sick; hospital admission necessary; active supportive treatment necessary. 10   - Moribund; fatal processes progressing rapidly. 0     - Dead  Karnofsky DA, Abelmann WH, Craver LS and Burchenal [facility] [phone]) The use of the nitrogen mustards in the palliative treatment of carcinoma: with particular reference to bronchogenic carcinoma Cancer 1 634-56  LABORATORY DATA:  Lab Results  Component Value Date   WBC 9.8 05/29/2023   HGB 13.1 05/29/2023   HCT 42.0 05/29/2023   MCV 86.2 05/29/2023   PLT 249 05/29/2023   Lab Results  Component Value Date   NA 137 05/29/2023   K 4.3 05/29/2023   CL 103 05/29/2023   CO2 24 05/29/2023   Lab Results  Component Value Date   ALT 21 04/02/2023   AST 22 04/02/2023   ALKPHOS 60 04/02/2023   BILITOT 1.0 04/02/2023     RADIOGRAPHY: US  Guided Needle Placement Result Date: 11/11/2023 CLINICAL DATA:  Prostate biopsy. EXAM: IR ULTRASOUND GUIDED ASPIRATION/DRAINAGE TECHNIQUE: Ultrasound guidance was provided prostate biopsy. COMPARISON:  None Available. FINDINGS: See above. IMPRESSION: Ultrasound guidance provided for prostate biopsy. Electronically Signed   By: Marcey Moan M.D.   On: 11/11/2023 09:21   US  PROSTATE BIOPSY MULTIPLE Result Date: 11/11/2023 Please see Notes tab for imaging impression.  US  Transrectal Complete Result Date: 11/11/2023 Please see Notes tab for imaging impression.     IMPRESSION/PLAN: 1. 73 y.o. gentleman with Stage T1c adenocarcinoma of the prostate with Gleason Score of 3+4, and PSA of 20.2. We discussed the patient's workup and outlined the nature of prostate cancer in this setting. The patient's T stage, Gleason's score, and PSA put him into the intermediate risk group. Accordingly, he is eligible for a variety of potential treatment options including brachytherapy, 5.5 weeks of external radiation, or prostatectomy. We discussed the available radiation techniques, and focused on the  details and logistics of delivery. We discussed and outlined the risks, benefits, short and long-term effects associated with radiotherapy and compared and contrasted these with prostatectomy. We discussed the role of SpaceOAR gel in reducing the rectal toxicity associated with radiotherapy. He appears to have a good understanding of his disease and our treatment recommendations which are of curative intent.  He was encouraged to ask questions that were answered to his stated satisfaction.  At the conclusion of our conversation, the patient is interested in moving forward with ***.  We personally spent *** minutes in this encounter including chart review, reviewing radiological studies, meeting face-to-face with the patient, entering orders and completing documentation.    Sabra MICAEL Rusk, PA-C    Donnice Barge, MD  [facility]  Radiation Oncology Direct Dial: [phone]  Fax: [phone] .com  Skype  LinkedIn   This document serves as a record of services personally performed by Donnice Barge, MD and Sabra Rusk, PA-C. It was created on their behalf by Izetta Neither, a trained medical scribe. The creation of this record is based on the scribe's personal observations and the provider's statements to them. This document has been checked and approved by the attending provider.

## 2023-12-09 ENCOUNTER — Encounter: Payer: Self-pay | Admitting: Radiation Oncology

## 2023-12-09 ENCOUNTER — Ambulatory Visit
Admission: RE | Admit: 2023-12-09 | Discharge: 2023-12-09 | Disposition: A | Discharge status: Home / Self Care | Source: Ambulatory Visit | Attending: Radiation Oncology | Admitting: Radiation Oncology

## 2023-12-09 VITALS — BP 158/85 | HR 52 | Temp 97.6°F | Resp 24 | Ht 72.0 in | Wt 305.4 lb

## 2023-12-09 DIAGNOSIS — M1990 Unspecified osteoarthritis, unspecified site: Secondary | ICD-10-CM | POA: Diagnosis not present

## 2023-12-09 DIAGNOSIS — G4730 Sleep apnea, unspecified: Secondary | ICD-10-CM | POA: Insufficient documentation

## 2023-12-09 DIAGNOSIS — M109 Gout, unspecified: Secondary | ICD-10-CM | POA: Insufficient documentation

## 2023-12-09 DIAGNOSIS — C61 Malignant neoplasm of prostate: Secondary | ICD-10-CM | POA: Diagnosis present

## 2023-12-09 DIAGNOSIS — Z809 Family history of malignant neoplasm, unspecified: Secondary | ICD-10-CM | POA: Insufficient documentation

## 2023-12-09 DIAGNOSIS — Z7984 Long term (current) use of oral hypoglycemic drugs: Secondary | ICD-10-CM | POA: Diagnosis not present

## 2023-12-09 DIAGNOSIS — Z87442 Personal history of urinary calculi: Secondary | ICD-10-CM | POA: Insufficient documentation

## 2023-12-09 DIAGNOSIS — Z7901 Long term (current) use of anticoagulants: Secondary | ICD-10-CM | POA: Diagnosis not present

## 2023-12-09 DIAGNOSIS — Z79899 Other long term (current) drug therapy: Secondary | ICD-10-CM | POA: Insufficient documentation

## 2023-12-09 DIAGNOSIS — I4891 Unspecified atrial fibrillation: Secondary | ICD-10-CM | POA: Insufficient documentation

## 2023-12-09 DIAGNOSIS — E119 Type 2 diabetes mellitus without complications: Secondary | ICD-10-CM | POA: Diagnosis not present

## 2023-12-09 DIAGNOSIS — I10 Essential (primary) hypertension: Secondary | ICD-10-CM | POA: Insufficient documentation

## 2023-12-09 NOTE — Progress Notes (Signed)
 Introduced myself to the patient, and his wife, as the prostate nurse navigator. He is here to discuss his radiation treatment options and will proceed with ADT and 8 weeks of IMRT in [place]. I provided patient with some written education and my direct contact information.  Patient knows to reach out with any questions or barriers that may arise.

## 2023-12-10 ENCOUNTER — Other Ambulatory Visit: Payer: Self-pay | Admitting: Urology

## 2023-12-10 ENCOUNTER — Telehealth: Payer: Self-pay

## 2023-12-10 DIAGNOSIS — C61 Malignant neoplasm of prostate: Secondary | ICD-10-CM

## 2023-12-11 ENCOUNTER — Other Ambulatory Visit: Payer: Self-pay

## 2023-12-11 DIAGNOSIS — C61 Malignant neoplasm of prostate: Secondary | ICD-10-CM

## 2023-12-11 MED ORDER — FLEET ENEMA RE ENEM: 1.0000 | ENEMA | Freq: Once | RECTAL | 0 refills | Status: AC

## 2023-12-12 ENCOUNTER — Telehealth (HOSPITAL_BASED_OUTPATIENT_CLINIC_OR_DEPARTMENT_OTHER): Payer: Self-pay

## 2023-12-12 NOTE — Telephone Encounter (Signed)
 Miguel Cox,  Miguel Cox is requesting preoperative cardiac evaluation for fiducial markers and SpaceOAR gel procedure.  Procedure is scheduled for 01/13/2024.  He was seen by you recently in clinic on 10/22/2023.  Would you be able to provide recommendations on cardiac risk for planned procedure?    Thank you for your help  Josefa HERO. Suzie Vandam NP-C     12/12/2023, 3:13 PM [facility] [address] [place], [place] [address] Office [phone]

## 2023-12-12 NOTE — Telephone Encounter (Signed)
   Pre-operative Risk Assessment    Patient Name: DEWAINE MOROCHO  DOB: 01/28/1951 MRN: [id number]   Date of last office visit: 10/22/23 with Miriam  Date of next office visit: 04/23/24 with Dr. Mallipeddi   Request for Surgical Clearance    Procedure:  fiducial markers and spaceoar gel procedure   Date of Surgery:  Clearance 01/13/24                                 Surgeon:  Dr. Sherrilee Surgeon's Group or Practice Name:  [facility] [place] Phone number:  [phone] Fax number:  [phone]   Type of Clearance Requested:   - Medical  - Pharmacy:  Hold Warfarin (Coumadin ) for 5 days    Type of Anesthesia:  General    Additional requests/questions:    Bonney Augustin JONETTA Delores   12/12/2023, 7:52 AM

## 2023-12-13 ENCOUNTER — Other Ambulatory Visit: Payer: Self-pay

## 2023-12-13 DIAGNOSIS — C61 Malignant neoplasm of prostate: Secondary | ICD-10-CM

## 2023-12-13 MED ORDER — LEUPROLIDE ACETATE (6 MONTH) 45 MG ~~LOC~~ KIT
45.0000 mg | PACK | SUBCUTANEOUS | Status: AC
  Administered 2023-12-24: 45 mg via SUBCUTANEOUS

## 2023-12-13 NOTE — Progress Notes (Signed)
 RN reached out to urology to see if any availability to have patient's ADT appointment sooner than 01/06/24.  Pending review.

## 2023-12-16 NOTE — Telephone Encounter (Signed)
 [name] perioperative risk of a major cardiac event is 0.4% according to the Revised Cardiac Risk Index (RCRI).  Therefore, he is at low risk for perioperative complications.   His functional capacity is excellent at 6.61 METs according to the Duke Activity Status Index (DASI). Recommendations: According to ACC/AHA guidelines, no further cardiovascular testing needed.  The patient may proceed to surgery at acceptable risk.   Antiplatelet and/or Anticoagulation Recommendations: Pharm D needs to comment on how long to hold Coumadin .   Miguel Crate, NP

## 2023-12-16 NOTE — Telephone Encounter (Signed)
 Per Dr. Sherrilee patient will start with Eligard 45

## 2023-12-18 NOTE — Telephone Encounter (Signed)
 Patient with diagnosis of afib on warfarin for anticoagulation.    Procedure: fiducial markers and spaceoar gel procedure  Date of procedure: 01/13/24   CHA2DS2-VASc Score = 5   This indicates a 7.2% annual risk of stroke. The patient's score is based upon: CHF History: 1 HTN History: 1 Diabetes History: 1 Stroke History: 0 Vascular Disease History: 1 Age Score: 1 Gender Score: 0       Patient has not had an Afib/aflutter ablation in the last 3 months, DCCV within the last 4 weeks or a watchman implanted in the last 45 days   Per office protocol, patient can hold warfarin for 5 days prior to procedure.   Patient will not need bridging with Lovenox (enoxaparin) around procedure.  **This guidance is not considered finalized until pre-operative APP has relayed final recommendations.**

## 2023-12-18 NOTE — Telephone Encounter (Signed)
 Pharmacy please advise on holding coumadin  for 5 days prior to  fiducial markers and spaceoar gel procedure  scheduled for 01/13/2024. Last labs (CMET-10/22/2023) and (CBC 08/23/2023). Thank you.

## 2023-12-19 NOTE — Telephone Encounter (Signed)
   Patient Name: Miguel Cox  DOB: 02/29/1950 MRN: [id number]  Primary Cardiologist: Diannah SHAUNNA Maywood, MD  Chart reviewed as part of pre-operative protocol coverage. Pre-op clearance already addressed by colleagues in earlier phone notes. To summarize recommendations:  - Miguel Cox's perioperative risk of a major cardiac event is 0.4% according to the Revised Cardiac Risk Index (RCRI).  Therefore, he is at low risk for perioperative complications.   His functional capacity is excellent at 6.61 METs according to the Duke Activity Status Index (DASI). Recommendations: According to ACC/AHA guidelines, no further cardiovascular testing needed.  The patient may proceed to surgery at acceptable risk.   Antiplatelet and/or Anticoagulation Recommendations: Pharm D needs to comment on how long to hold Coumadin .    Almarie Crate, NP   Patient has not had an Afib/aflutter ablation in the last 3 months, DCCV within the last 4 weeks or a watchman implanted in the last 45 days    Per office protocol, patient can hold warfarin for 5 days prior to procedure.   Patient will not need bridging with Lovenox (enoxaparin) around procedure.   Will route this bundled recommendation to requesting provider via Epic fax function and remove from pre-op pool. Please call with questions.  Orren LOISE Fabry, PA-C 12/19/2023, 8:13 AM

## 2023-12-19 NOTE — Telephone Encounter (Signed)
 [name] perioperative risk of a major cardiac event is 0.4% according to the Revised Cardiac Risk Index (RCRI).  Therefore, he is at low risk for perioperative complications.   His functional capacity is excellent at 6.61 METs according to the Duke Activity Status Index (DASI). Recommendations: According to ACC/AHA guidelines, no further cardiovascular testing needed.  The patient may proceed to surgery at acceptable risk.   Antiplatelet and/or Anticoagulation Recommendations: Per office protocol, pt can hold warfarin for 5 days prior to procedure. He will not need bridging with Lovenox around procedure.    Miguel Crate, NP

## 2023-12-24 ENCOUNTER — Ambulatory Visit (INDEPENDENT_AMBULATORY_CARE_PROVIDER_SITE_OTHER)

## 2023-12-24 DIAGNOSIS — C61 Malignant neoplasm of prostate: Secondary | ICD-10-CM | POA: Diagnosis not present

## 2023-12-24 NOTE — Progress Notes (Addendum)
 Eligard  SubQ Injection   Due to Prostate Cancer patient is present today for a Eligard  Injection. Order reviewed. Prior Authorization reviewed.  Medication: Eligard  6 month Dose: 45 mg  Location: right upper abdomen  site prepped and cleaned with alcohol prior to giving injection.  Lot: [id number] Exp: 01/2025  Patient tolerated well, no complications were noted  Performed by: Carlos, CMA

## 2023-12-25 ENCOUNTER — Ambulatory Visit: Attending: Internal Medicine | Admitting: *Deleted

## 2023-12-25 DIAGNOSIS — Z5181 Encounter for therapeutic drug level monitoring: Secondary | ICD-10-CM | POA: Diagnosis present

## 2023-12-25 DIAGNOSIS — I4819 Other persistent atrial fibrillation: Secondary | ICD-10-CM | POA: Diagnosis present

## 2023-12-25 LAB — POCT INR: INR: 3.4 — AB (ref 2.0–3.0)

## 2023-12-25 NOTE — Progress Notes (Signed)
 INR 3.4  Please see anticoagulation encounter

## 2023-12-25 NOTE — Patient Instructions (Signed)
 Hold warfarin tonight then resume 2 1/2 tablets daily except 2 tablets on Mondays and Thursdays. Pending prostate seed implants on 01/13/24 Will hold warfarin 5 days before procedure ans resume night of procedure if OK with Surgeon Be consistent with Vit K foods Recheck  1 wk after procedure

## 2023-12-27 NOTE — Progress Notes (Signed)
 Patient has received his ADT with Dr. Little office on 11/25, Eligard  45mg .   Patient has also met with Dr. Dannielle and will be scheduled for 8 weeks of IMRT.   Patient is established with a treatment plan and is actively engaged in care. Nurse Navigator services not currently indicated at this time. Will re-evaluate if needs change or if additional support is requested.

## 2024-01-06 ENCOUNTER — Ambulatory Visit: Admitting: Urology

## 2024-01-06 NOTE — Patient Instructions (Signed)
 Miguel Cox  01/06/2024     @PREFPERIOPPHARMACY @   Your procedure is scheduled on 01/13/2024.   Report to [facility] at 12:00 PM.   Call this number if you have problems the morning of surgery:   [phone]  If you experience any cold or flu symptoms such as cough, fever, chills, shortness of breath, etc. between now and your scheduled surgery, please notify us  at the above number.   Remember:   Do not eat  after midnight.   You may drink clear liquids until 8:00 AM .  Clear liquids allowed are:                    Water , Carbonated beverages (diabetics please choose diet or no sugar options), and Clear Sports drink (No red color; diabetics please choose diet or no sugar options)    Take these medicines the morning of surgery with A SIP OF WATER  :  Norvasc  Colchicine  and Metoprolol    Jardiance  Last dose 01/09/2024.SABRASABRANo diabetic meds the am of the procedure.    Coumadin  last dose 01/07/2024    Do not wear jewelry, make-up or nail polish, including gel polish,  artificial nails, or any other type of covering on natural nails (fingers and  toes).  Do not wear lotions, powders, or perfumes, or deodorant.  Do not shave 48 hours prior to surgery.  Men may shave face and neck.  Do not bring valuables to the hospital.  [facility] is not responsible for any belongings or valuables.  Contacts, dentures or bridgework may not be worn into surgery.  Leave your suitcase in the car.  After surgery it may be brought to your room.  For patients admitted to the hospital, discharge time will be determined by your treatment team.  Patients discharged the day of surgery will not be allowed to drive home.   Name and phone number of your driver:   Family  Special instructions:  N/A  Please read over the following fact sheets that you were given.  Care and Recovery After Surgery  General Anesthesia, Adult General anesthesia is the use of medicine to make you fall asleep  (unconscious) for a medical procedure. General anesthesia must be used for certain procedures. It is often recommended for surgery or procedures that: Last a long time. Require you to be still or in an unusual position. Are major and can cause blood loss. Affect your breathing. The medicines used for general anesthesia are called general anesthetics. During general anesthesia, these medicines are given along with medicines that: Prevent pain. Control your blood pressure. Relax your muscles. Prevent nausea and vomiting after the procedure. Tell a health care provider about: Any allergies you have. All medicines you are taking, including vitamins, herbs, eye drops, creams, and over-the-counter medicines. Your history of any: Medical conditions you have, including: High blood pressure. Bleeding problems. Diabetes. Heart or lung conditions, such as: Heart failure. Sleep apnea. Asthma. Chronic obstructive pulmonary disease (COPD). Current or recent illnesses, such as: Upper respiratory, chest, or ear infections. Cough or fever. Tobacco or drug use, including marijuana or alcohol use. Depression or anxiety. Surgeries and types of anesthetics you have had. Problems you or family members have had with anesthetic medicines. Whether you are pregnant or may be pregnant. Whether you have any chipped or loose teeth, dentures, caps, bridgework, or issues with your mouth, swallowing, or choking. What are the risks? Your health care provider will talk with you about risks. These may  include: Allergic reaction to the medicines. Lung and heart problems. Inhaling food or liquid from the stomach into the lungs (aspiration). Nerve injury. Injury to the lips, mouth, teeth, or gums. Stroke. Waking up during your procedure and being unable to move. This is rare. These problems are more likely to develop if you are having a major surgery or if you have an advanced or serious medical condition. You  can prevent some of these complications by answering all of your health care provider's questions thoroughly and by following all instructions before your procedure. General anesthesia can cause side effects, including: Nausea or vomiting. A sore throat or hoarseness from the breathing tube. Wheezing or coughing. Shaking chills or feeling cold. Body aches. Sleepiness. Confusion, agitation (delirium), or anxiety. What happens before the procedure? When to stop eating and drinking Follow instructions from your health care provider about what you may eat and drink before your procedure. If you do not follow your health care provider's instructions, your procedure may be delayed or canceled. Medicines Ask your health care provider about: Changing or stopping your regular medicines. These include any diabetes medicines or blood thinners you take. Taking medicines such as aspirin  and ibuprofen. These medicines can thin your blood. Do not take them unless your health care provider tells you to. Taking over-the-counter medicines, vitamins, herbs, and supplements. General instructions Do not use any products that contain nicotine or tobacco for at least 4 weeks before the procedure. These products include cigarettes, chewing tobacco, and vaping devices, such as e-cigarettes. If you need help quitting, ask your health care provider. If you brush your teeth on the morning of the procedure, make sure to spit out all of the water  and toothpaste. If told by your health care provider, bring your sleep apnea device with you to surgery (if applicable). If you will be going home right after the procedure, plan to have a responsible adult: Take you home from the hospital or clinic. You will not be allowed to drive. Care for you for the time you are told. What happens during the procedure?  An IV will be inserted into one of your veins. You will be given one or more of the following through a face mask or  IV: A sedative. This helps you relax. Anesthesia. This will: Numb certain areas of your body. Make you fall asleep for surgery. After you are unconscious, a breathing tube may be inserted down your throat to help you breathe. This will be removed before you wake up. An anesthesia provider, such as an anesthesiologist, will stay with you throughout your procedure. The anesthesia provider will: Keep you comfortable and safe by continuing to give you medicines and adjusting the amount of medicine that you get. Monitor your blood pressure, heart rate, and oxygen levels to make sure that the anesthetics do not cause any problems. The procedure may vary among health care providers and hospitals. What happens after the procedure? Your blood pressure, temperature, heart rate, breathing rate, and blood oxygen level will be monitored until you leave the hospital or clinic. You will wake up in a recovery area. You may wake up slowly. You may be given medicine to help you with pain, nausea, or any other side effects from the anesthesia. Summary General anesthesia is the use of medicine to make you fall asleep (unconscious) for a medical procedure. Follow your health care provider's instructions about when to stop eating, drinking, or taking certain medicines before your procedure. Plan to have a responsible  adult take you home from the hospital or clinic. This information is not intended to replace advice given to you by your health care provider. Make sure you discuss any questions you have with your health care provider. Document Revised: 04/13/2021 Document Reviewed: 04/13/2021 Elsevier Patient Education  2024 [employer]  Transrectal Ultrasound-Guided Prostate Gold Seed Placement Transrectal ultrasound-guided prostate gold seed placement is a procedure used to place small metal seeds (fiducial markers)in or around a tumor in the prostate. These seeds help show exactly where a tumor is located. This  helps aim radiation therapy right at the prostate tumor, which helps limit damage to nearby healthy tissue. During this procedure, a small device (probe) is lubricated and put inside the rectum. The probe sends out sound waves that make a picture of the prostate (transrectal ultrasound). The images are used to help guide the placement of the gold seeds. This procedure is also called fiducial marker placement. Tell a health care provider about: Any allergies you have. All medicines you are taking, including vitamins, herbs, eye drops, creams, and over-the-counter medicines. Any problems you or family members have had with anesthetic medicines. Any bleeding problems you have. Any surgeries you have had. Any medical conditions you have. Any prostate infections you have had. What are the risks? Generally, this is a safe procedure. However, problems may occur, including: Infection. Bleeding. Allergic reactions to medicines or dyes. Damage to nearby structures or organs. The gold seeds moving to another part of the body. This is rare. What happens before the procedure? Staying hydrated Follow instructions from your health care provider about hydration, which may include: Up to 2 hours before the procedure - you may continue to drink clear liquids, such as water , clear fruit juice, black coffee, and plain tea. Eating and drinking restrictions Follow instructions from your health care provider about eating and drinking, which may include: 8 hours before the procedure - stop eating heavy meals or foods, such as meat, fried foods, or fatty foods. 6 hours before the procedure - stop eating light meals or foods, such as toast or cereal. 6 hours before the procedure - stop drinking milk or drinks that contain milk. 2 hours before the procedure - stop drinking clear liquids. Medicines Ask your health care provider about: Changing or stopping your regular medicines. This is especially important if you  are taking diabetes medicines or blood thinners. Taking medicines such as aspirin  and ibuprofen. These medicines can thin your blood. Do not take these medicines unless your health care provider tells you to take them. Taking over-the-counter medicines, vitamins, herbs, and supplements. Follow your health care provider's instructions about cleaning out your bowels. Surgery safety Ask your health care provider: How the seeds will be put into your prostate. The seeds may be put in: Through your rectum (transrectal placement). Through your perineum, which is the skin behind your scrotum (transperineal approach). What steps will be taken to help prevent infection. These steps may include: Removing hair at the procedure site. Washing skin with a germ-killing soap. Taking antibiotics before and after the procedure. General instructions You may have an exam or testing. You may have blood samples taken. Plan to have a responsible adult take you home from the hospital or clinic. Plan to have a responsible adult care for you for the time you are told after you leave the hospital or clinic. What happens during the procedure?  An IV will be put into one of the veins in your hand or arm. You  may be given: A medicine to help you relax (sedative). A medicine to numb the area (local anesthetic). A medicine that is injected into an area of your body to numb everything below the injection site (regional anesthetic). You will be placed on your left side with your knees bent up toward your chest. A lubricated probe will be put into your rectum to do the ultrasound. Using the ultrasound as a guide, the health care provider will insert a needle into your prostate through either the perineum or the rectum and then will place the tip of the needle near the tumor. The needle will be used to inject the gold seed into the area around the tumor. This will be repeated with more gold seeds. The number of seeds used  depends on the size and location of the tumor. The needle and probe will be removed. The procedure may vary among health care providers and hospitals. What happens after the procedure? Your blood pressure, heart rate, breathing rate, and blood oxygen level will be monitored until you leave the hospital or clinic. You may have imaging tests done to check the placement of the gold seeds. This may include a CT scan or ultrasound. If you were given a sedative during the procedure, it can affect you for several hours. Do not drive or operate machinery until your health care provider says that it is safe. You may be given medicine to help with pain, if needed. Summary Transrectal ultrasound-guided prostate gold seed placement is a procedure to place small metal seeds (fiducial markers)in or near a tumor in the prostate. The gold seeds are used to aim radiation therapy directly at the prostate tumor, which helps limit damage to nearby healthy tissue. During this procedure, a small probe will be placed in the rectum to take images of the prostate (transrectal ultrasound). The images will help guide the placement of the gold seeds. This information is not intended to replace advice given to you by your health care provider. Make sure you discuss any questions you have with your health care provider. Document Revised: 04/13/2020 Document Reviewed: 04/13/2020 Elsevier Patient Education  2024 [employer].

## 2024-01-08 ENCOUNTER — Inpatient Hospital Stay (HOSPITAL_COMMUNITY): Admission: RE | Admit: 2024-01-08 | Discharge: 2024-01-08 | Attending: Urology

## 2024-01-08 ENCOUNTER — Encounter (HOSPITAL_COMMUNITY): Payer: Self-pay

## 2024-01-08 VITALS — BP 162/72 | HR 54 | Temp 97.9°F | Ht 72.0 in | Wt 300.0 lb

## 2024-01-08 DIAGNOSIS — E1100 Type 2 diabetes mellitus with hyperosmolarity without nonketotic hyperglycemic-hyperosmolar coma (NKHHC): Secondary | ICD-10-CM | POA: Insufficient documentation

## 2024-01-08 DIAGNOSIS — Z01818 Encounter for other preprocedural examination: Secondary | ICD-10-CM | POA: Diagnosis present

## 2024-01-08 DIAGNOSIS — I5021 Acute systolic (congestive) heart failure: Secondary | ICD-10-CM | POA: Insufficient documentation

## 2024-01-08 LAB — BASIC METABOLIC PANEL WITH GFR
Anion gap: 17 — ABNORMAL HIGH (ref 5–15)
BUN: 29 mg/dL — ABNORMAL HIGH (ref 8–23)
CO2: 22 mmol/L (ref 22–32)
Calcium: 9.3 mg/dL (ref 8.9–10.3)
Chloride: 100 mmol/L (ref 98–111)
Creatinine, Ser: 1.52 mg/dL — ABNORMAL HIGH (ref 0.61–1.24)
GFR, Estimated: 48 mL/min — ABNORMAL LOW (ref >60)
Glucose, Bld: 255 mg/dL — ABNORMAL HIGH (ref 70–99)
Potassium: 4 mmol/L (ref 3.5–5.1)
Sodium: 139 mmol/L (ref 135–145)

## 2024-01-13 ENCOUNTER — Encounter (HOSPITAL_COMMUNITY): Payer: Self-pay | Admitting: Urology

## 2024-01-13 ENCOUNTER — Ambulatory Visit (HOSPITAL_COMMUNITY): Payer: Self-pay

## 2024-01-13 ENCOUNTER — Other Ambulatory Visit: Payer: Self-pay | Admitting: Urology

## 2024-01-13 ENCOUNTER — Encounter (HOSPITAL_COMMUNITY): Admission: RE | Disposition: A | Payer: Self-pay | Source: Home / Self Care | Attending: Urology

## 2024-01-13 ENCOUNTER — Encounter (HOSPITAL_COMMUNITY): Payer: Self-pay

## 2024-01-13 ENCOUNTER — Ambulatory Visit (HOSPITAL_COMMUNITY)
Admission: RE | Admit: 2024-01-13 | Discharge: 2024-01-13 | Disposition: A | Discharge status: Home / Self Care | Source: Ambulatory Visit | Attending: Urology | Admitting: Urology

## 2024-01-13 ENCOUNTER — Ambulatory Visit (HOSPITAL_COMMUNITY)
Admission: RE | Admit: 2024-01-13 | Discharge: 2024-01-13 | Disposition: A | Source: Home / Self Care | Attending: Urology | Admitting: Urology

## 2024-01-13 ENCOUNTER — Ambulatory Visit (HOSPITAL_COMMUNITY)

## 2024-01-13 ENCOUNTER — Other Ambulatory Visit: Payer: Self-pay

## 2024-01-13 DIAGNOSIS — C61 Malignant neoplasm of prostate: Secondary | ICD-10-CM

## 2024-01-13 DIAGNOSIS — I4891 Unspecified atrial fibrillation: Secondary | ICD-10-CM | POA: Insufficient documentation

## 2024-01-13 DIAGNOSIS — M1990 Unspecified osteoarthritis, unspecified site: Secondary | ICD-10-CM | POA: Diagnosis not present

## 2024-01-13 DIAGNOSIS — Z7984 Long term (current) use of oral hypoglycemic drugs: Secondary | ICD-10-CM | POA: Diagnosis not present

## 2024-01-13 DIAGNOSIS — Z7901 Long term (current) use of anticoagulants: Secondary | ICD-10-CM | POA: Insufficient documentation

## 2024-01-13 DIAGNOSIS — I110 Hypertensive heart disease with heart failure: Secondary | ICD-10-CM | POA: Diagnosis not present

## 2024-01-13 DIAGNOSIS — I509 Heart failure, unspecified: Secondary | ICD-10-CM | POA: Insufficient documentation

## 2024-01-13 DIAGNOSIS — D649 Anemia, unspecified: Secondary | ICD-10-CM | POA: Insufficient documentation

## 2024-01-13 DIAGNOSIS — E6689 Other obesity not elsewhere classified: Secondary | ICD-10-CM | POA: Insufficient documentation

## 2024-01-13 DIAGNOSIS — G4733 Obstructive sleep apnea (adult) (pediatric): Secondary | ICD-10-CM | POA: Insufficient documentation

## 2024-01-13 DIAGNOSIS — E119 Type 2 diabetes mellitus without complications: Secondary | ICD-10-CM | POA: Diagnosis not present

## 2024-01-13 DIAGNOSIS — Z6841 Body Mass Index (BMI) 40.0 and over, adult: Secondary | ICD-10-CM | POA: Diagnosis not present

## 2024-01-13 HISTORY — PX: SPACE OAR INSTILLATION: SHX6769

## 2024-01-13 HISTORY — PX: GOLD SEED IMPLANT: SHX6343

## 2024-01-13 LAB — GLUCOSE, CAPILLARY
Glucose-Capillary: 172 mg/dL — ABNORMAL HIGH (ref 70–99)
Glucose-Capillary: 167 mg/dL — ABNORMAL HIGH (ref 70–99)

## 2024-01-13 SURGERY — INSERTION, GOLD SEEDS
Anesthesia: General | Site: Prostate

## 2024-01-13 MED ORDER — CHLORHEXIDINE GLUCONATE 0.12 % MT SOLN
15.0000 mL | Freq: Once | OROMUCOSAL | Status: AC
  Administered 2024-01-13: 11:00:00 15 mL via OROMUCOSAL

## 2024-01-13 MED ORDER — ONDANSETRON HCL 4 MG/2ML IJ SOLN: 4.0000 mg | Freq: Once | INTRAMUSCULAR | Status: DC | PRN

## 2024-01-13 MED ORDER — ORAL CARE MOUTH RINSE: 15.0000 mL | Freq: Once | OROMUCOSAL | Status: DC

## 2024-01-13 MED ORDER — OXYCODONE HCL 5 MG/5ML PO SOLN: 5.0000 mg | Freq: Once | ORAL | Status: DC | PRN

## 2024-01-13 MED ORDER — ORAL CARE MOUTH RINSE: 15.0000 mL | Freq: Once | OROMUCOSAL | Status: AC

## 2024-01-13 MED ORDER — GLYCOPYRROLATE PF 0.2 MG/ML IJ SOSY
PREFILLED_SYRINGE | INTRAMUSCULAR | Status: DC | PRN
  Administered 2024-01-13: 13:00:00 .2 mg via INTRAVENOUS

## 2024-01-13 MED ORDER — LIDOCAINE 2% (20 MG/ML) 5 ML SYRINGE
INTRAMUSCULAR | Status: DC | PRN
  Administered 2024-01-13: 13:00:00 100 mg via INTRAVENOUS

## 2024-01-13 MED ORDER — GENTAMICIN SULFATE 40 MG/ML IJ SOLN
5.0000 mg/kg | INTRAVENOUS | Status: AC
  Administered 2024-01-13: 13:00:00 510 mg via INTRAVENOUS
  Filled 2024-01-13: qty 12.75

## 2024-01-13 MED ORDER — TRAMADOL HCL 50 MG PO TABS: 50.0000 mg | ORAL_TABLET | Freq: Four times a day (QID) | ORAL | 0 refills | Status: AC | PRN

## 2024-01-13 MED ORDER — FENTANYL CITRATE (PF) 100 MCG/2ML IJ SOLN
INTRAMUSCULAR | Status: AC
  Filled 2024-01-13: qty 2

## 2024-01-13 MED ORDER — FENTANYL CITRATE (PF) 50 MCG/ML IJ SOSY: 25.0000 ug | PREFILLED_SYRINGE | INTRAMUSCULAR | Status: DC | PRN

## 2024-01-13 MED ORDER — OXYCODONE HCL 5 MG PO TABS: 5.0000 mg | ORAL_TABLET | Freq: Once | ORAL | Status: DC | PRN

## 2024-01-13 MED ORDER — EPHEDRINE 5 MG/ML INJ
INTRAVENOUS | Status: AC
  Filled 2024-01-13: qty 5

## 2024-01-13 MED ORDER — CHLORHEXIDINE GLUCONATE 0.12 % MT SOLN
15.0000 mL | Freq: Once | OROMUCOSAL | Status: DC
  Filled 2024-01-13: qty 15

## 2024-01-13 MED ORDER — PROPOFOL 500 MG/50ML IV EMUL
INTRAVENOUS | Status: DC | PRN
  Administered 2024-01-13: 13:00:00 200 mg via INTRAVENOUS
  Administered 2024-01-13: 13:00:00 30 mg via INTRAVENOUS

## 2024-01-13 MED ORDER — SODIUM CHLORIDE (PF) 0.9 % IJ SOLN
INTRAMUSCULAR | Status: AC
  Filled 2024-01-13: qty 10

## 2024-01-13 MED ORDER — ATROPINE SULFATE (PF) 0.4 MG/ML IJ SOLN
INTRAMUSCULAR | Status: AC
  Filled 2024-01-13: qty 1

## 2024-01-13 MED ORDER — 0.9 % SODIUM CHLORIDE (POUR BTL) OPTIME
TOPICAL | Status: DC | PRN
  Administered 2024-01-13: 13:00:00 1000 mL

## 2024-01-13 MED ORDER — ATROPINE SULFATE 0.4 MG/ML IV SOLN
INTRAVENOUS | Status: DC | PRN
  Administered 2024-01-13 (×2): .2 mg via INTRAVENOUS

## 2024-01-13 MED ORDER — DEXAMETHASONE SOD PHOSPHATE PF 10 MG/ML IJ SOLN
INTRAMUSCULAR | Status: DC | PRN
  Administered 2024-01-13: 13:00:00 5 mg via INTRAVENOUS

## 2024-01-13 MED ORDER — SODIUM CHLORIDE (PF) 0.9 % IJ SOLN
INTRAMUSCULAR | Status: DC | PRN
  Administered 2024-01-13: 13:00:00 10 mL

## 2024-01-13 MED ORDER — LACTATED RINGERS IV SOLN: INTRAVENOUS | Status: DC

## 2024-01-13 MED ORDER — ONDANSETRON HCL 4 MG/2ML IJ SOLN
INTRAMUSCULAR | Status: DC | PRN
  Administered 2024-01-13: 13:00:00 4 mg via INTRAVENOUS

## 2024-01-13 MED ORDER — LIDOCAINE 2% (20 MG/ML) 5 ML SYRINGE
INTRAMUSCULAR | Status: AC
  Filled 2024-01-13: qty 5

## 2024-01-13 MED ORDER — GLYCOPYRROLATE PF 0.2 MG/ML IJ SOSY
PREFILLED_SYRINGE | INTRAMUSCULAR | Status: AC
  Filled 2024-01-13: qty 1

## 2024-01-13 MED ORDER — ONDANSETRON HCL 4 MG/2ML IJ SOLN
INTRAMUSCULAR | Status: AC
  Filled 2024-01-13: qty 2

## 2024-01-13 MED ORDER — FENTANYL CITRATE (PF) 100 MCG/2ML IJ SOLN
INTRAMUSCULAR | Status: DC | PRN
  Administered 2024-01-13: 13:00:00 100 ug via INTRAVENOUS

## 2024-01-13 SURGICAL SUPPLY — 24 items
COVER BACK TABLE 60X90IN (DRAPES) ×1 IMPLANT
COVER TABLE BACK 60X90 (DRAPES) ×1 IMPLANT
DRAPE EENT ADH APERT 31X51 STR (DRAPES) ×1 IMPLANT
DRAPE LEGGINS SURG 28X43 STRL (DRAPES) ×1 IMPLANT
DRAPE U-SHAPE 47X51 STRL (DRAPES) ×1 IMPLANT
DRSG TEGADERM 4X10 (GAUZE/BANDAGES/DRESSINGS) ×1 IMPLANT
GAUZE SPONGE 4X4 12PLY STRL (GAUZE/BANDAGES/DRESSINGS) ×2 IMPLANT
GLOVE BIO SURGEON STRL SZ8 (GLOVE) ×1 IMPLANT
GLOVE BIOGEL PI IND STRL 7.0 (GLOVE) ×2 IMPLANT
GLOVE BIOGEL PI IND STRL 8 (GLOVE) ×1 IMPLANT
GOWN STRL REUS W/TWL LRG LVL3 (GOWN DISPOSABLE) ×1 IMPLANT
IMPL SPACEOAR SYSTEM 10ML (Spacer) ×1 IMPLANT
KIT TURNOVER CYSTO (KITS) ×1 IMPLANT
MARKER GOLD PRELOAD 1.2X3 (Urological Implant) ×1 IMPLANT
MARKER SKIN DUAL TIP RULER LAB (MISCELLANEOUS) ×1 IMPLANT
PAD ARMBOARD POSITIONER FOAM (MISCELLANEOUS) ×2 IMPLANT
POSITIONER HEAD 8X9X4 ADT (SOFTGOODS) ×1 IMPLANT
SOL PREP POV-IOD 4OZ 10% (MISCELLANEOUS) ×1 IMPLANT
SOLN 0.9% NACL POUR BTL 1000ML (IV SOLUTION) ×1 IMPLANT
SYR 10ML LL (SYRINGE) ×1 IMPLANT
SYR CONTROL 10ML LL (SYRINGE) ×1 IMPLANT
TOWEL OR 17X26 4PK STRL BLUE (TOWEL DISPOSABLE) ×1 IMPLANT
UNDERPAD 30X36 HEAVY ABSORB (UNDERPADS AND DIAPERS) ×1 IMPLANT
WATER STERILE IRR 500ML POUR (IV SOLUTION) ×1 IMPLANT

## 2024-01-13 NOTE — Op Note (Signed)
 PRE-OPERATIVE DIAGNOSIS:  Adenocarcinoma of the prostate  POST-OPERATIVE DIAGNOSIS:  Same  PROCEDURE: 1. Prostate Ultrasound 2. Placement of fiducial marks 3. Placement of SpaceOAR  SURGEON:  Surgeon(s): Belvie Clara, MD  ANESTHESIA:  General  EBL:  Minimal  DRAINS: none  FINDINGS: 30.2cc prostate on prostate US   INDICATION: [name] is a 73 year old with a history of T1c prostate cancer who is scheduled to undergo IMRT. He wishes to have fiducial markers and SpaceOAR placed prior to IMRT to decrease rectal toxicity.  Description of procedure: After informed consent the patient was brought to the major OR, placed on the table and administered general anesthesia. He was then moved to the modified lithotomy position with his perineum perpendicular to the floor. His perineum and genitalia were then sterilely prepped. An official timeout was then performed. The transrectal ultrasound probe was placed in the rectum and affixed to the stand. He was then sterilely draped.  A transrectal ultrasound of the prostate was performed.  Lidocaine   was not  instilled using ultrasound guidance into the junction of each seminal vesicle of the prostate.  3 Gold markers were placed into the prostate using the standard template and ultrasound guidance.  Accurate placement of the markers was confirmed.  We then proceeded to mix the SpaceOAR using the kit supplied from the manufacturer. Once this was complete we placed a sinal needle into the perirectal fat between the rectum and the prostate. Once this was accomplished we injected 2cc of normal saline to hydrodissect the plain. We then instilled the the SpaceOAR through the spinal needle and noted good distribution in the perirectal fat.   The patient was awakened and taken to recovery room in stable and satisfactory condition. He tolerated procedure well and there were no intraoperative complications.  CONDITION: Stable, extubated, transferred to  PACU  PLAN: The patient is to be discharged home and he will start IMRT in the next 2-3 weeks

## 2024-01-13 NOTE — Anesthesia Preprocedure Evaluation (Signed)
 Anesthesia Evaluation  Patient identified by MRN, date of birth, ID band Patient awake    Reviewed: Allergy & Precautions, H&P , NPO status , Patient's Chart, lab work & pertinent test results, reviewed documented beta blocker date and time   Airway Mallampati: II  TM Distance: >3 FB Neck ROM: full    Dental no notable dental hx.    Pulmonary sleep apnea    Pulmonary exam normal breath sounds clear to auscultation       Cardiovascular Exercise Tolerance: Good hypertension, +CHF  + dysrhythmias  Rhythm:regular Rate:Normal     Neuro/Psych negative neurological ROS  negative psych ROS   GI/Hepatic negative GI ROS, Neg liver ROS,,,  Endo/Other  diabetes  Class 4 obesity  Renal/GU Renal disease  negative genitourinary   Musculoskeletal   Abdominal   Peds  Hematology  (+) Blood dyscrasia, anemia   Anesthesia Other Findings   Reproductive/Obstetrics negative OB ROS                              Anesthesia Physical Anesthesia Plan  ASA: 2  Anesthesia Plan: General and General LMA   Post-op Pain Management:    Induction:   PONV Risk Score and Plan: Ondansetron   Airway Management Planned:   Additional Equipment:   Intra-op Plan:   Post-operative Plan:   Informed Consent: I have reviewed the patients History and Physical, chart, labs and discussed the procedure including the risks, benefits and alternatives for the proposed anesthesia with the patient or authorized representative who has indicated his/her understanding and acceptance.     Dental Advisory Given  Plan Discussed with: CRNA  Anesthesia Plan Comments:         Anesthesia Quick Evaluation

## 2024-01-13 NOTE — Transfer of Care (Addendum)
 Immediate Anesthesia Transfer of Care Note  Patient: Miguel Cox  Procedure(s) Performed: INSERTION, GOLD SEEDS (Prostate) INJECTION, HYDROGEL SPACER (Prostate)  Patient Location: PACU  Anesthesia Type:Regional  Level of Consciousness: awake, oriented, and drowsy  Airway & Oxygen Therapy: Patient Spontanous Breathing and Patient connected to face mask oxygen  Post-op Assessment: Report given to RN and Post -op Vital signs reviewed and stable  Post vital signs: Reviewed and stable  Last Vitals:  Vitals Value Taken Time  BP 138/86 01/13/24 13:39  Temp 36.4 01/13/24   13:39  Pulse 61 01/13/24 13:45  Resp 19 01/13/24 13:45  SpO2 100 % 01/13/24 13:45  Vitals shown include unfiled device data.  Last Pain:  Vitals:   01/13/24 1041  TempSrc: Oral  PainSc: 0-No pain      Patients Stated Pain Goal: 3 (01/13/24 1041)  Complications: No notable events documented.

## 2024-01-13 NOTE — H&P (Signed)
 HPI: [name] is a 73yo here for ffor [employer] and [employer] for prostate cancer. Biopsy revealed Gleason 3+4=7 in 7/12 cores and Gleason 3+3=6 in 1/12 cores. Prostate volume 42.7. PSA 20.2. No significant LUTS.      PMH:     Past Medical History:  Diagnosis Date   Atrial fibrillation (HCC)      Diagnosed 2020   Diverticula of intestine     DJD (degenerative joint disease)     Dysrhythmia     Essential hypertension     Gout     History of kidney stones     OSA (obstructive sleep apnea)      Pt denies, Stop Bang 5   Osteoarthritis     Polio     Type 2 diabetes mellitus (HCC)            Surgical History:      Past Surgical History:  Procedure Laterality Date   removal of toe nail        age 34 for fungus   REVERSE SHOULDER ARTHROPLASTY Left 06/12/2023    Procedure: ARTHROPLASTY, SHOULDER, TOTAL, REVERSE;  Surgeon: Kay Kemps, MD;  Location: [facility];  Service: Orthopedics;  Laterality: Left;   SHOULDER ARTHROSCOPY Right 2011    right   TOTAL HIP ARTHROPLASTY Right 07/27/2021    Procedure: TOTAL HIP ARTHROPLASTY ANTERIOR APPROACH;  Surgeon: Ernie Cough, MD;  Location: [facility];  Service: Orthopedics;  Laterality: Right;   TOTAL KNEE ARTHROPLASTY Left 01/29/2009    Dr. Duwayne          Home Medications:  Allergies as of 11/22/2023         Reactions    Enalapril Cough    Penicillins Hives, Itching    Did it involve swelling of the face/tongue/throat, SOB, or low BP? No Did it involve sudden or severe rash/hives, skin peeling, or any reaction on the inside of your mouth or nose? No Did you need to seek medical attention at a hospital or doctor's office? No When did it last happen?       If all above answers are NO, may proceed with cephalosporin use. Tolerated Cephalosporin Date: 07/27/21.    Cefazolin  Rash    Other Swelling, Rash    Pt can't eat anything from [name], it causes swelling            Medication List           Accurate as of November 22, 2023 12:58 PM. If you have any questions, ask your nurse or doctor.              PAUSE taking these medications     glipiZIDE  5 MG 24 hr tablet Wait to take this until your doctor or other care provider tells you to start again. Commonly known as: GLUCOTROL  XL Take 5 mg by mouth daily.           TAKE these medications     amLODipine  10 MG tablet Commonly known as: NORVASC  Take 10 mg by mouth daily.    atorvastatin  80 MG tablet Commonly known as: LIPITOR Take 1 tablet (80 mg total) by mouth at bedtime.    chlorthalidone  25 MG tablet Commonly known as: HYGROTON  Take 25 mg by mouth daily.    colchicine  0.6 MG tablet Take 0.6 mg by mouth daily as needed (gout). Take 2 tablets and one hour later take a third tablet as needed for gout    cyclobenzaprine  10 MG  tablet Commonly known as: FLEXERIL  Take 10 mg by mouth 3 (three) times daily as needed for muscle spasms.    EPINEPHrine  0.3 mg/0.3 mL Soaj injection Commonly known as: EpiPen  2-Pak Inject 0.3 mLs (0.3 mg total) into the muscle once.    furosemide  20 MG tablet Commonly known as: Lasix  Take 1 tablet (20 mg total) by mouth daily.    Jardiance  10 MG Tabs tablet Generic drug: empagliflozin  TAKE 1 TABLET BY MOUTH DAILY BEFORE BREAKFAST.    Krill Oil 1000 MG Caps Take 1,000 mg by mouth daily.    levofloxacin  750 MG tablet Commonly known as: Levaquin  Take 1 tablet (750 mg total) by mouth daily. Take 1 hour prior to your prostate biopsy procedure.    losartan  100 MG tablet Commonly known as: COZAAR  Take 1 tablet (100 mg total) by mouth daily.    Metoprolol  Tartrate 75 MG Tabs Take 75 mg by mouth 2 (two) times daily.    multivitamin with minerals Tabs tablet Take 1 tablet by mouth daily.    ondansetron  4 MG tablet Commonly known as: Zofran  Take 1 tablet (4 mg total) by mouth every 8 (eight) hours as needed for nausea, vomiting or refractory nausea / vomiting.    oxyCODONE -acetaminophen  5-325 MG  tablet Commonly known as: Percocet Take 1 tablet by mouth every 4 (four) hours as needed for severe pain (pain score 7-10).    sildenafil  100 MG tablet Commonly known as: VIAGRA  Take 1 tablet by mouth daily as needed.    spironolactone  25 MG tablet Commonly known as: ALDACTONE  Take 1 tablet (25 mg total) by mouth daily.    warfarin 3 MG tablet Commonly known as: COUMADIN  Take as directed by the anticoagulation clinic. If you are unsure how to take this medication, talk to your nurse or doctor. Original instructions: TAKE 2 AND 1/2 TABLETS DAILY EXCEPT 3 TABLETS ON MONDAYS OR AS DIRECTED BY COUMADIN  CLINIC             Allergies:  Allergies       Allergies  Allergen Reactions   Enalapril Cough   Penicillins Hives and Itching      Did it involve swelling of the face/tongue/throat, SOB, or low BP? No Did it involve sudden or severe rash/hives, skin peeling, or any reaction on the inside of your mouth or nose? No Did you need to seek medical attention at a hospital or doctor's office? No When did it last happen?       If all above answers are NO, may proceed with cephalosporin use. Tolerated Cephalosporin Date: 07/27/21.       Cefazolin  Rash   Other Swelling and Rash      Pt can't eat anything from [name], it causes swelling        Family History:      Family History  Problem Relation Age of Onset   Cancer Mother     Heart attack Brother 58          Social History:  reports that he has never smoked. He has never used smokeless tobacco. He reports that he does not currently use alcohol. He reports that he does not use drugs.   ROS: All other review of systems were reviewed and are negative except what is noted above in HPI   Physical Exam: There were no vitals taken for this visit.  Constitutional:  Alert and oriented, No acute distress. HEENT: [place] AT, moist mucus membranes.  Trachea midline, no masses. Cardiovascular: No  clubbing, cyanosis, or  edema. Respiratory: Normal respiratory effort, no increased work of breathing. GI: Abdomen is soft, nontender, nondistended, no abdominal masses GU: No CVA tenderness.  Lymph: No cervical or inguinal lymphadenopathy. Skin: No rashes, bruises or suspicious lesions. Neurologic: Grossly intact, no focal deficits, moving all 4 extremities. Psychiatric: Normal mood and affect.   Laboratory Data: Recent Labs       Lab Results  Component Value Date    WBC 9.8 05/29/2023    HGB 13.1 05/29/2023    HCT 42.0 05/29/2023    MCV 86.2 05/29/2023    PLT 249 05/29/2023        Recent Labs       Lab Results  Component Value Date    CREATININE 1.52 (H) 05/29/2023        Recent Labs  No results found for: PSA     Recent Labs  No results found for: TESTOSTERONE     Recent Labs       Lab Results  Component Value Date    HGBA1C 7.2 (H) 06/06/2023        Urinalysis Labs (Brief)          Component Value Date/Time    COLORURINE YELLOW 04/02/2023 1110    APPEARANCEUR Clear 10/16/2023 0934    LABSPEC 1.013 04/02/2023 1110    PHURINE 7.0 04/02/2023 1110    GLUCOSEU 3+ (A) 10/16/2023 0934    HGBUR NEGATIVE 04/02/2023 1110    BILIRUBINUR Negative 10/16/2023 0934    KETONESUR NEGATIVE 04/02/2023 1110    PROTEINUR Negative 10/16/2023 0934    PROTEINUR NEGATIVE 04/02/2023 1110    UROBILINOGEN 1.0 02/16/2010 1530    NITRITE Negative 10/16/2023 0934    NITRITE NEGATIVE 04/02/2023 1110    LEUKOCYTESUR Negative 10/16/2023 0934    LEUKOCYTESUR NEGATIVE 04/02/2023 1110        Recent Labs       Lab Results  Component Value Date    LABMICR Comment 10/16/2023        Pertinent Imaging:   No results found for this or any previous visit.   No results found for this or any previous visit.   No results found for this or any previous visit.   No results found for this or any previous visit.   No results found for this or any previous visit.   No results found for this or  any previous visit.   No results found for this or any previous visit.   No results found for this or any previous visit.     Assessment & Plan:     1.  Prostate cancer [facility]) I discussed the natural history of intermediate risk prostate cancer with the patient and the various treatment options including active surveillance, RALP, IMRT, brachytherapy, cryotherapy, HIFU and ADT. After discussing the options the patient elects for radiation therapy. The risks/benefits/alternatives for [employer] with [employer] were discussed with the patient and he understands and wishes to proceed with surgery

## 2024-01-13 NOTE — Anesthesia Procedure Notes (Signed)
 Procedure Name: LMA Insertion Date/Time: 01/13/2024 1:03 PM  Performed by: Godfrey Herring, RNPre-anesthesia Checklist: Patient identified, Emergency Drugs available, Suction available and Patient being monitored Oxygen Delivery Method: Circle system utilized Preoxygenation: Pre-oxygenation with 100% oxygen Induction Type: IV induction Ventilation: Two handed mask ventilation required LMA: LMA inserted LMA Size: 4.0 Number of attempts: 2 Airway Equipment and Method: Bite block Placement Confirmation: breath sounds checked- equal and bilateral and positive ETCO2 Dental Injury: Teeth and Oropharynx as per pre-operative assessment

## 2024-01-14 ENCOUNTER — Encounter (HOSPITAL_COMMUNITY): Payer: Self-pay | Admitting: Urology

## 2024-01-17 NOTE — Anesthesia Postprocedure Evaluation (Signed)
"   Anesthesia Post Note  Patient: Miguel Cox  Procedure(s) Performed: INSERTION, GOLD SEEDS (Prostate) INJECTION, HYDROGEL SPACER (Prostate)  Patient location during evaluation: Phase II Anesthesia Type: General Level of consciousness: awake Pain management: pain level controlled Vital Signs Assessment: post-procedure vital signs reviewed and stable Respiratory status: spontaneous breathing and respiratory function stable Cardiovascular status: blood pressure returned to baseline and stable Postop Assessment: no headache and no apparent nausea or vomiting Anesthetic complications: no Comments: Late entry   No notable events documented.   Last Vitals:  Vitals:   01/13/24 1415 01/13/24 1423  BP: 133/77 121/79  Pulse: 100 (!) 51  Resp: 17 15  Temp: (!) 36.4 C (!) 36.4 C  SpO2: 99% 98%    Last Pain:  Vitals:   01/13/24 1423  TempSrc: Oral  PainSc: 0-No pain                 Miguel Cox Miguel Cox      "

## 2024-01-27 ENCOUNTER — Ambulatory Visit: Attending: Internal Medicine | Admitting: *Deleted

## 2024-01-27 DIAGNOSIS — Z5181 Encounter for therapeutic drug level monitoring: Secondary | ICD-10-CM | POA: Insufficient documentation

## 2024-01-27 DIAGNOSIS — I4819 Other persistent atrial fibrillation: Secondary | ICD-10-CM | POA: Insufficient documentation

## 2024-01-27 LAB — POCT INR: INR: 3.2 — AB (ref 2.0–3.0)

## 2024-01-27 NOTE — Patient Instructions (Signed)
 Take warfarin 1 tablet tonight then decrease dose to 2 tablets daily except 2 1/2 tablets on Sundays, Tuesdays and Thursdays Be consistent with Vit K foods Recheck  3 wk after procedure

## 2024-01-27 NOTE — Progress Notes (Signed)
 INR 3.2 Please see anticoagulation encounter

## 2024-02-05 ENCOUNTER — Ambulatory Visit: Admitting: Urology

## 2024-02-17 ENCOUNTER — Ambulatory Visit: Admitting: *Deleted

## 2024-02-17 DIAGNOSIS — I4819 Other persistent atrial fibrillation: Secondary | ICD-10-CM | POA: Diagnosis present

## 2024-02-17 DIAGNOSIS — Z5181 Encounter for therapeutic drug level monitoring: Secondary | ICD-10-CM | POA: Insufficient documentation

## 2024-02-17 LAB — POCT INR: INR: 3.9 — AB (ref 2.0–3.0)

## 2024-02-17 NOTE — Progress Notes (Signed)
 INR 3.9. Please see anticoagulation encounter

## 2024-02-17 NOTE — Patient Instructions (Signed)
 Hold warfarin tonight then decrease dose to 2 tablets daily  Be consistent with Vit K foods Recheck  3 wk

## 2024-03-09 ENCOUNTER — Ambulatory Visit

## 2024-04-23 ENCOUNTER — Ambulatory Visit: Admitting: Internal Medicine
